# Patient Record
Sex: Male | Born: 1990 | Hispanic: Yes | Smoking: Never smoker
Health system: Southern US, Community
[De-identification: ages and names within clinical notes are randomized; demographics above are authoritative.]

## PROBLEM LIST (undated history)

## (undated) DIAGNOSIS — F32A Depression, unspecified: Secondary | ICD-10-CM

## (undated) DIAGNOSIS — E559 Vitamin D deficiency, unspecified: Secondary | ICD-10-CM

## (undated) DIAGNOSIS — G629 Polyneuropathy, unspecified: Secondary | ICD-10-CM

## (undated) DIAGNOSIS — I10 Essential (primary) hypertension: Secondary | ICD-10-CM

## (undated) DIAGNOSIS — F329 Major depressive disorder, single episode, unspecified: Secondary | ICD-10-CM

## (undated) DIAGNOSIS — E119 Type 2 diabetes mellitus without complications: Secondary | ICD-10-CM

## (undated) DIAGNOSIS — J45909 Unspecified asthma, uncomplicated: Secondary | ICD-10-CM

## (undated) DIAGNOSIS — K219 Gastro-esophageal reflux disease without esophagitis: Secondary | ICD-10-CM

## (undated) DIAGNOSIS — F419 Anxiety disorder, unspecified: Secondary | ICD-10-CM

## (undated) HISTORY — DX: Polyneuropathy, unspecified: G62.9

## (undated) HISTORY — DX: Vitamin D deficiency, unspecified: E55.9

## (undated) HISTORY — DX: Essential (primary) hypertension: I10

## (undated) HISTORY — DX: Anxiety disorder, unspecified: F41.9

## (undated) HISTORY — PX: KNEE ARTHROPLASTY: SHX992

## (undated) HISTORY — PX: TONSILLECTOMY: SUR1361

## (undated) HISTORY — DX: Unspecified asthma, uncomplicated: J45.909

## (undated) HISTORY — DX: Gastro-esophageal reflux disease without esophagitis: K21.9

---
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
## 2011-01-04 ENCOUNTER — Encounter: Payer: Self-pay | Admitting: Unknown Physician Specialty

## 2011-01-06 ENCOUNTER — Encounter: Payer: Self-pay | Admitting: Internal Medicine

## 2011-01-13 NOTE — Letter (Signed)
Summary: INTERNAL TRANSFER FROM [facility]  INTERNAL TRANSFER FROM [facility]   Imported By: Arta Bruce 11/21/2010 11:43:48  _____________________________________________________________________  External Attachment:    Type:   Image     Comment:   External Document

## 2011-01-13 NOTE — Consult Note (Signed)
Summary: [facility]  [facility]   Imported By: Marylou Mccoy 01/22/2010 11:47:19  _____________________________________________________________________  External Attachment:    Type:   Image     Comment:   External Document

## 2011-01-21 NOTE — Medication Information (Signed)
Summary: [facility] / [facility]  [facility] / [facility]   Imported By: Lennie Odor 01/13/2011 15:50:16  _____________________________________________________________________  External Attachment:    Type:   Image     Comment:   External Document

## 2011-03-01 LAB — CK TOTAL AND CKMB (NOT AT ARMC)
CK, MB: 2.6 ng/mL (ref 0.3–4.0)
CK, MB: 3.3 ng/mL (ref 0.3–4.0)
Relative Index: 1 (ref 0.0–2.5)
Relative Index: 1 (ref 0.0–2.5)
Relative Index: 1.2 (ref 0.0–2.5)
Total CK: 219 U/L (ref 7–232)
Total CK: 323 U/L — ABNORMAL HIGH (ref 7–232)
Total CK: 407 U/L — ABNORMAL HIGH (ref 7–232)

## 2011-03-01 LAB — LIPID PANEL
HDL: 42 mg/dL (ref >34)
LDL Cholesterol: 93 mg/dL (ref 0–109)
Total CHOL/HDL Ratio: 3.9 RATIO
Triglycerides: 145 mg/dL (ref <150)
VLDL: 29 mg/dL (ref 0–40)

## 2011-03-01 LAB — DIFFERENTIAL
Basophils Absolute: 0 10*3/uL (ref 0.0–0.1)
Basophils Relative: 0 % (ref 0–1)
Basophils Relative: 1 % (ref 0–1)
Eosinophils Absolute: 0.5 10*3/uL (ref 0.0–1.2)
Eosinophils Relative: 6 % — ABNORMAL HIGH (ref 0–5)
Eosinophils Relative: 7 % — ABNORMAL HIGH (ref 0–5)
Lymphocytes Relative: 32 % (ref 24–48)
Lymphs Abs: 2.5 10*3/uL (ref 1.1–4.8)
Monocytes Absolute: 0.6 10*3/uL (ref 0.2–1.2)
Monocytes Absolute: 0.7 10*3/uL (ref 0.2–1.2)
Monocytes Relative: 6 % (ref 3–11)
Monocytes Relative: 9 % (ref 3–11)
Neutro Abs: 4.1 10*3/uL (ref 1.7–8.0)
Neutro Abs: 7.8 10*3/uL (ref 1.7–8.0)
Neutrophils Relative %: 53 % (ref 43–71)

## 2011-03-01 LAB — CBC
HCT: 41.3 % (ref 36.0–49.0)
HCT: 44.7 % (ref 36.0–49.0)
Hemoglobin: 14.1 g/dL (ref 12.0–16.0)
MCHC: 34.3 g/dL (ref 31.0–37.0)
MCV: 81.5 fL (ref 78.0–98.0)
Platelets: 364 10*3/uL (ref 150–400)
Platelets: 378 10*3/uL (ref 150–400)
RBC: 5.06 MIL/uL (ref 3.80–5.70)
RDW: 13.6 % (ref 11.4–15.5)
RDW: 13.8 % (ref 11.4–15.5)
WBC: 10.8 10*3/uL (ref 4.5–13.5)
WBC: 7.8 10*3/uL (ref 4.5–13.5)

## 2011-03-01 LAB — COMPREHENSIVE METABOLIC PANEL
AST: 31 U/L (ref 0–37)
Albumin: 4.7 g/dL (ref 3.5–5.2)
Alkaline Phosphatase: 86 U/L (ref 52–171)
BUN: 20 mg/dL (ref 6–23)
Chloride: 105 mEq/L (ref 96–112)
Potassium: 4.3 mEq/L (ref 3.5–5.1)
Sodium: 139 mEq/L (ref 135–145)
Total Bilirubin: 1 mg/dL (ref 0.3–1.2)
Total Protein: 7.6 g/dL (ref 6.0–8.3)

## 2011-03-01 LAB — RAPID URINE DRUG SCREEN, HOSP PERFORMED
Amphetamines: NOT DETECTED
Barbiturates: NOT DETECTED
Benzodiazepines: NOT DETECTED
Cocaine: NOT DETECTED
Opiates: NOT DETECTED
Tetrahydrocannabinol: NOT DETECTED

## 2011-03-01 LAB — D-DIMER, QUANTITATIVE: D-Dimer, Quant: <0.22 ug/mL-FEU (ref 0.00–0.48)

## 2011-03-01 LAB — TSH: TSH: 3.613 u[IU]/mL (ref 0.700–6.400)

## 2011-03-01 LAB — TROPONIN I
Troponin I: 0.01 ng/mL (ref 0.00–0.06)
Troponin I: 0.03 ng/mL (ref 0.00–0.06)
Troponin I: 0.05 ng/mL (ref 0.00–0.06)

## 2011-03-15 ENCOUNTER — Emergency Department (HOSPITAL_COMMUNITY)
Admission: EM | Admit: 2011-03-15 | Discharge: 2011-03-15 | Disposition: A | Discharge status: Home / Self Care | Payer: Medicaid Other | Attending: Emergency Medicine | Admitting: Emergency Medicine

## 2011-03-15 ENCOUNTER — Emergency Department (HOSPITAL_COMMUNITY): Payer: Medicaid Other

## 2011-03-15 DIAGNOSIS — Y9361 Activity, american tackle football: Secondary | ICD-10-CM | POA: Insufficient documentation

## 2011-03-15 DIAGNOSIS — X500XXA Overexertion from strenuous movement or load, initial encounter: Secondary | ICD-10-CM | POA: Insufficient documentation

## 2011-03-15 DIAGNOSIS — M25569 Pain in unspecified knee: Secondary | ICD-10-CM | POA: Insufficient documentation

## 2011-03-15 DIAGNOSIS — S83106A Unspecified dislocation of unspecified knee, initial encounter: Secondary | ICD-10-CM | POA: Insufficient documentation

## 2011-03-24 ENCOUNTER — Other Ambulatory Visit (HOSPITAL_COMMUNITY): Payer: Self-pay | Admitting: Orthopedic Surgery

## 2011-03-24 DIAGNOSIS — M25562 Pain in left knee: Secondary | ICD-10-CM

## 2011-03-27 ENCOUNTER — Ambulatory Visit (HOSPITAL_COMMUNITY)
Admission: RE | Admit: 2011-03-27 | Discharge: 2011-03-27 | Disposition: A | Discharge status: Home / Self Care | Payer: Medicaid Other | Source: Ambulatory Visit | Attending: Orthopedic Surgery | Admitting: Orthopedic Surgery

## 2011-03-27 DIAGNOSIS — S83289A Other tear of lateral meniscus, current injury, unspecified knee, initial encounter: Secondary | ICD-10-CM | POA: Insufficient documentation

## 2011-03-27 DIAGNOSIS — S83509A Sprain of unspecified cruciate ligament of unspecified knee, initial encounter: Secondary | ICD-10-CM | POA: Insufficient documentation

## 2011-03-27 DIAGNOSIS — M25469 Effusion, unspecified knee: Secondary | ICD-10-CM | POA: Insufficient documentation

## 2011-03-27 DIAGNOSIS — Y9361 Activity, american tackle football: Secondary | ICD-10-CM | POA: Insufficient documentation

## 2011-03-27 DIAGNOSIS — M25569 Pain in unspecified knee: Secondary | ICD-10-CM | POA: Insufficient documentation

## 2011-03-27 DIAGNOSIS — W219XXA Striking against or struck by unspecified sports equipment, initial encounter: Secondary | ICD-10-CM | POA: Insufficient documentation

## 2011-03-27 DIAGNOSIS — M25562 Pain in left knee: Secondary | ICD-10-CM

## 2011-04-06 ENCOUNTER — Other Ambulatory Visit: Payer: Self-pay | Admitting: Orthopedic Surgery

## 2011-04-06 ENCOUNTER — Ambulatory Visit (HOSPITAL_COMMUNITY)
Admission: RE | Admit: 2011-04-06 | Discharge: 2011-04-06 | Disposition: A | Discharge status: Home / Self Care | Payer: Medicaid Other | Source: Ambulatory Visit | Attending: Orthopedic Surgery | Admitting: Orthopedic Surgery

## 2011-04-06 ENCOUNTER — Encounter (HOSPITAL_COMMUNITY)
Admission: RE | Admit: 2011-04-06 | Discharge: 2011-04-06 | Disposition: A | Discharge status: Home / Self Care | Payer: Medicaid Other | Source: Ambulatory Visit | Attending: Orthopedic Surgery | Admitting: Orthopedic Surgery

## 2011-04-06 DIAGNOSIS — M25562 Pain in left knee: Secondary | ICD-10-CM

## 2011-04-06 DIAGNOSIS — Z01811 Encounter for preprocedural respiratory examination: Secondary | ICD-10-CM | POA: Insufficient documentation

## 2011-04-06 DIAGNOSIS — Z01818 Encounter for other preprocedural examination: Secondary | ICD-10-CM | POA: Insufficient documentation

## 2011-04-06 DIAGNOSIS — M25569 Pain in unspecified knee: Secondary | ICD-10-CM | POA: Insufficient documentation

## 2011-04-06 DIAGNOSIS — Z01812 Encounter for preprocedural laboratory examination: Secondary | ICD-10-CM | POA: Insufficient documentation

## 2011-04-06 LAB — CBC
HCT: 40.2 % (ref 39.0–52.0)
MCHC: 33.6 g/dL (ref 30.0–36.0)
MCV: 81.7 fL (ref 78.0–100.0)
Platelets: 367 10*3/uL (ref 150–400)
RDW: 12.9 % (ref 11.5–15.5)
WBC: 8.6 10*3/uL (ref 4.0–10.5)

## 2011-04-06 LAB — BASIC METABOLIC PANEL
BUN: 18 mg/dL (ref 6–23)
CO2: 29 mEq/L (ref 19–32)
Chloride: 107 mEq/L (ref 96–112)
Creatinine, Ser: 1.11 mg/dL (ref 0.4–1.5)
Glucose, Bld: 120 mg/dL — ABNORMAL HIGH (ref 70–99)
Potassium: 4.5 mEq/L (ref 3.5–5.1)

## 2011-04-06 LAB — APTT: aPTT: 30 seconds (ref 24–37)

## 2011-04-06 LAB — SURGICAL PCR SCREEN
MRSA, PCR: NEGATIVE
Staphylococcus aureus: NEGATIVE

## 2011-04-06 LAB — TYPE AND SCREEN: ABO/RH(D): O POS

## 2011-04-06 LAB — ABO/RH: ABO/RH(D): O POS

## 2011-04-07 ENCOUNTER — Observation Stay (HOSPITAL_COMMUNITY)
Admission: RE | Admit: 2011-04-07 | Discharge: 2011-04-09 | Disposition: A | Discharge status: Home / Self Care | Payer: Medicaid Other | Source: Ambulatory Visit | Attending: Orthopedic Surgery | Admitting: Orthopedic Surgery

## 2011-04-07 DIAGNOSIS — S83509A Sprain of unspecified cruciate ligament of unspecified knee, initial encounter: Principal | ICD-10-CM | POA: Insufficient documentation

## 2011-04-07 DIAGNOSIS — X58XXXA Exposure to other specified factors, initial encounter: Secondary | ICD-10-CM | POA: Insufficient documentation

## 2011-04-07 DIAGNOSIS — M2240 Chondromalacia patellae, unspecified knee: Secondary | ICD-10-CM | POA: Insufficient documentation

## 2011-04-07 DIAGNOSIS — S83289A Other tear of lateral meniscus, current injury, unspecified knee, initial encounter: Secondary | ICD-10-CM | POA: Insufficient documentation

## 2011-04-18 ENCOUNTER — Emergency Department (HOSPITAL_COMMUNITY)
Admission: EM | Admit: 2011-04-18 | Discharge: 2011-04-19 | Disposition: A | Discharge status: Home / Self Care | Payer: Medicaid Other | Attending: Emergency Medicine | Admitting: Emergency Medicine

## 2011-04-18 DIAGNOSIS — F29 Unspecified psychosis not due to a substance or known physiological condition: Secondary | ICD-10-CM | POA: Insufficient documentation

## 2011-04-18 LAB — COMPREHENSIVE METABOLIC PANEL
ALT: 46 U/L (ref 0–53)
Albumin: 4 g/dL (ref 3.5–5.2)
Alkaline Phosphatase: 104 U/L (ref 39–117)
Calcium: 9.1 mg/dL (ref 8.4–10.5)
GFR calc Af Amer: >60 mL/min (ref >60)
Glucose, Bld: 153 mg/dL — ABNORMAL HIGH (ref 70–99)
Potassium: 4 mEq/L (ref 3.5–5.1)
Sodium: 139 mEq/L (ref 135–145)
Total Protein: 6.9 g/dL (ref 6.0–8.3)

## 2011-04-18 LAB — DIFFERENTIAL
Eosinophils Absolute: 0.6 10*3/uL (ref 0.0–0.7)
Eosinophils Relative: 8 % — ABNORMAL HIGH (ref 0–5)
Lymphocytes Relative: 40 % (ref 12–46)
Lymphs Abs: 2.7 10*3/uL (ref 0.7–4.0)
Monocytes Relative: 9 % (ref 3–12)

## 2011-04-18 LAB — CBC
HCT: 38.4 % — ABNORMAL LOW (ref 39.0–52.0)
MCH: 26.7 pg (ref 26.0–34.0)
MCV: 81.4 fL (ref 78.0–100.0)
Platelets: 435 10*3/uL — ABNORMAL HIGH (ref 150–400)
RBC: 4.72 MIL/uL (ref 4.22–5.81)
RDW: 12.5 % (ref 11.5–15.5)
WBC: 6.8 10*3/uL (ref 4.0–10.5)

## 2011-04-18 LAB — ETHANOL: Alcohol, Ethyl (B): <11 mg/dL — ABNORMAL HIGH (ref 0–10)

## 2011-04-19 LAB — RAPID URINE DRUG SCREEN, HOSP PERFORMED
Benzodiazepines: NOT DETECTED
Cocaine: NOT DETECTED
Tetrahydrocannabinol: NOT DETECTED

## 2011-04-21 NOTE — Op Note (Signed)
NAME:  Ernest Mccann, Ernest Mccann NO.:  [id number]  MEDICAL RECORD NO.:  [id number]           PATIENT TYPE:  I  LOCATION:  5005                         FACILITY:  [facility]  PHYSICIAN:  Burnard Bunting, M.D.    DATE OF BIRTH:  Sep 19, 1991  DATE OF PROCEDURE:  04/07/2011 DATE OF DISCHARGE:  04/06/2011                              OPERATIVE REPORT   PREOPERATIVE DIAGNOSES:  Left knee anterior cruciate ligament tear, medial femoral condyle, chondromalacia, lateral meniscal tear.  POSTOPERATIVE DIAGNOSES:  Left knee anterior cruciate ligament tear, medial femoral condyle, chondromalacia, lateral meniscal tear.  PROCEDURE: 1. Left knee ACL reconstruction with hamstring and autograft. 2. Chondroplasty medial femoral condyle. 3. Near complete partial lateral meniscectomy.  SURGEON:  Burnard Bunting, MD  ASSISTANT:  Wende Neighbors, PA  ANESTHESIA:  General endotracheal.  ESTIMATED BLOOD LOSS:  Minimal.  INDICATIONS:  Ernest Mccann is a 20 year old patient with left knee pain following an injury.  He presents now for operative management of ACL deficiency and symptomatic instability after explanation of risks and benefits.  OPERATIVE FINDINGS: 1. Examination under anesthesia range of motion 5-135 degrees of     flexion.  The patient noted stability to varus and valgus stress at     0 and 30 degrees.  ACL without posterolateral rotatory instability     is noted. 2. Diagnostic arthroscopy.     a.     No loose bodies in medial lateral gutter.     b.     Intact patellofemoral joint. 3. Torn ACL, intact PCL. 4. Chondromalacia with loose chondral flaps, grade 3 on the medial     femoral condyle over about a dime sized area. 5. Chondromalacia also on the lateral femoral condyle with no loose     chondral flaps but with a significant tear of the posterior and     lateral meniscus involving 90% anterior-posterior width of the     meniscus posterior to the popliteus  tendon.  PROCEDURE IN DETAIL:  The patient was brought to operating room where general endotracheal anesthesia was induced.  Preoperative IV antibiotics were administered.  Time-out was called.  Left leg, knee, and foot was prepped with DuraPrep solution and draped in sterile manner.  Knee was prescrubbed with alcohol and Betadine which allowed to air dry and then prepped and then draped and an [place] was used to cover the operative field.  Leg was elevated and exsanguinated with an Esmarch wrap.  Tourniquet was inflated.  Incision was made from the medial aspect of the tibial tubercle extending about 3 cm.  Skin and subcutaneous tissues were sharply divided.  The semitendinosus and gracilis tendons were identified and harvested.  Care was taken to avoid injury to the saphenous nerve which was visualized.  Following harvest with the tendon harvest, Maud Deed prepared the graft on the back table to a length of 205.  Meanwhile this incision was thoroughly irrigated and packed with a wet sponge.  Anterior-inferior medial port established.  Diagnostic arthroscopy was performed.  ACL was torn. Chondral flaps on the medial femoral condyle were debrided.  There was no full-thickness chondral defect on  the medial femoral condyle.  Medial meniscus was intact.  No peripheral tear was noted upon probing and visualizing the posterior compartment.  ACL was torn.  It was debrided. Notchplasty was performed.  Over-the-top position was identified.  ACL stump was also debrided.  This was blocking in extension.  The lateral meniscus was visualized, has significant tear on the posterior portion, irreparable.  This was debrided back to stable rim using combination basket punch, shaver, and portal reversal.  All-in-all about 90% of the anterior-posterior width of the meniscus was involved focally in this tear over 1.5 cm area.  Following meniscal debridement and notchplasty and identification of the notch  landmarks, the flip cutter was used to drill the femoral tunnel and at about the 3 o'clock position on the lateral femoral condyle, 8.5 flip cutter was chosen.  Tibia was then also drilled with the flip cutter 8.5 mm and then subsequently dilated as well.  The graft was then passed and secured with the button on the femoral side.  Then over post with the knee in 10 degrees of extension on the tibia.  The patient had excellent stability of the knee. Following fixation, knee joint was then thoroughly irrigated. Tourniquet was released after about half an hour and 40 minutes. Bleeding points encountered, controlled using ArthroCare 1.  The instruments were then removed after thorough irrigation.  Harvest site incision was closed using 0 Vicryl suture, 2-0 Vicryl suture, and running 3-0 pullout Prolene.  Portal incisions were closed using 3-0 nylon suture.  Solution of Marcaine and morphine finally injected into the knee for postop pain control.  Bulky dressing, ice wrap, and knee immobilizer was placed.  The patient tolerated the procedure well.  He was transferred to the recovery room in stable condition.  [name]'s assistance was required all times during the case for opening and closing, graft preparation, graft passage, limb positioning, retraction of important neurovascular structures.  Her assistance was a medical necessity.     Burnard Bunting, M.D.     GSD/MEDQ  D:  04/07/2011  T:  04/08/2011  Job:  [id number]  Electronically Signed by Reece Agar.  Shandrika Ambers M.D. on 04/21/2011 01:31:41 PM

## 2011-04-22 NOTE — Discharge Summary (Signed)
  NAME:  Ernest Mccann, Ernest Mccann           ACCOUNT NO.:  [id number]  MEDICAL RECORD NO.:  [id number]           PATIENT TYPE:  O  LOCATION:  5005                         FACILITY:  [facility]  PHYSICIAN:  Burnard Bunting, M.D.    DATE OF BIRTH:  1991/09/01  DATE OF ADMISSION:  04/07/2011 DATE OF DISCHARGE:  04/09/2011                              DISCHARGE SUMMARY   DISCHARGE DIAGNOSIS:  Left knee anterior cruciate ligament tear, meniscal tears  OPERATIONS AND NOTABLE PROCEDURES:  Left knee ACL reconstruction with partial medial lateral meniscectomy on performed April 07, 2011.  HOSPITAL COURSE:  Ernest Mccann is a 20 year old patient who underwent left knee ACL reconstruction.  He tolerated the procedure well.  He was just slow to mobilize on postop day #1 and had pain control issues.  IV pain medicine was maintained during postop day #1, discontinued on postop day #2.  Started on aspirin for DVT prophylaxis, was mobilizing physical therapy by the time of discharge.  CPM was initiated in the hospital.  Physical therapy was initiated in the hospital, had an otherwise unremarkable recovery.  Incision was intact on postop day #2.  He is discharged home in good condition, weightbearing as tolerated in left lower extremity with crutches.  He will have CPM machine at home as well as home health physical therapy. Discharge medications include aspirin 325 mg p.o. daily, Percocet 5/325 one to two p.o. q.3-4 h. p.r.n. pain, Robaxin 500 mg p.o. q.8 h. p.r.n. spasm.  He will follow up with me in 7 days.     Burnard Bunting, M.D.     GSD/MEDQ  D:  04/21/2011  T:  04/22/2011  Job:  [id number]  Electronically Signed by Reece Agar.  Younique Casad M.D. on 04/22/2011 01:07:14 PM

## 2011-05-20 ENCOUNTER — Ambulatory Visit: Payer: Medicaid Other | Attending: Orthopedic Surgery | Admitting: Physical Therapy

## 2011-05-20 DIAGNOSIS — R262 Difficulty in walking, not elsewhere classified: Secondary | ICD-10-CM | POA: Insufficient documentation

## 2011-05-20 DIAGNOSIS — R5381 Other malaise: Secondary | ICD-10-CM | POA: Insufficient documentation

## 2011-05-20 DIAGNOSIS — M25569 Pain in unspecified knee: Secondary | ICD-10-CM | POA: Insufficient documentation

## 2011-05-20 DIAGNOSIS — IMO0001 Reserved for inherently not codable concepts without codable children: Secondary | ICD-10-CM | POA: Insufficient documentation

## 2011-05-20 DIAGNOSIS — M25669 Stiffness of unspecified knee, not elsewhere classified: Secondary | ICD-10-CM | POA: Insufficient documentation

## 2011-06-01 ENCOUNTER — Ambulatory Visit: Payer: Medicaid Other | Admitting: Physical Therapy

## 2011-06-03 ENCOUNTER — Ambulatory Visit: Payer: Medicaid Other | Admitting: Physical Therapy

## 2011-06-08 ENCOUNTER — Ambulatory Visit: Payer: Medicaid Other | Admitting: Physical Therapy

## 2011-06-10 ENCOUNTER — Ambulatory Visit: Payer: Medicaid Other | Admitting: Physical Therapy

## 2011-06-14 ENCOUNTER — Emergency Department (HOSPITAL_COMMUNITY)
Admission: EM | Admit: 2011-06-14 | Discharge: 2011-06-14 | Disposition: A | Discharge status: Home / Self Care | Payer: Medicaid Other | Attending: Emergency Medicine | Admitting: Emergency Medicine

## 2011-06-14 ENCOUNTER — Emergency Department (HOSPITAL_COMMUNITY): Payer: Medicaid Other

## 2011-06-14 DIAGNOSIS — J3489 Other specified disorders of nose and nasal sinuses: Secondary | ICD-10-CM | POA: Insufficient documentation

## 2011-06-14 DIAGNOSIS — H9209 Otalgia, unspecified ear: Secondary | ICD-10-CM | POA: Insufficient documentation

## 2011-06-14 DIAGNOSIS — IMO0001 Reserved for inherently not codable concepts without codable children: Secondary | ICD-10-CM | POA: Insufficient documentation

## 2011-06-14 DIAGNOSIS — R51 Headache: Secondary | ICD-10-CM | POA: Insufficient documentation

## 2011-06-14 DIAGNOSIS — J40 Bronchitis, not specified as acute or chronic: Secondary | ICD-10-CM | POA: Insufficient documentation

## 2011-06-14 DIAGNOSIS — R05 Cough: Secondary | ICD-10-CM | POA: Insufficient documentation

## 2011-06-14 DIAGNOSIS — R5381 Other malaise: Secondary | ICD-10-CM | POA: Insufficient documentation

## 2011-06-14 DIAGNOSIS — R059 Cough, unspecified: Secondary | ICD-10-CM | POA: Insufficient documentation

## 2011-06-24 ENCOUNTER — Ambulatory Visit: Payer: Medicaid Other | Admitting: Physical Therapy

## 2011-06-25 ENCOUNTER — Encounter: Payer: Medicaid Other | Admitting: Physical Therapy

## 2011-06-29 ENCOUNTER — Ambulatory Visit: Payer: Medicaid Other | Attending: Orthopedic Surgery | Admitting: Physical Therapy

## 2011-06-29 DIAGNOSIS — IMO0001 Reserved for inherently not codable concepts without codable children: Secondary | ICD-10-CM | POA: Insufficient documentation

## 2011-06-29 DIAGNOSIS — M25569 Pain in unspecified knee: Secondary | ICD-10-CM | POA: Insufficient documentation

## 2011-06-29 DIAGNOSIS — R5381 Other malaise: Secondary | ICD-10-CM | POA: Insufficient documentation

## 2011-06-29 DIAGNOSIS — R262 Difficulty in walking, not elsewhere classified: Secondary | ICD-10-CM | POA: Insufficient documentation

## 2011-06-29 DIAGNOSIS — M25669 Stiffness of unspecified knee, not elsewhere classified: Secondary | ICD-10-CM | POA: Insufficient documentation

## 2011-07-01 ENCOUNTER — Encounter: Payer: Medicaid Other | Admitting: Physical Therapy

## 2011-09-07 LAB — POCT URINALYSIS DIP (DEVICE)
Glucose, UA: NEGATIVE
Operator id: 208841
Protein, ur: NEGATIVE
Urobilinogen, UA: 0.2

## 2011-10-22 IMAGING — CT CT ANGIO CHEST
2 of 6 series · 19 of 36 positions shown · IV contrast (APPLIED)
Comparison: None.

CLINICAL DATA: Left-sided chest pain and shortness of breath.

CT ANGIOGRAPHY CHEST WITH CONTRAST
TECHNIQUE: Multidetector CT imaging of the chest was performed
using the standard protocol during bolus administration of
intravenous contrast.  Multiplanar CT image reconstructions
including MIPs were obtained to evaluate the vascular anatomy.
Contrast:  100 ml Omnipaque 300

[Series 9: pulm embolism 1.0 b25f thins · axial · 0.79mm/px · z∈[+349,+577]mm · 18 of 254 slices shown]
[im 13/254  lung]
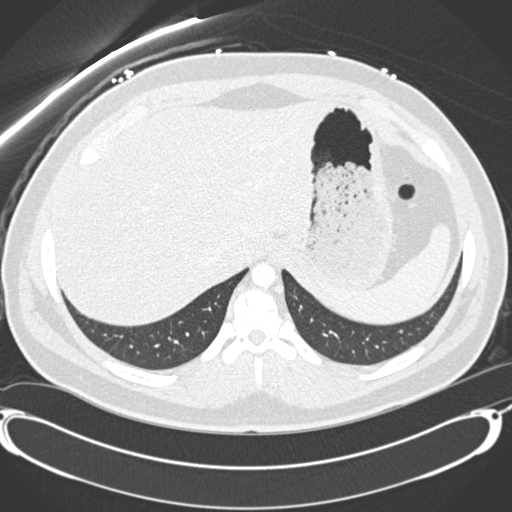
[im 26/254  mediastinal]
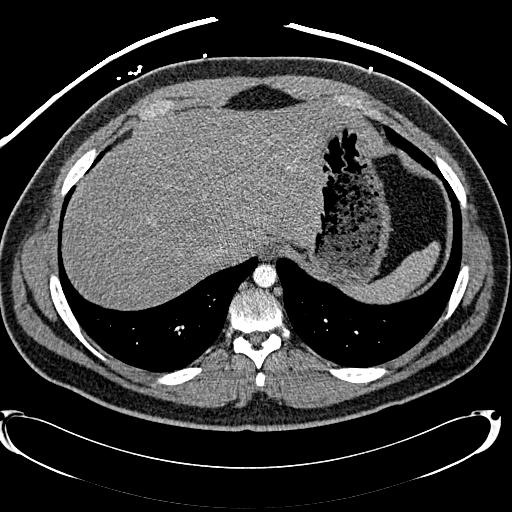
[im 38/254  lung]
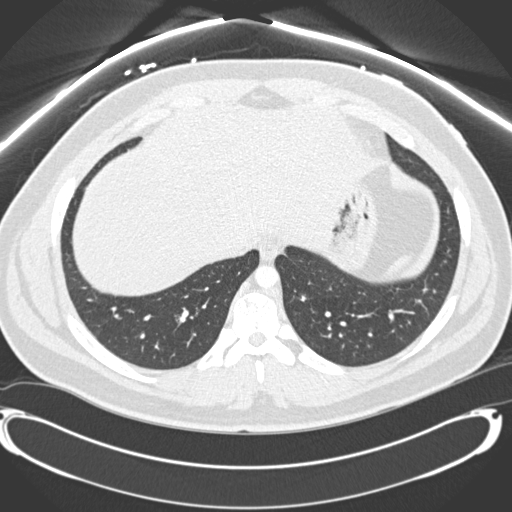
[im 51/254  mediastinal]
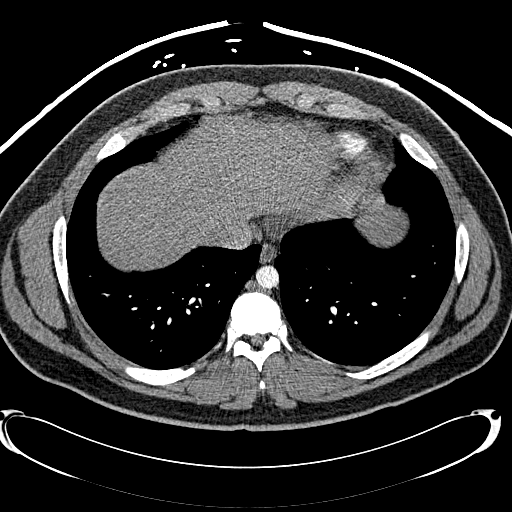
[im 64/254  lung]
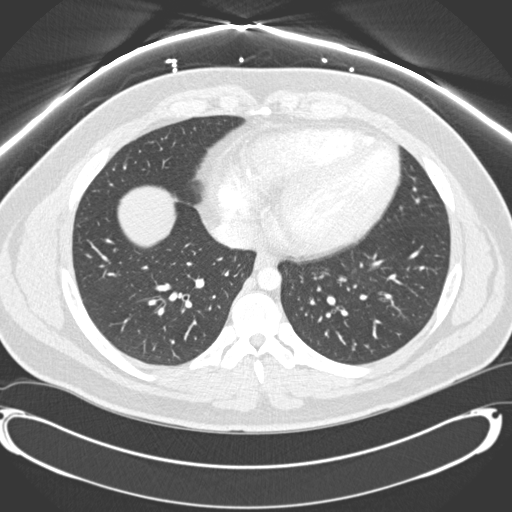
[im 76/254  mediastinal]
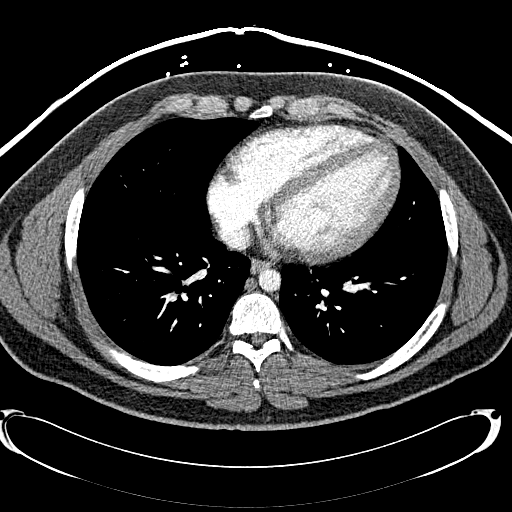
[im 89/254  lung]
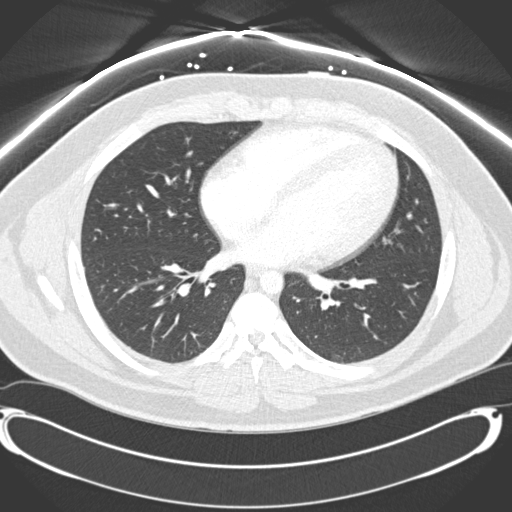
[im 102/254  mediastinal]
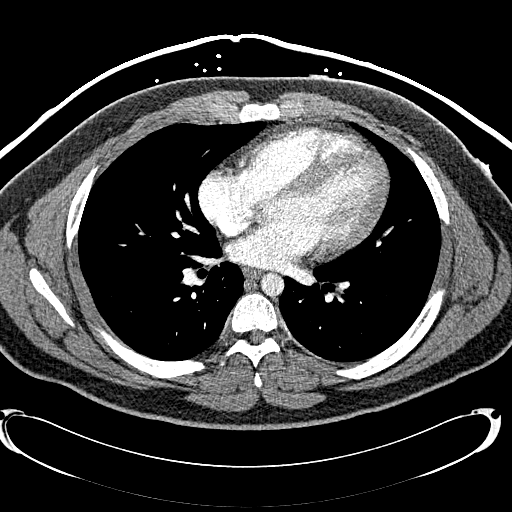
[im 114/254  lung]
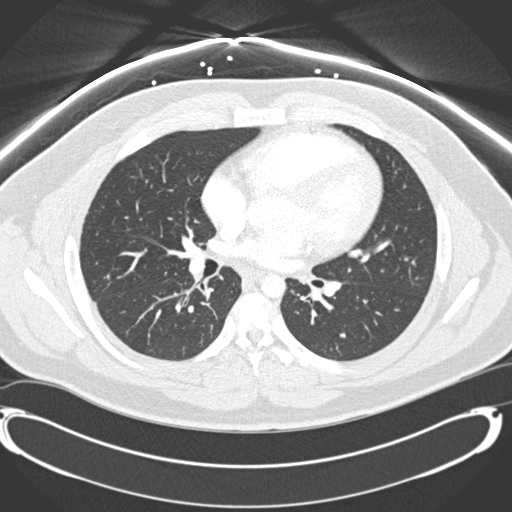
[im 140/254  mediastinal]
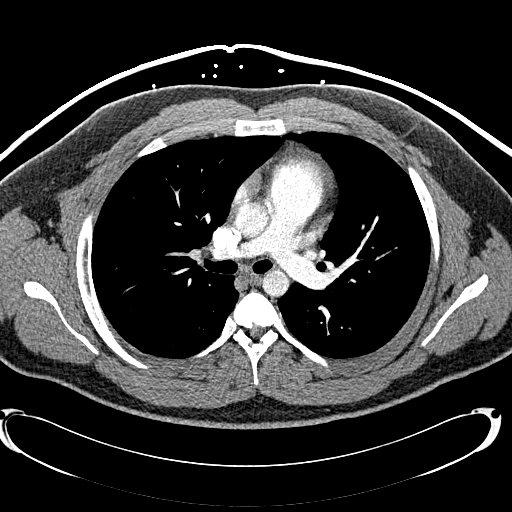
[im 152/254  lung]
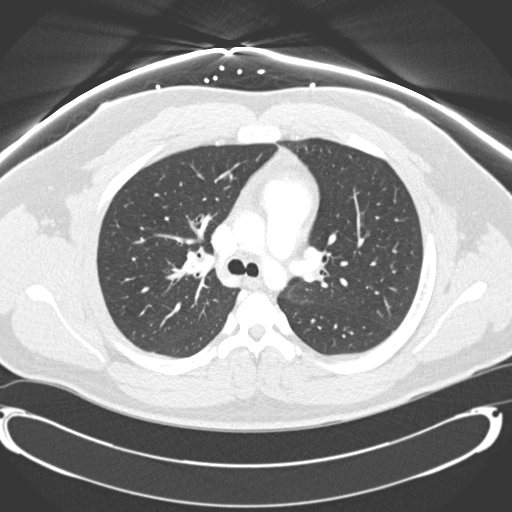
[im 165/254  mediastinal]
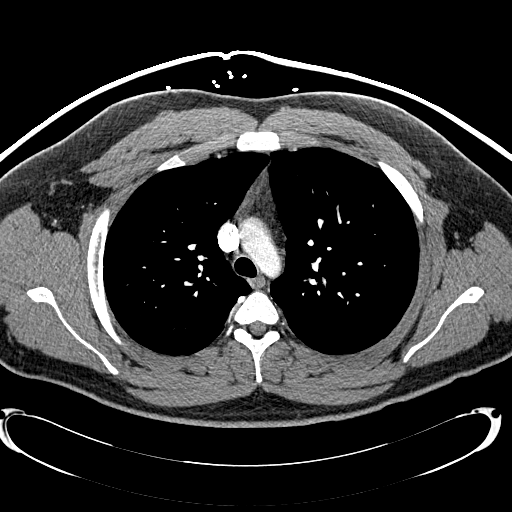
[im 178/254  lung]
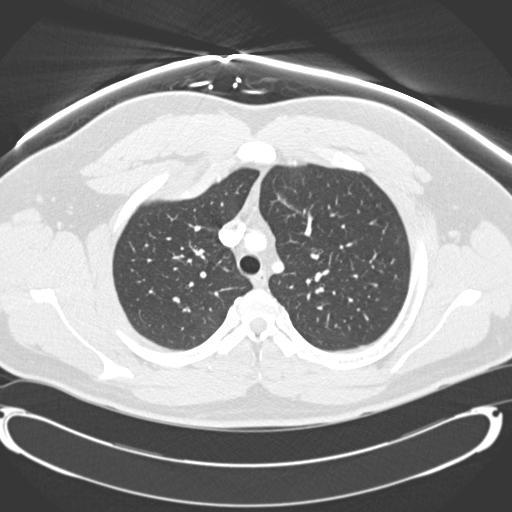
[im 190/254  mediastinal]
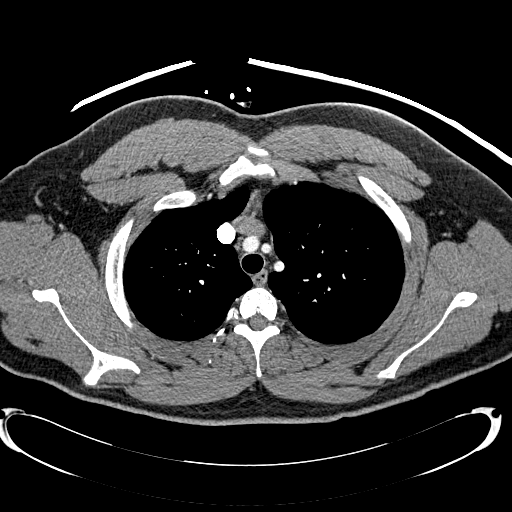
[im 203/254  lung]
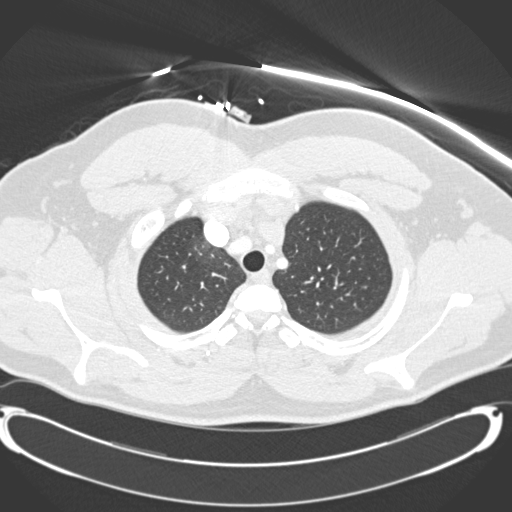
[im 216/254  mediastinal]
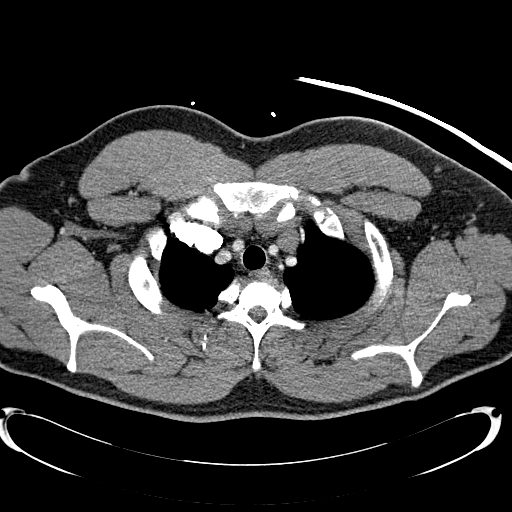
[im 228/254  lung]
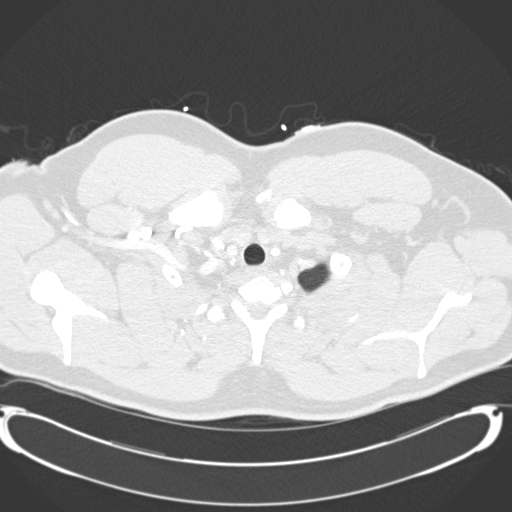
[im 241/254  mediastinal]
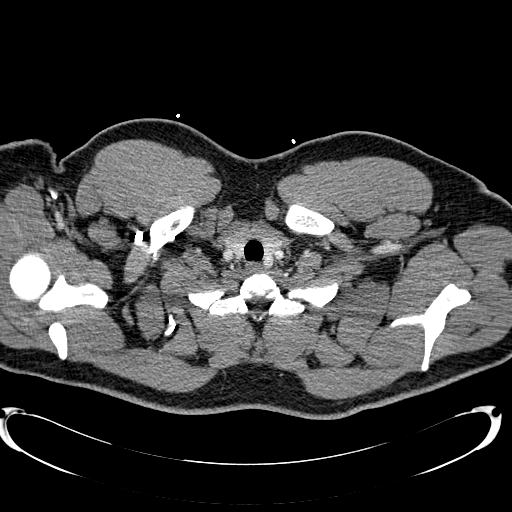

[Series 602: coronal · coronal · 0.79mm/px · 1 of 128 slices shown]
[im 64/128  mediastinal]
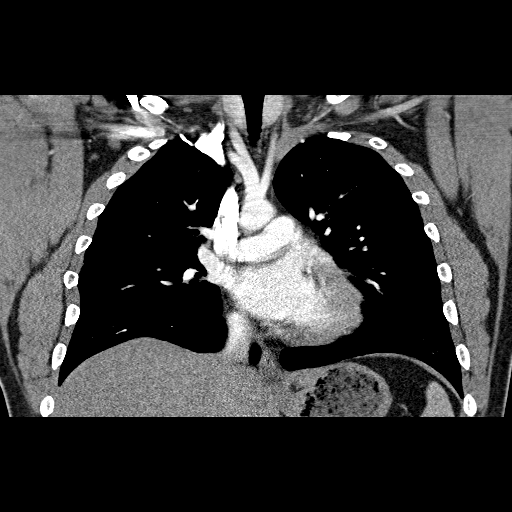

[19 of 36 positions shown; findings below may reference images not displayed]

FINDINGS: There is no evidence of thoracic aortic aneurysm or
dissection. Satisfactory opacification of the pulmonary arteries
noted, and there is no evidence of pulmonary emboli.

There is no evidence of hilar mediastinal masses.  No adenopathy
seen elsewhere within the thorax.  There is no evidence of pleural
or pericardial effusion.  Both lungs are clear.

Review of the MIP images confirms the above findings.
IMPRESSION: Negative.  No evidence of the embolism, thoracic aortic dissection,
or other acute findings.

## 2011-10-22 IMAGING — CT CT HEAD W/O CM
1 series · 16 of 30 positions shown, 20 images · non-contrast
Comparison: 03/01/2007

CLINICAL DATA: Headache.  Dizziness.  Syncope.

CT HEAD WITHOUT CONTRAST
TECHNIQUE: Contiguous axial images were obtained from the base of
the skull through the vertex without contrast

[Series 2: head routine 4.8 h37s · axial · 0.50mm/px · z∈[+921,+1054]mm · 16 of 30 slices shown, 20 images]
[im 2/30  brain]
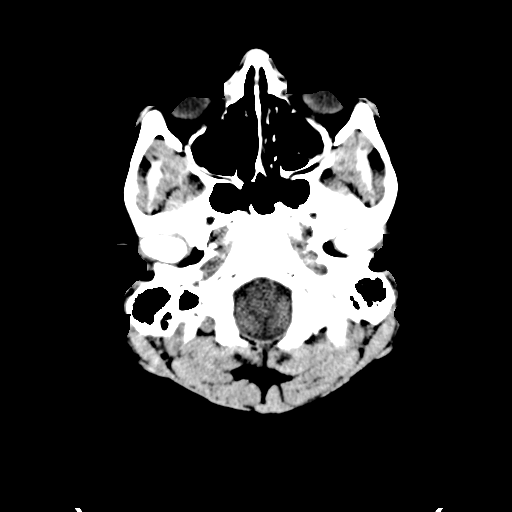
[im 2/30  bone]
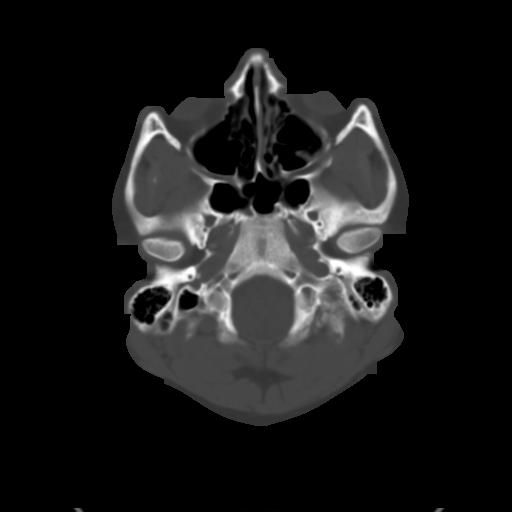
[im 4/30  brain]
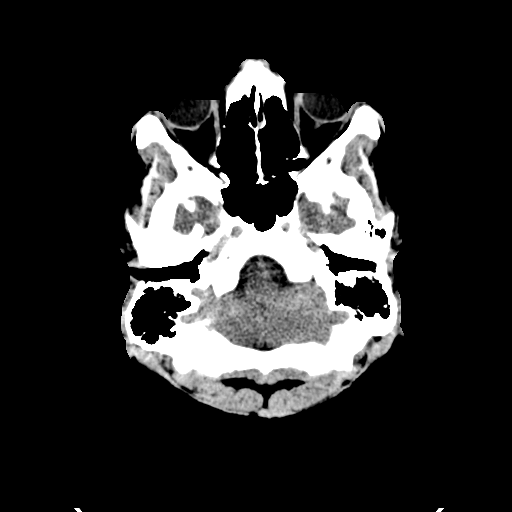
[im 6/30  brain]
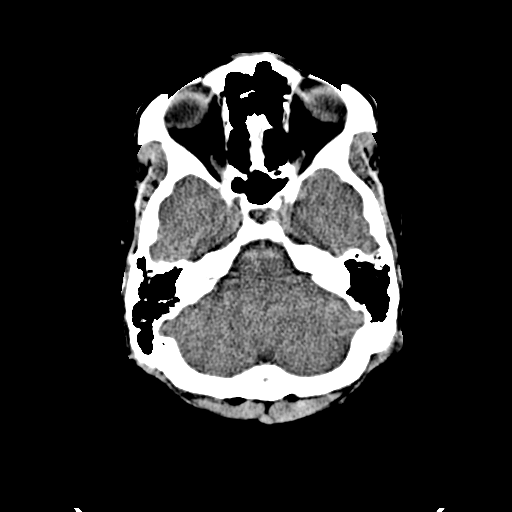
[im 8/30  brain]
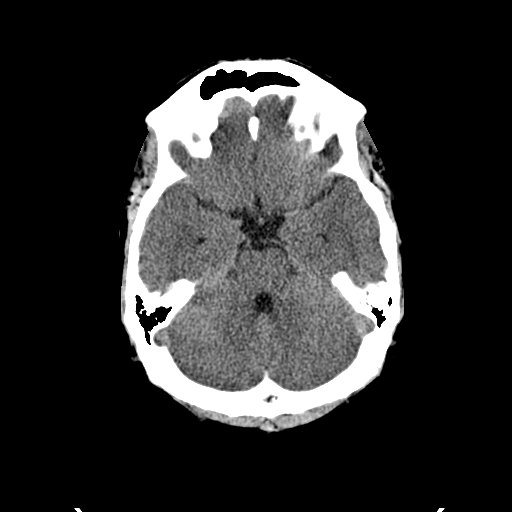
[im 9/30  brain]
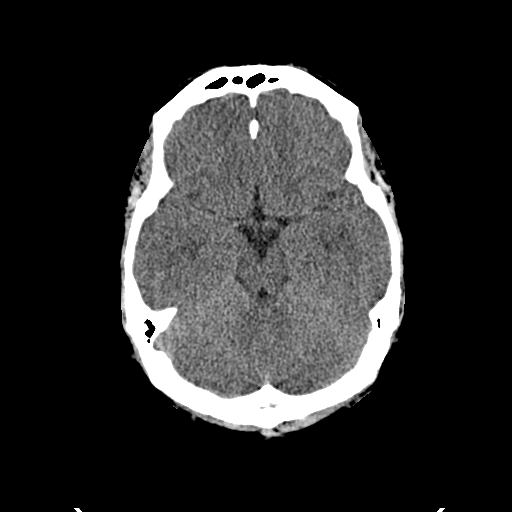
[im 9/30  bone]
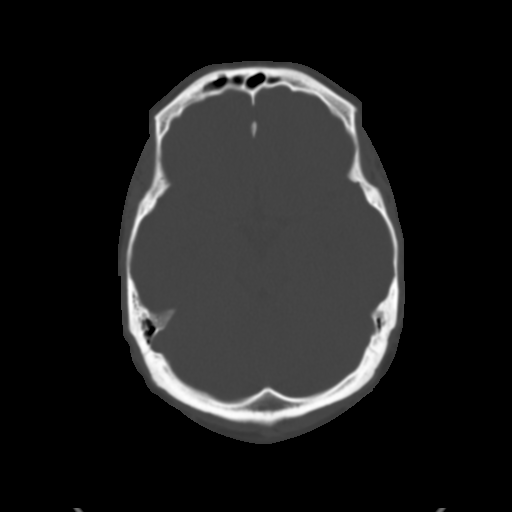
[im 11/30  brain]
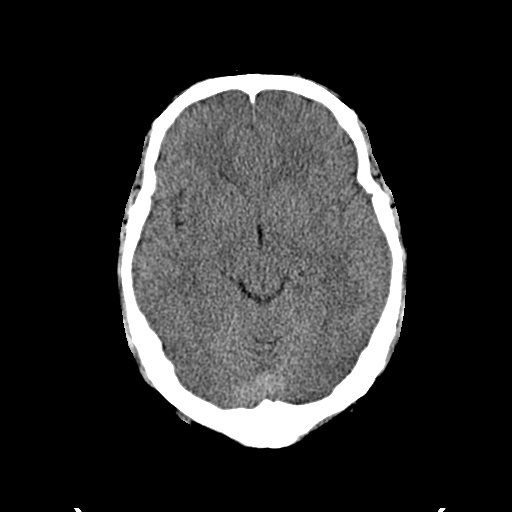
[im 13/30  brain]
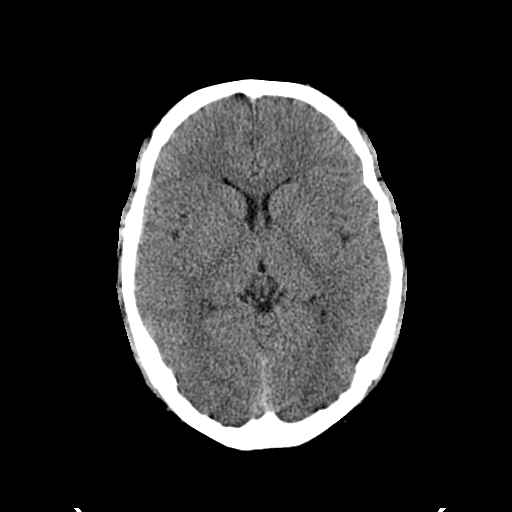
[im 15/30  brain]
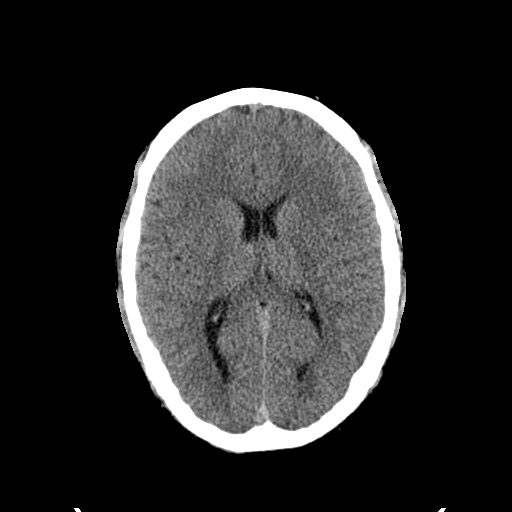
[im 16/30  brain]
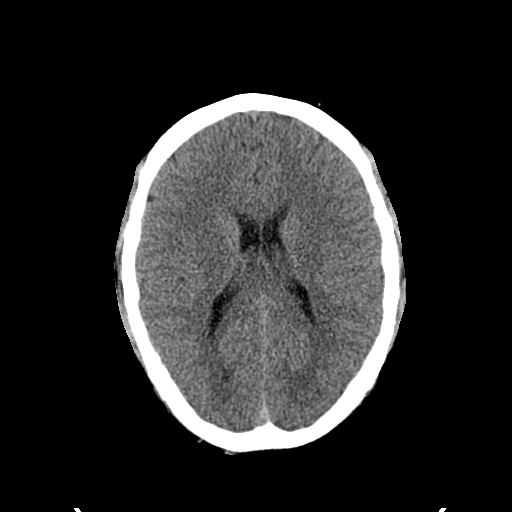
[im 16/30  bone]
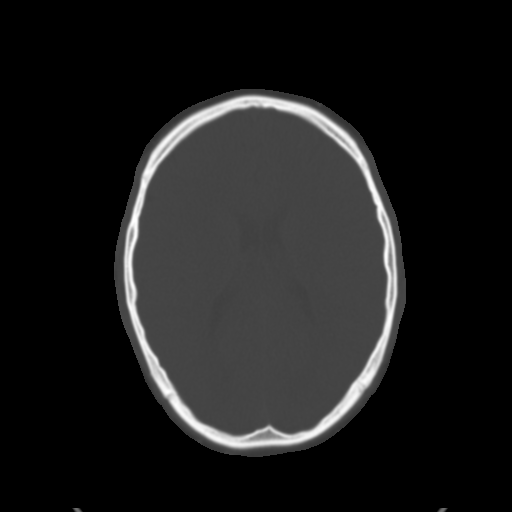
[im 18/30  brain]
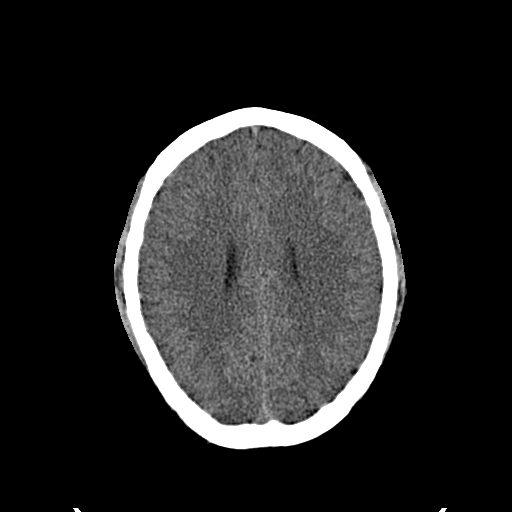
[im 20/30  brain]
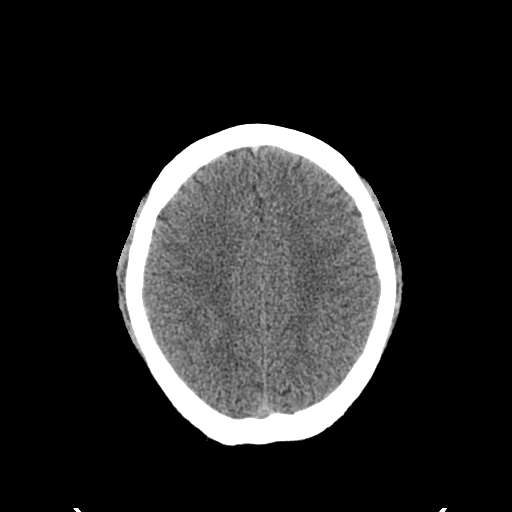
[im 22/30  brain]
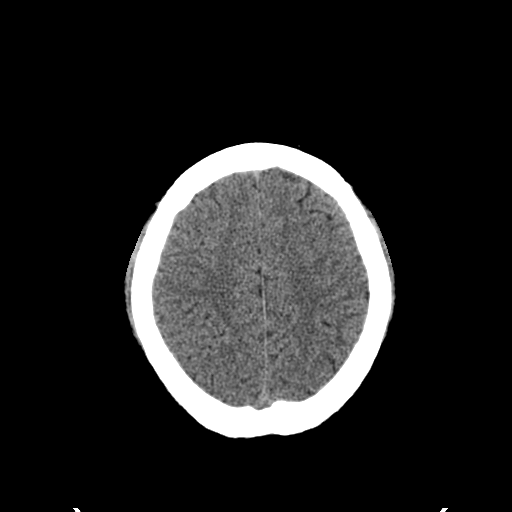
[im 23/30  brain]
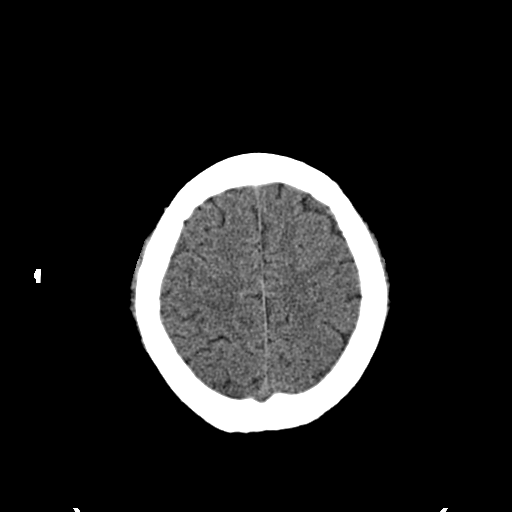
[im 23/30  bone]
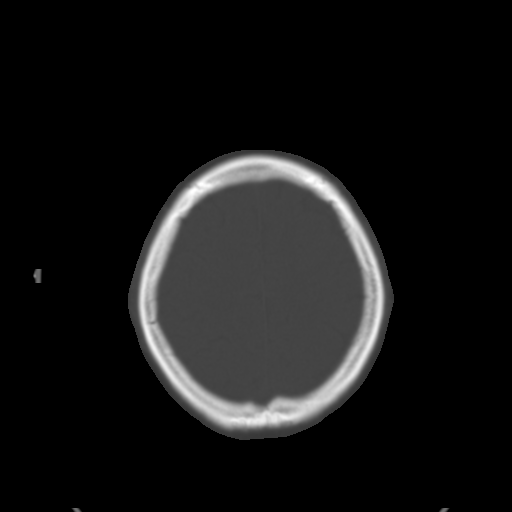
[im 25/30  brain]
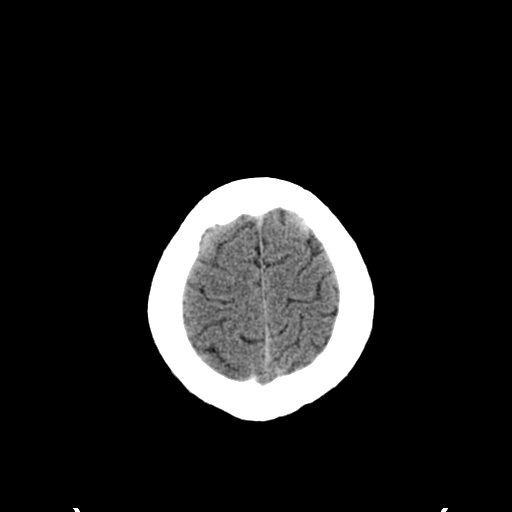
[im 27/30  brain]
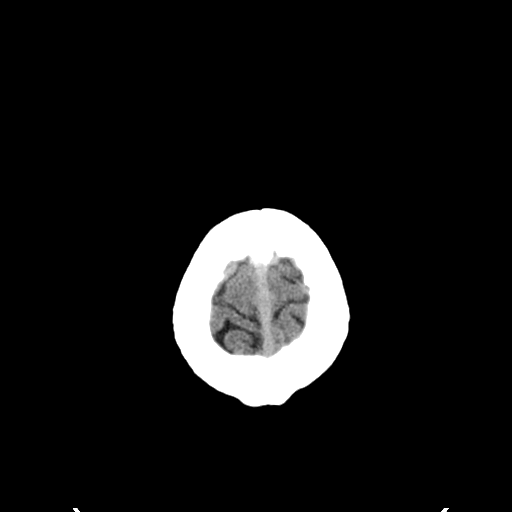
[im 29/30  brain]
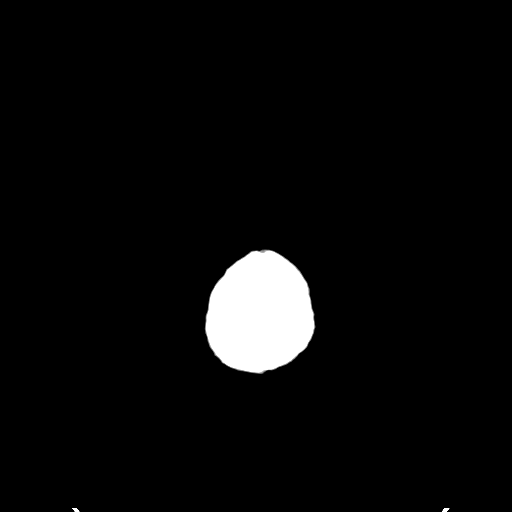

[16 of 30 positions shown; findings below may reference images not displayed]

FINDINGS: There is no evidence of intracranial hemorrhage, brain
edema, or other signs of acute infarction.  There is no evidence of
intracranial mass lesion or mass effect.  No abnormal extraaxial
fluid collections are identified.  There is no evidence of
hydrocephalus, or other significant intracranial abnormality.  No
skull abnormality identified.
IMPRESSION: Negative non-contrast head CT.

## 2012-03-08 ENCOUNTER — Emergency Department (HOSPITAL_COMMUNITY)
Admission: EM | Admit: 2012-03-08 | Discharge: 2012-03-08 | Disposition: A | Discharge status: Home / Self Care | Payer: Self-pay | Attending: Emergency Medicine | Admitting: Emergency Medicine

## 2012-03-08 ENCOUNTER — Encounter (HOSPITAL_COMMUNITY): Payer: Self-pay | Admitting: Emergency Medicine

## 2012-03-08 DIAGNOSIS — I10 Essential (primary) hypertension: Secondary | ICD-10-CM | POA: Insufficient documentation

## 2012-03-08 DIAGNOSIS — T7840XA Allergy, unspecified, initial encounter: Secondary | ICD-10-CM | POA: Insufficient documentation

## 2012-03-08 LAB — GLUCOSE, CAPILLARY: Glucose-Capillary: 264 mg/dL — ABNORMAL HIGH (ref 70–99)

## 2012-03-08 MED ORDER — PREDNISONE 20 MG PO TABS
60.0000 mg | ORAL_TABLET | Freq: Once | ORAL | Status: AC
  Administered 2012-03-08: 60 mg via ORAL
  Filled 2012-03-08: qty 2
  Filled 2012-03-08: qty 1

## 2012-03-08 MED ORDER — FAMOTIDINE 20 MG PO TABS
20.0000 mg | ORAL_TABLET | Freq: Once | ORAL | Status: AC
  Administered 2012-03-08: 20 mg via ORAL
  Filled 2012-03-08: qty 1

## 2012-03-08 MED ORDER — PREDNISONE 10 MG PO TABS: 20.0000 mg | ORAL_TABLET | Freq: Every day | ORAL | Status: DC

## 2012-03-08 MED ORDER — FAMOTIDINE 20 MG PO TABS: 20.0000 mg | ORAL_TABLET | Freq: Two times a day (BID) | ORAL | Status: AC

## 2012-03-08 NOTE — ED Provider Notes (Signed)
History     CSN: [id number]  Arrival date & time 03/08/12  2019   First MD Initiated Contact with Patient 03/08/12 2128      Chief Complaint  Patient presents with  . Facial Swelling    (Consider location/radiation/quality/duration/timing/severity/associated sxs/prior treatment) HPI Comments: Patient states that on Sunday.  He was in a was playing paint ball he noticed on Monday morning that his left upper eyelid was red and itchy.  This has progressed through the past 2 days becoming worse now involving the entire periorbital area on the left bridge of the nose.  The lower periorbital area on the right.  He also has noted.  Several areas in the posterior knee area that is red and itchy as well.  He has taken Benadryl on several occasions with some resolution of the itch.  Denies shortness of breath, tachycardia, abdominal pain, visual disturbances  The history is provided by the patient.    No past medical history on file.  Past Surgical History  Procedure Date  . Tonsillectomy   . Knee arthroplasty     History reviewed. No pertinent family history.  History  Substance Use Topics  . Smoking status: Never Smoker   . Smokeless tobacco: Not on file  . Alcohol Use: No      Review of Systems  Constitutional: Negative for fever.  HENT: Negative for rhinorrhea and trouble swallowing.   Eyes: Negative for visual disturbance.  Musculoskeletal: Negative for myalgias.  Skin: Positive for rash.  Neurological: Negative for dizziness, weakness and headaches.    Allergies  Review of patient's allergies indicates no known allergies.  Home Medications   Current Outpatient Rx  Name Route Sig Dispense Refill  . DIPHENHYDRAMINE HCL 25 MG PO TABS Oral Take 50 mg by mouth every 6 (six) hours as needed. For allergic reaction.    Marland Kitchen FAMOTIDINE 20 MG PO TABS Oral Take 1 tablet (20 mg total) by mouth 2 (two) times daily. 30 tablet 0  . PREDNISONE 10 MG PO TABS Oral Take 2 tablets (20  mg total) by mouth daily. 15 tablet 0    BP 167/72  Pulse 79  Temp(Src) 98.4 F (36.9 C) (Oral)  Resp 20  Ht 6\' 1"  (1.854 m)  Wt 275 lb (124.739 kg)  BMI 36.28 kg/m2  SpO2 97%  Physical Exam  Constitutional: He appears well-developed.  HENT:  Head: Macrocephalic.  Eyes: Pupils are equal, round, and reactive to light. Right conjunctiva is not injected. Right eye exhibits normal extraocular motion and no nystagmus.  Neck: Normal range of motion.  Cardiovascular: Normal rate.   Pulmonary/Chest: Effort normal.  Musculoskeletal: Normal range of motion.  Neurological: He is alert.  Skin: Rash noted.    ED Course  Procedures (including critical care time)  Labs Reviewed - No data to display No results found.   1. Allergic reaction       MDM  Is most likely, allergic reaction.  We'll administer steroids and Pepcid in the department in continued to observe patient  Approximately one hour.  The patient received meds the erythema is definitely decreasing, as well as the swelling will discharge patient home with the diagnosis of allergic reaction with steroids and Pepcid on a regular basis for the next 5 days  At the time of discharge.  Mother stated that she is concerned that he may have diabetes as both she and his father are diabetic.  She states she checked his blood sugar several days ago and  it was elevated. We checked his blood sugar in the department.  It was a little over 200, but this is after a meal and after steroids.  I discussed this with both Girolamo and his mother and they have agreed to make an appointment with Dr. Wylene Simmer  The which is his mother's physician, next week after he is finished.  History arrived for evaluation of potential diagnoses, diabetes    Arman Filter, NP 03/08/12 2229  Arman Filter, NP 03/08/12 [id number]

## 2012-03-08 NOTE — ED Notes (Signed)
Pt. Started on Sunday with left eyelid swelling, now it has spread to nose and neck area.  Pt. Was in the woods on Saturday playing paintball with some friends.  Pt. Is having some difficulty swallowing.  Pt. Attempted to take Benadryl today but could not get the pills down.

## 2012-03-08 NOTE — Discharge Instructions (Signed)
Allergic Reaction Allergic reactions can be caused by anything your body is sensitive to. Your body may be sensitive to food, medicines, molds, pollens, cockroaches, dust mites, pets, insect stings, and other things around you. An allergic reaction may cause puffiness (swelling), itching, sneezing, coughing, or problems breathing.  Allergies cannot be cured, but they can be controlled with medicine. Some allergies happen only at certain times of the year. Try to stay away from what causes your reaction if possible. Sometimes, it is hard to tell what causes your reaction. HOME CARE If you have a rash or red patches (hives) on your skin:  Take medicines as told by your doctor.   Do not drive or drink alcohol after taking medicines. They can make you sleepy.   Put cold cloths on your skin. Take baths in cool water. This will help your itching. Do not take hot baths or showers. Heat will make the itching worse.   If your allergies get worse, your doctor might give you other medicines. Talk to your doctor if problems continue.  GET HELP RIGHT AWAY IF:   You have trouble breathing.   You have a tight feeling in your chest or throat.   Your mouth gets puffy (swollen).   You have red, itchy patches on your skin (hives) that get worse.   You have itching all over your body.  MAKE SURE YOU:   Understand these instructions.   Will watch your condition.   Will get help right away if you are not doing well or get worse.  Document Released: 11/18/2009 Document Revised: 11/19/2011 Document Reviewed: 11/18/2009 [facility] Patient Information 2012 [place], [place]. Please take the medication, as prescribed until completed, take the Pepcid twice a day for the first 3-5 days and then as needed

## 2012-03-09 NOTE — ED Provider Notes (Signed)
Medical screening examination/treatment/procedure(s) were performed by non-physician practitioner and as supervising physician I was immediately available for consultation/collaboration.  Raeford Razor, MD 03/09/12 0002

## 2012-04-12 IMAGING — CR DG ABDOMEN 1V
1 series · 1 of 1 positions shown · non-contrast
Comparison: Radiographs dated 08/29/2006

CLINICAL DATA: Abdominal pain and diarrhea for 3 weeks.

ABDOMEN - 1 VIEW

[view not recorded]
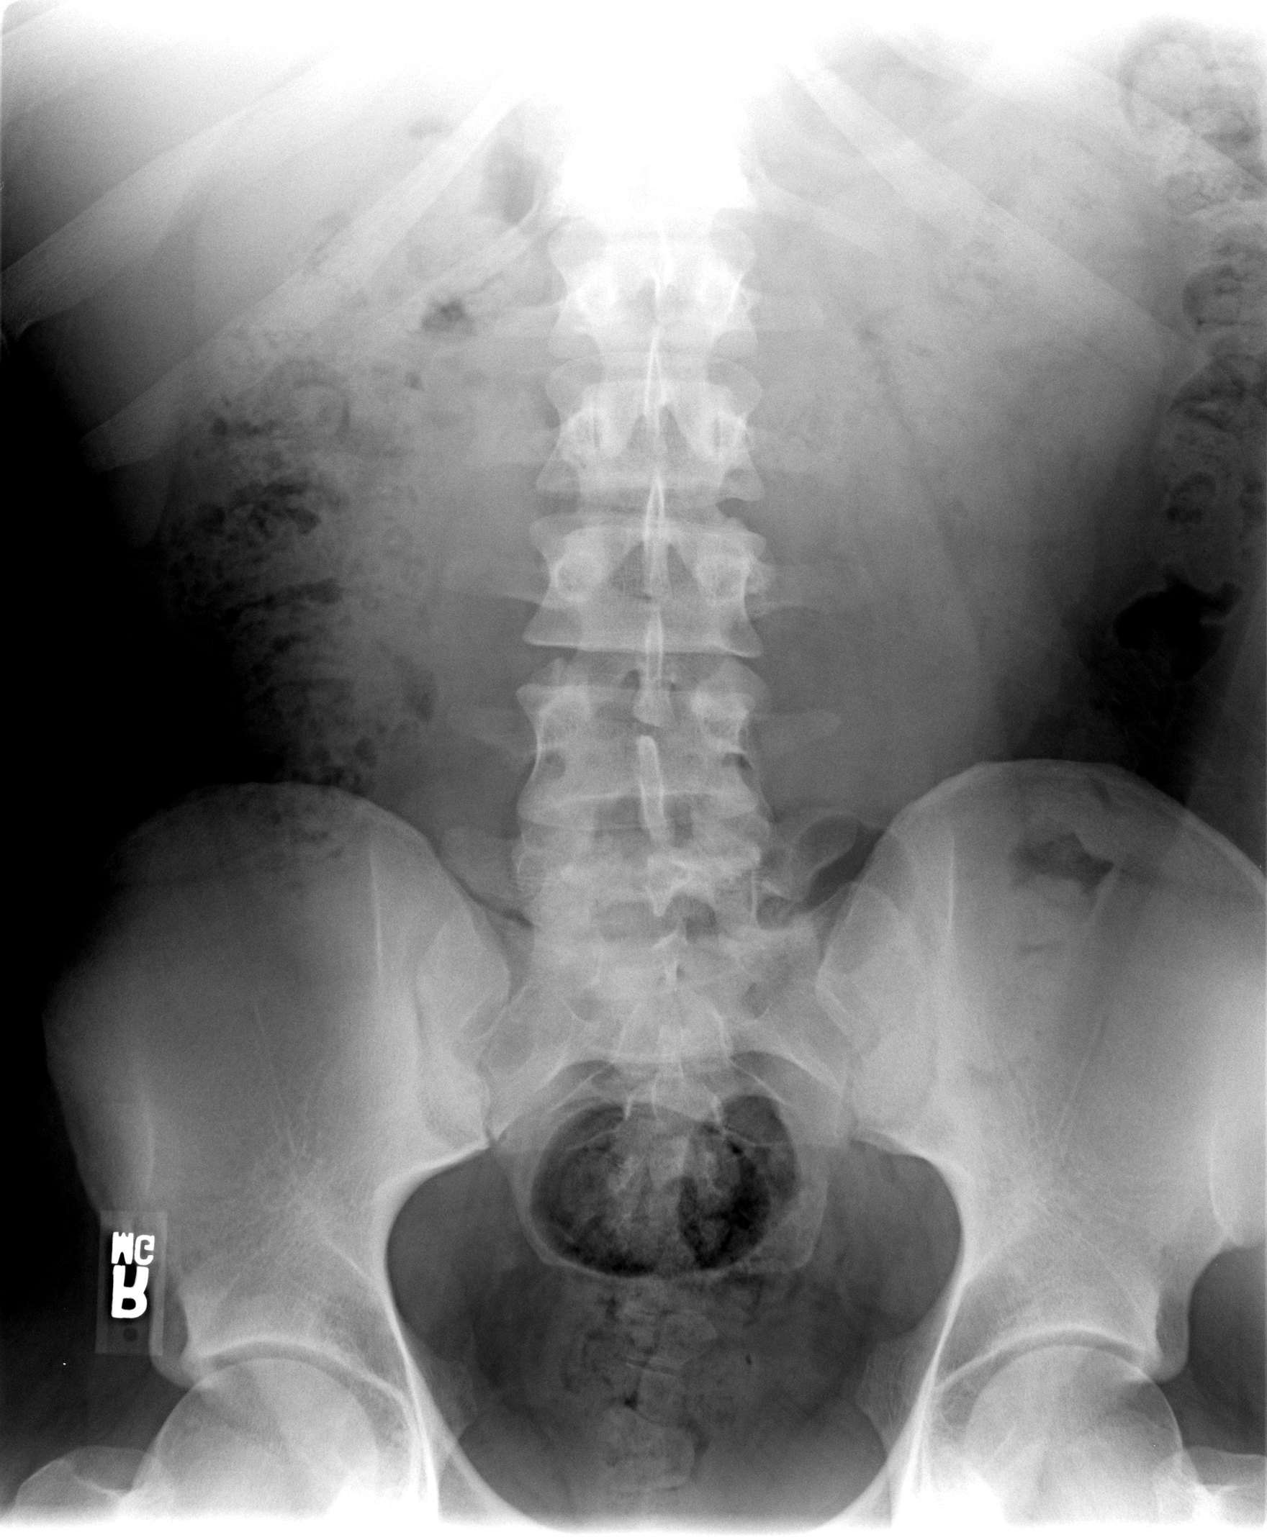

[1 of 1 positions shown; findings below may reference images not displayed]

FINDINGS: There are no dilated loops of large or small bowel.
There is stool scattered throughout the nondistended colon.  Bony
structures are normal.  No worrisome abdominal calcifications.
IMPRESSION: Benign-appearing abdomen.

## 2013-06-12 ENCOUNTER — Ambulatory Visit: Payer: Self-pay | Attending: Family Medicine | Admitting: Internal Medicine

## 2013-06-12 ENCOUNTER — Encounter: Payer: Self-pay | Admitting: Internal Medicine

## 2013-06-12 VITALS — BP 135/83 | HR 73 | Temp 98.5°F | Resp 16 | Ht 72.0 in | Wt 230.0 lb

## 2013-06-12 DIAGNOSIS — Z0000 Encounter for general adult medical examination without abnormal findings: Secondary | ICD-10-CM | POA: Insufficient documentation

## 2013-06-12 NOTE — Progress Notes (Signed)
Performed eye exam per Doctors orders.  OD:  20/20 OS:  20/20 OU:  20/20

## 2013-06-12 NOTE — Progress Notes (Signed)
Patient ID: Ernest Mccann, male   DOB: [birth year], 22 y.o.   MRN: [id number]  CC:  HPI:    This is a 22 year old male is here for a history and physical and completion of paperwork to apply for a job. He has no clinical complaints.    No Known Allergies History reviewed. No pertinent past medical history. No current outpatient prescriptions on file prior to visit.   No current facility-administered medications on file prior to visit.   History reviewed. No pertinent family history. History   Social History  . Marital Status: Single    Spouse Name: N/A    Number of Children: N/A  . Years of Education: N/A   Occupational History  . Not on file.   Social History Main Topics  . Smoking status: Never Smoker   . Smokeless tobacco: Not on file  . Alcohol Use: No  . Drug Use:   . Sexually Active:    Other Topics Concern  . Not on file   Social History Narrative  . No narrative on file    Review of Systems ______ Constitutional: Negative for fever, chills, diaphoresis, activity change, appetite change and fatigue. ____ HENT: Negative for ear pain, nosebleeds, congestion, facial swelling, rhinorrhea, neck pain, neck stiffness and ear discharge.  ____ Eyes: Negative for pain, discharge, redness, itching and visual disturbance. ____ Respiratory: Negative for cough, choking, chest tightness, shortness of breath, wheezing and stridor.  ____ Cardiovascular: Negative for chest pain, palpitations and leg swelling. ____ Gastrointestinal: Negative for abdominal distention. ____ Genitourinary: Negative for dysuria, urgency, frequency, hematuria, flank pain, decreased urine volume, difficulty urinating and dyspareunia. ____ Musculoskeletal: Negative for back pain, joint swelling, arthralgias and gait problem. ________ Neurological: Negative for dizziness, tremors, seizures, syncope, facial asymmetry, speech difficulty, weakness, light-headedness, numbness and headaches.  ____ Hematological: Negative for adenopathy. Does not bruise/bleed easily. ____ Psychiatric/Behavioral: Negative for hallucinations, behavioral problems, confusion, dysphoric mood, decreased concentration and agitation. ______   Objective:   Filed Vitals:   06/12/13 1719  BP: 135/83  Pulse: 73  Temp: 98.5 F (36.9 C)  Resp: 16    Physical Exam ______ Constitutional: Appears well-developed and well-nourished. No distress. ____ HENT: Normocephalic. External right and left ear normal. Oropharynx is clear and moist. ____ Eyes: Conjunctivae and EOM are normal. PERRLA, no scleral icterus. ____ Neck: Normal ROM. Neck supple. No JVD. No tracheal deviation. No thyromegaly. ____ CVS: RRR, S1/S2 +, no murmurs, no gallops, no carotid bruit.  Pulmonary: Effort and breath sounds normal, no stridor, rhonchi, wheezes, rales.  Abdominal: Soft. BS +,  no distension, tenderness, rebound or guarding. ________ Musculoskeletal: Normal range of motion. No edema and no tenderness. ____ Lymphadenopathy: No lymphadenopathy noted, cervical, inguinal. Neuro: Alert. Normal reflexes, muscle tone coordination. No cranial nerve deficit. Skin: Skin is warm and dry. No rash noted. Not diaphoretic. No erythema. No pallor. ____ Psychiatric: Normal mood and affect. Behavior, judgment, thought content normal. __  Lab Results  Component Value Date   WBC 6.8 04/18/2011   HGB 12.6* 04/18/2011   HCT 38.4* 04/18/2011   MCV 81.4 04/18/2011   PLT 435* 04/18/2011   Lab Results  Component Value Date   CREATININE 1.04 04/18/2011   BUN 16 04/18/2011   NA 139 04/18/2011   K 4.0 04/18/2011   CL 102 04/18/2011   CO2 28 04/18/2011    No results found for this basename: HGBA1C   Lipid Panel     Component Value Date/Time   CHOL  Value:  164        ATP III CLASSIFICATION:  <200     mg/dL   Desirable  454-098  mg/dL   Borderline High  >=119    mg/dL   High        SLIGHT HEMOLYSIS 12/20/2009 1550   TRIG 145 SLIGHT HEMOLYSIS 12/20/2009 1550   HDL  42 SLIGHT HEMOLYSIS 12/20/2009 1550   CHOLHDL 3.9 12/20/2009 1550   VLDL 29 12/20/2009 1550   LDLCALC  Value: 93        Total Cholesterol/HDL:CHD Risk Coronary Heart Disease Risk Table                     Men   Women  1/2 Average Risk   3.4   3.3  Average Risk       5.0   4.4  2 X Average Risk   9.6   7.1  3 X Average Risk  23.4   11.0        Use the calculated Patient Ratio above and the CHD Risk Table to determine the patient's CHD Risk.        ATP III CLASSIFICATION (LDL):  <100     mg/dL   Optimal  147-829  mg/dL   Near or Above                    Optimal  130-159  mg/dL   Borderline  562-130  mg/dL   High  >865     mg/dL   Very High 06/21/4695 2952       Assessment and plan:   There are no active problems to display for this patient.  Healthy 22 year old male. Vision and hearing exam is normal. Entire physical is essentially within normal limits. A urinalysis has been requested along with a tuberculin skin test. Patient is advised to return on Wednesday for the results of these. This should complete his paperwork.

## 2013-06-12 NOTE — Patient Instructions (Signed)
Return on Wednesday to have a urinalysis done and to have your tuberculin skin test read. Please bring in her paperwork.

## 2013-06-12 NOTE — Progress Notes (Signed)
Pt here for physical exam for [employer] training. Papers need filled out. Denies pain.vss. Pt also need to sign up for orange card

## 2013-06-14 ENCOUNTER — Other Ambulatory Visit: Payer: Self-pay

## 2013-06-14 ENCOUNTER — Ambulatory Visit: Payer: Self-pay | Attending: Family Medicine | Admitting: Internal Medicine

## 2013-06-14 ENCOUNTER — Encounter: Payer: Self-pay | Admitting: Internal Medicine

## 2013-06-14 LAB — POCT URINALYSIS DIPSTICK
Bilirubin, UA: NEGATIVE
Glucose, UA: 500
Leukocytes, UA: NEGATIVE
Nitrite, UA: NEGATIVE
Urobilinogen, UA: 0.2

## 2013-06-14 LAB — TB SKIN TEST
Induration: 0 mm
TB Skin Test: NEGATIVE

## 2013-06-14 NOTE — Progress Notes (Signed)
PPD TO RIGHT FOREARM NEGATIVE. NO INDURATION NOTED, OR SWELLING,REDNESS.

## 2013-06-14 NOTE — Progress Notes (Signed)
PT HERE FOR PPD READ/UA. PT NEED PAPER WORK FILLED OUT FOR [employer] .VSS

## 2013-06-14 NOTE — Patient Instructions (Addendum)
Health Maintenance, 18- to 21-Year-Old SCHOOL PERFORMANCE After high school completion, the young adult may be attending college, technical or vocational school, or entering the military or the work force. SOCIAL AND EMOTIONAL DEVELOPMENT The young adult establishes adult relationships and explores sexual identity. Young adults may be living at home or in a college dorm or apartment. Increasing independence is important with young adults. Throughout adolescence, teens should assume responsibility of their own health care. IMMUNIZATIONS Most young adults should be fully vaccinated. A booster dose of Tdap (tetanus, diphtheria, and pertussis, or "whooping cough"), a dose of meningococcal vaccine to protect against a certain type of bacterial meningitis, hepatitis A, human papillomarvirus (HPV), chickenpox, or measles vaccines may be indicated, if not given at an earlier age. Annual influenza or "flu" vaccination should be considered during flu season.  TESTING Annual screening for vision and hearing problems is recommended. Vision should be screened objectively at least once between 18 and 21 years of age. The young adult may be screened for anemia or tuberculosis. Young adults should have a blood test to check for high cholesterol during this time period. Young adults should be screened for use of alcohol and drugs. If the young adult is sexually active, screening for sexually transmitted infections, pregnancy, or HIV may be performed. Screening for cervical cancer should be performed within 3 years of beginning sexual activity. NUTRITION AND ORAL HEALTH  Adequate calcium intake is important. Consume 3 servings of low-fat milk and dairy products daily. For those who do not drink milk or consume dairy products, calcium enriched foods, such as juice, bread, or cereal, dark, leafy greens, or canned fish are alternate sources of calcium.  Drink plenty of water. Limit fruit juice to 8 to 12 ounces per day.  Avoid sugary beverages or sodas.  Discourage skipping meals, especially breakfast. Teens should eat a good variety of vegetables and fruits, as well as lean meats.  Avoid high fat, high salt, and high sugar foods, such as candy, chips, and cookies.  Encourage young adults to participate in meal planning and preparation.  Eat meals together as a family whenever possible. Encourage conversation at mealtime.  Limit fast food choices and eating out at restaurants.  Brush teeth twice a day and floss.  Schedule dental exams twice a year. SLEEP Regular sleep habits are important. PHYSICAL, SOCIAL, AND EMOTIONAL DEVELOPMENT  One hour of regular physical activity daily is recommended. Continue to participate in sports.  Encourage young adults to develop their own interests and consider community service or volunteerism.  Provide guidance to the young adult in making decisions about college and work plans.  Make sure that young adults know that they should never be in a situation that makes them uncomfortable, and they should tell partners if they do not want to engage in sexual activity.  Talk to the young adult about body image. Eating disorders may be noted at this time. Young adults may also be concerned about being overweight. Monitor the young adult for weight gain or loss.  Mood disturbances, depression, anxiety, alcoholism, or attention problems may be noted in young adults. Talk to the caregiver if there are concerns about mental illness.  Negotiate limit setting and independent decision making.  Encourage the young adult to handle conflict without physical violence.  Avoid loud noises which may impair hearing.  Limit television and computer time to 2 hours per day. Individuals who engage in excessive sedentary activity are more likely to become overweight. RISK BEHAVIORS  Sexually active   young adults need to take precautions against pregnancy and sexually transmitted  infections. Talk to young adults about contraception.  Provide a tobacco-free and drug-free environment for the young adult. Talk to the young adult about drug, tobacco, and alcohol use among friends or at friends' homes. Make sure the young adult knows that smoking tobacco or marijuana and taking drugs have health consequences and may impact brain development.  Teach the young adult about appropriate use of over-the-counter or prescription medicines.  Establish guidelines for driving and for riding with friends.  Talk to young adults about the risks of drinking and driving or boating. Encourage the young adult to call you if he or she or friends have been drinking or using drugs.  Remind young adults to wear seat belts at all times in cars and life vests in boats.  Young adults should always wear a properly fitted helmet when they are riding a bicycle.  Use caution with all-terrain vehicles (ATVs) or other motorized vehicles.  Do not keep handguns in the home. (If you do, the gun and ammunition should be locked separately and out of the young adult's access.)  Equip your home with smoke detectors and change the batteries regularly. Make sure all family members know the fire escape plans for your home.  Teach young adults not to swim alone and not to dive in shallow water.  All individuals should wear sunscreen that protects against UVA and UVB light with at least a sun protection factor (SPF) of 30 when out in the sun. This minimizes sun burning. WHAT'S NEXT? Young adults should visit their pediatrician or family physician yearly. By young adulthood, health care should be transitioned to a family physician or internal medicine specialist. Sexually active females may want to begin annual physical exams with a gynecologist. Document Released: 02/25/2007 Document Revised: 02/22/2012 Document Reviewed: 03/17/2007 [employer] Patient Information 2014 [employer].  

## 2013-06-14 NOTE — Progress Notes (Signed)
Patient ID: Ernest Mccann, male   DOB: 10/26/1991, 22 y.o.   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 Debbora Presto, MD  [facility] Pager [phone]  If 7PM-7AM, please contact night-coverage [email or web address] Password [email or web address]

## 2013-09-14 ENCOUNTER — Emergency Department (HOSPITAL_COMMUNITY)
Admission: EM | Admit: 2013-09-14 | Discharge: 2013-09-15 | Disposition: A | Discharge status: Home / Self Care | Payer: Self-pay | Attending: Emergency Medicine | Admitting: Emergency Medicine

## 2013-09-14 DIAGNOSIS — J069 Acute upper respiratory infection, unspecified: Secondary | ICD-10-CM | POA: Insufficient documentation

## 2013-09-14 NOTE — ED Provider Notes (Signed)
CSN: [id number]     Arrival date & time 09/14/13  2331 History   First MD Initiated Contact with Patient 09/14/13 2336     Chief Complaint  Patient presents with  . Sore Throat   (Consider location/radiation/quality/duration/timing/severity/associated sxs/prior Treatment) HPI Comments: Patient's 22 year old otherwise healthy male who presents for sore throat with onset 4 days ago. Patient states that symptoms have been since onset without any alleviating factors. Patient states that symptoms are mildly aggravated when eating solid foods. Patient endorses associated exudate on his uvula which he noticed today. He also endorses associated nasal congestion, rhinorrhea, and postnasal drip. Patient denies associated fevers, ear pain or discharge, neck pain or stiffness, inability to swallow, and drooling.  Patient is a 22 y.o. male presenting with pharyngitis. The history is provided by the patient. No language interpreter was used.  Sore Throat Associated symptoms include congestion and a sore throat. Pertinent negatives include no fever.    No past medical history on file. Past Surgical History  Procedure Laterality Date  . Tonsillectomy    . Knee arthroplasty     No family history on file. History  Substance Use Topics  . Smoking status: Never Smoker   . Smokeless tobacco: Not on file  . Alcohol Use: No    Review of Systems  Constitutional: Negative for fever.  HENT: Positive for congestion, sore throat and rhinorrhea. Negative for drooling and trouble swallowing.   Respiratory: Negative for shortness of breath.   All other systems reviewed and are negative.    Allergies  Review of patient's allergies indicates no known allergies.  Home Medications   Current Outpatient Rx  Name  Route  Sig  Dispense  Refill  . Dextromethorphan-Guaifenesin (MUCUS RELIEF DM COUGH PO)   Oral   Take 30 mLs by mouth daily as needed (for cough).         . DiphenhydrAMINE HCl (BENADRYL PO)  Oral   Take 2 tablets by mouth daily as needed (for allergies).          BP 171/99  Pulse 61  Temp(Src) 98.5 F (36.9 C) (Oral)  Resp 18  SpO2 98%  Physical Exam  Nursing note and vitals reviewed. Constitutional: He is oriented to person, place, and time. He appears well-developed and well-nourished. No distress.  HENT:  Head: Normocephalic and atraumatic.  Right Ear: Tympanic membrane, external ear and ear canal normal. No mastoid tenderness.  Left Ear: Tympanic membrane, external ear and ear canal normal. No mastoid tenderness.  Nose: Nose normal.  Mouth/Throat: Uvula is midline and mucous membranes are normal. Posterior oropharyngeal erythema (very mild) present. No oropharyngeal exudate.  Uvula midline and airway patent. Patient tolerating secretions without difficulty or drooling. No trismus or stridor.  Eyes: Conjunctivae and EOM are normal. Pupils are equal, round, and reactive to light. No scleral icterus.  Neck: Normal range of motion. Neck supple.  Cardiovascular: Normal rate, regular rhythm and normal heart sounds.   Pulmonary/Chest: Effort normal and breath sounds normal. No stridor. No respiratory distress. He has no wheezes. He has no rales.  Musculoskeletal: Normal range of motion.  Lymphadenopathy:    He has no cervical adenopathy.  Neurological: He is alert and oriented to person, place, and time.  Skin: Skin is warm and dry. No rash noted. He is not diaphoretic. No erythema. No pallor.  Psychiatric: He has a normal mood and affect. His behavior is normal.    ED Course  Procedures (including critical care time) Labs Review Labs  Reviewed  RAPID STREP SCREEN   Imaging Review No results found.  MDM   1. Viral URI    Uncomplicated upper respiratory infection. Exudate on uvula likely secondary to postnasal drip. Patient well and nontoxic appearing, hemodynamically stable, and afebrile. There is no trismus or stridor. Patient tolerating secretions without  difficulty or drooling. No neck pain or stiffness or cervical lymphadenopathy. Rapid strep screen negative. Patient appropriate for discharge with instructions for supportive treatment for symptoms. Return precautions discussed and patient agreeable to plan with no unaddressed concerns.    Antony Madura, PA-C 09/15/13 0040

## 2013-09-14 NOTE — ED Notes (Signed)
Presents with sore throat and exudate. Airway intact, VSS

## 2013-09-15 LAB — RAPID STREP SCREEN (MED CTR MEBANE ONLY): Streptococcus, Group A Screen (Direct): NEGATIVE

## 2013-09-15 NOTE — ED Provider Notes (Signed)
Medical screening examination/treatment/procedure(s) were performed by non-physician practitioner and as supervising physician I was immediately available for consultation/collaboration.  Klaus Casteneda M Siah Steely, MD 09/15/13 0527 

## 2013-09-17 LAB — CULTURE, GROUP A STREP

## 2014-07-27 ENCOUNTER — Encounter (HOSPITAL_COMMUNITY): Payer: Self-pay | Admitting: Emergency Medicine

## 2014-07-27 ENCOUNTER — Emergency Department (HOSPITAL_COMMUNITY): Payer: Self-pay

## 2014-07-27 ENCOUNTER — Emergency Department (HOSPITAL_COMMUNITY)
Admission: EM | Admit: 2014-07-27 | Discharge: 2014-07-28 | Discharge status: Against Medical Advice | Payer: Self-pay | Attending: Emergency Medicine | Admitting: Emergency Medicine

## 2014-07-27 DIAGNOSIS — Y929 Unspecified place or not applicable: Secondary | ICD-10-CM | POA: Insufficient documentation

## 2014-07-27 DIAGNOSIS — T391X1A Poisoning by 4-Aminophenol derivatives, accidental (unintentional), initial encounter: Secondary | ICD-10-CM | POA: Insufficient documentation

## 2014-07-27 DIAGNOSIS — Y939 Activity, unspecified: Secondary | ICD-10-CM | POA: Insufficient documentation

## 2014-07-27 DIAGNOSIS — R51 Headache: Secondary | ICD-10-CM | POA: Insufficient documentation

## 2014-07-27 DIAGNOSIS — E1110 Type 2 diabetes mellitus with ketoacidosis without coma: Secondary | ICD-10-CM | POA: Insufficient documentation

## 2014-07-27 DIAGNOSIS — G44219 Episodic tension-type headache, not intractable: Secondary | ICD-10-CM | POA: Insufficient documentation

## 2014-07-27 HISTORY — DX: Type 2 diabetes mellitus without complications: E11.9

## 2014-07-27 LAB — CBC WITH DIFFERENTIAL/PLATELET
BASOS PCT: 0 % (ref 0–1)
Basophils Absolute: 0 10*3/uL (ref 0.0–0.1)
Eosinophils Absolute: 0.2 10*3/uL (ref 0.0–0.7)
Eosinophils Relative: 4 % (ref 0–5)
HCT: 43.9 % (ref 39.0–52.0)
HEMOGLOBIN: 15.5 g/dL (ref 13.0–17.0)
LYMPHS ABS: 2.6 10*3/uL (ref 0.7–4.0)
Lymphocytes Relative: 44 % (ref 12–46)
MCH: 29.2 pg (ref 26.0–34.0)
MCHC: 35.3 g/dL (ref 30.0–36.0)
MCV: 82.7 fL (ref 78.0–100.0)
MONOS PCT: 6 % (ref 3–12)
Monocytes Absolute: 0.4 10*3/uL (ref 0.1–1.0)
NEUTROS ABS: 2.7 10*3/uL (ref 1.7–7.7)
NEUTROS PCT: 46 % (ref 43–77)
PLATELETS: 290 10*3/uL (ref 150–400)
RBC: 5.31 MIL/uL (ref 4.22–5.81)
RDW: 12.4 % (ref 11.5–15.5)
WBC: 5.9 10*3/uL (ref 4.0–10.5)

## 2014-07-27 LAB — COMPREHENSIVE METABOLIC PANEL
ALBUMIN: 4.2 g/dL (ref 3.5–5.2)
ALK PHOS: 103 U/L (ref 39–117)
ALT: 63 U/L — AB (ref 0–53)
ANION GAP: 17 — AB (ref 5–15)
AST: 31 U/L (ref 0–37)
BILIRUBIN TOTAL: 0.2 mg/dL — AB (ref 0.3–1.2)
BUN: 15 mg/dL (ref 6–23)
CHLORIDE: 97 meq/L (ref 96–112)
CO2: 21 mEq/L (ref 19–32)
Calcium: 9.3 mg/dL (ref 8.4–10.5)
Creatinine, Ser: 0.8 mg/dL (ref 0.50–1.35)
GFR calc Af Amer: >90 mL/min (ref >90)
GFR calc non Af Amer: >90 mL/min (ref >90)
GLUCOSE: 521 mg/dL — AB (ref 70–99)
POTASSIUM: 4.3 meq/L (ref 3.7–5.3)
SODIUM: 135 meq/L — AB (ref 137–147)
Total Protein: 7 g/dL (ref 6.0–8.3)

## 2014-07-27 LAB — ACETAMINOPHEN LEVEL: Acetaminophen (Tylenol), Serum: 24.2 ug/mL (ref 10–30)

## 2014-07-27 MED ORDER — SODIUM CHLORIDE 0.9 % IV BOLUS (SEPSIS)
1000.0000 mL | Freq: Once | INTRAVENOUS | Status: AC
  Administered 2014-07-27: 1000 mL via INTRAVENOUS

## 2014-07-27 NOTE — ED Provider Notes (Signed)
Patient reports headache onset 2 weeks ago after having sexual intercourse. Headache was gradual in onset over 2 hour time period. He's been getting similar headaches each gradual onset approximately every other day accompanied by blurred vision for the past 2 weeks. He is asymptomatic presently and his eyesight is normal presently. He treated himself a handful of Tylenol tablets approximately 8 tablets he reports that 7 PM tonight. On exam no distress HEENT exam no facial asymmetry eyes pupils 3 mm equal reactive to light extraocular muscles intact fundi benign neurologic Glasgow Coma Score 15 cranial nerves II through XII grossly intact gait normal Romberg normal prior drift normal DTRs symmetric bilaterally at knee jerk and jerk and biceps toes to her going bilaterally finger to nose normal. Poison control notified and hospital pharmacist notified if four-hour Tylenol level greater than 150 he should be treated with Mucomyst. I strongly doubt subarachnoid hemorrhage based on history.  Doug SouSam Jamacia Jester, MD 07/27/14 [phone]

## 2014-07-27 NOTE — ED Notes (Addendum)
Spoke with Angelique Blonderenise from poison control about pts possible overdose. She recommended that we draw hepatic leves, Acetaminophen level and get an EKG. Poison control also recommended that we start a loading dose of Mucomyst at 140mg  per kg,  followed by 70mg  per kg for 5 doses every 4 hours after initial dose. PA Fayrene HelperBowie Tran notified and Dr. Felix PaciniJacobuwitz notified of recommendation.

## 2014-07-27 NOTE — ED Provider Notes (Signed)
CSN: [id number]     Arrival date & time 07/27/14  1957 History   First MD Initiated Contact with Patient 07/27/14 2200     Chief Complaint  Patient presents with  . Headache  . Eye Problem     (Consider location/radiation/quality/duration/timing/severity/associated sxs/prior Treatment) The history is provided by the patient. No language interpreter was used.    23 year old male presents for of unintentional overdose on Tylenol. Patient states for the past 2 weeks he has been having intermittent headache. Describe headaches as a sharp sensation to the back of his head radiates to his neck that is gradual on onset usually lasting for several hours (2-3 hrs) but lately has been increasing in frequency and duration. Also endorse occasional blurred vision to both eyes lasting for minutes but resolved. Headaches is improved with applying ice to his head and worsening with sexual activity. He admits that he plays a lot of video games and has been more stressed out than usual.  Yesterday he took 2 Tylenol which provide some relief. Today is his headache has been persistent throughout the day so he took a handful of Tylenol  at approximately 3 hours ago. Headache shortly resolved but he is concern of accidental overdose. Denies any aural prior to headache. No fever, chills, double vision, chest pain, shortness of breath, productive cough, back pain, abdominal pain, dysuria, or rash.    History reviewed. No pertinent past medical history. Past Surgical History  Procedure Laterality Date  . Tonsillectomy    . Knee arthroplasty     No family history on file. History  Substance Use Topics  . Smoking status: Never Smoker   . Smokeless tobacco: Not on file  . Alcohol Use: No    Review of Systems  All other systems reviewed and are negative.     Allergies  Review of patient's allergies indicates no known allergies.  Home Medications   Prior to Admission medications   Medication Sig Start  Date End Date Taking? Authorizing Provider  Dextromethorphan-Guaifenesin (MUCUS RELIEF DM COUGH PO) Take 30 mLs by mouth daily as needed (for cough).    Historical Provider, MD  DiphenhydrAMINE HCl (BENADRYL PO) Take 2 tablets by mouth daily as needed (for allergies).    Historical Provider, MD   BP 150/78  Pulse 92  Temp(Src) 98 F (36.7 C)  Resp 18  Wt 234 lb 3 oz (106.227 kg)  SpO2 98% Physical Exam  Constitutional: He is oriented to person, place, and time. He appears well-developed and well-nourished. No distress.  HENT:  Head: Atraumatic.  Mouth/Throat: Oropharynx is clear and moist.  Eyes: Conjunctivae and EOM are normal. Pupils are equal, round, and reactive to light.  Neck: Normal range of motion. Neck supple.  No nuchal rigidity  Cardiovascular: Normal rate and regular rhythm.   Pulmonary/Chest: Effort normal and breath sounds normal.  Abdominal: Soft. Bowel sounds are normal. He exhibits no distension. There is no tenderness.  Neurological: He is alert and oriented to person, place, and time. He has normal strength. No cranial nerve deficit or sensory deficit. GCS eye subscore is 4. GCS verbal subscore is 5. GCS motor subscore is 6.  Neurologic exam:  Speech clear, pupils equal round reactive to light, extraocular movements intact  Normal peripheral visual fields Cranial nerves III through XII normal including no facial droop Follows commands, moves all extremities x4, normal strength to bilateral upper and lower extremities at all major muscle groups including grip Sensation normal to light touch  Coordination  intact, no limb ataxia, finger-nose-finger normal Rapid alternating movements normal No pronator drift Gait normal   Skin: No rash noted.  Psychiatric: He has a normal mood and affect.    ED Course  Procedures (including critical care time)  10:13 PM Accidental overdose of tylenol.  Sts he took "a handful", unknown amount of tylenol. Work up [employer].  Pt  currently stable and in NAD.  Poison Control contacted.  IVF started.  No headache at this time.  No focal neuro deficits on exam.  No acute onset thunderclap headache suggestive of SAH, no fever, nuchal rigidity concerning for meningitis, no focal neuro deficit on exam to suggest stroke.    10:54 PM We have contact Poison Control Center.  They recommend checking CMP, tylenol level, EKG, initiate mucomyst 140mg /kg PO, and subsequently 70mg /kg every 4 hrs x 5 doses.  I discuss this with Dr. Ethelda Chick, who recommend to wait for tylenol level first.    12:20 AM Normal tylenol level. Will not initiate mucomyst.  However pt has CBG 521, anion gap is 17, with 15 ketone in UA .  No prior hx of diabetes.  Pt does admits to having polyuria and polydipsia.  Also notice foamy urine.  Both mom and dad has diabetes.  Plan to initiate glucostabilizer.  Pt likely discharge with Metformin and close f/u with PCP for further care.    Care discussed with Dr. Lavella Lemons who will d/c pt once CBG approach 250s.   1:12 AM Pt at this time request to leave AMA.  He does not want to stay or be admitted for further work up of new onset diabetes.  He understand the risk of worsening sxs, comatose and possibly death.  He is aware to return.  He is encourage to f/u with PCP for further care.  Case discussed with Dr. Lavella Lemons who also evaluate pt.  Pt will be dc with metformin 1,000mg  PO BID.     CRITICAL CARE Performed by: Fayrene Helper Total critical care time: 30 min Critical care time was exclusive of separately billable procedures and treating other patients. Critical care was necessary to treat or prevent imminent or life-threatening deterioration. Critical care was time spent personally by me on the following activities: development of treatment plan with patient and/or surrogate as well as nursing, discussions with consultants, evaluation of patient's response to treatment, examination of patient, obtaining history from patient  or surrogate, ordering and performing treatments and interventions, ordering and review of laboratory studies, ordering and review of radiographic studies, pulse oximetry and re-evaluation of patient's condition.   Labs Review Labs Reviewed  COMPREHENSIVE METABOLIC PANEL - Abnormal; Notable for the following:    Sodium 135 (*)    Glucose, Bld 521 (*)    ALT 63 (*)    Total Bilirubin 0.2 (*)    Anion gap 17 (*)    All other components within normal limits  URINALYSIS, ROUTINE W REFLEX MICROSCOPIC - Abnormal; Notable for the following:    Specific Gravity, Urine 1.042 (*)    Glucose, UA >1000 (*)    Ketones, ur 15 (*)    All other components within normal limits  CBC WITH DIFFERENTIAL  ACETAMINOPHEN LEVEL  URINE RAPID DRUG SCREEN (HOSP PERFORMED)  URINE MICROSCOPIC-ADD ON  BLOOD GAS, VENOUS    Imaging Review Ct Head Wo Contrast  07/27/2014   CLINICAL DATA:  Headache, left-sided pain  EXAM: CT HEAD WITHOUT CONTRAST  TECHNIQUE: Contiguous axial images were obtained from the base of the skull through  the vertex without intravenous contrast.  COMPARISON:  12/20/2009  FINDINGS: No acute intracranial hemorrhage. No focal mass lesion. No CT evidence of acute infarction. No midline shift or mass effect. No hydrocephalus. Basilar cisterns are patent. Paranasal sinuses and mastoid air cells are clear.  IMPRESSION: Normal head CT   Electronically Signed   By: Genevive BiStewart  Edmunds M.D.   On: 07/27/2014 20:44     EKG Interpretation None      Date: 07/27/2014  Rate: 70  Rhythm: normal sinus rhythm  QRS Axis: normal  Intervals: normal  ST/T Wave abnormalities: normal  Conduction Disutrbances: none  Narrative Interpretation:   Old EKG Reviewed: none for comparison     MDM   Final diagnoses:  Tylenol overdose, accidental or unintentional, initial encounter  Episodic tension-type headache, not intractable  Type 2 diabetes mellitus with ketoacidosis without coma    BP 126/72  Pulse 69   Temp(Src) 98 F (36.7 C)  Resp 21  Wt 234 lb 3 oz (106.227 kg)  SpO2 99%  I have reviewed nursing notes and vital signs. I personally reviewed the imaging tests through PACS system  I reviewed available ER/hospitalization records thought the EMR     Fayrene HelperBowie Sheretha Shadd, [place]PA-C 07/28/14 0113

## 2014-07-27 NOTE — ED Notes (Addendum)
Headaches: starts in back of head - left side, and radiating down neck. When having these h/as. Mild blurred vision. Family hx. Of migraines and strokes. Neg. For stroke in triage. Took much tylenol to relief pain to a dull pain. H/a worsening over x 2 weeks.

## 2014-07-27 NOTE — ED Notes (Signed)
PA at bedside.

## 2014-07-27 NOTE — ED Notes (Addendum)
Pt states he took 10+ 500mg  tylenol to help relieve HA; pt states he took about 6pm' Denies SI

## 2014-07-28 ENCOUNTER — Encounter (HOSPITAL_COMMUNITY): Payer: Self-pay | Admitting: Emergency Medicine

## 2014-07-28 LAB — BLOOD GAS, VENOUS
Acid-base deficit: 6.3 mmol/L — ABNORMAL HIGH (ref 0.0–2.0)
BICARBONATE: 19.3 meq/L — AB (ref 20.0–24.0)
FIO2: 0.21 %
O2 Saturation: 88.6 %
PH VEN: 7.274 (ref 7.250–7.300)
PO2 VEN: 64.5 mmHg — AB (ref 30.0–45.0)
Patient temperature: 98.6
TCO2: 20.6 mmol/L (ref 0–100)
pCO2, Ven: 43.1 mmHg — ABNORMAL LOW (ref 45.0–50.0)

## 2014-07-28 LAB — URINALYSIS, ROUTINE W REFLEX MICROSCOPIC
Bilirubin Urine: NEGATIVE
HGB URINE DIPSTICK: NEGATIVE
Ketones, ur: 15 mg/dL — AB
Leukocytes, UA: NEGATIVE
Nitrite: NEGATIVE
Protein, ur: NEGATIVE mg/dL
SPECIFIC GRAVITY, URINE: 1.042 — AB (ref 1.005–1.030)
Urobilinogen, UA: 0.2 mg/dL (ref 0.0–1.0)
pH: 5 (ref 5.0–8.0)

## 2014-07-28 LAB — RAPID URINE DRUG SCREEN, HOSP PERFORMED
Amphetamines: NOT DETECTED
BENZODIAZEPINES: NOT DETECTED
Barbiturates: NOT DETECTED
COCAINE: NOT DETECTED
Opiates: NOT DETECTED
Tetrahydrocannabinol: NOT DETECTED

## 2014-07-28 LAB — URINE MICROSCOPIC-ADD ON

## 2014-07-28 MED ORDER — METFORMIN HCL 500 MG PO TABS
1000.0000 mg | ORAL_TABLET | Freq: Two times a day (BID) | ORAL | Status: DC
Start: 2014-07-28 — End: 2014-08-15

## 2014-07-28 MED ORDER — DEXTROSE-NACL 5-0.45 % IV SOLN: INTRAVENOUS | Status: DC

## 2014-07-28 MED ORDER — SODIUM CHLORIDE 0.9 % IV SOLN
INTRAVENOUS | Status: DC
  Filled 2014-07-28: qty 1

## 2014-07-28 MED ORDER — METFORMIN HCL 500 MG PO TABS: 500.0000 mg | ORAL_TABLET | Freq: Two times a day (BID) | ORAL | Status: DC

## 2014-07-28 MED ORDER — SODIUM CHLORIDE 0.9 % IV BOLUS (SEPSIS)
1000.0000 mL | Freq: Once | INTRAVENOUS | Status: AC
  Administered 2014-07-28: 1000 mL via INTRAVENOUS

## 2014-07-28 NOTE — ED Notes (Signed)
Mother states pt upset about recent dx of DM; pt says he wants to leave AMA; Md notified; Lavella LemonsManly at bedside with mother

## 2014-07-28 NOTE — Discharge Instructions (Signed)
You have been evaluated for your headache and vision changes.  This is likely related to diabetes as your blood sugar is very high today.  Please follow up with your doctor for further evaluation of this new diagnosis.  Take metformin as prescribed.  You also have been evaluated for accidental overdose of tylenol.  Please take medication only as recommended and never to exceed recommended dose. Avoid tylenol containing product for the next week.  Return to ER if you have any concerns   Accidental Overdose A drug overdose occurs when a chemical substance (drug or medication) is used in amounts large enough to overcome a person. This may result in severe illness or death. This is a type of poisoning. Accidental overdoses of medications or other substances come from a variety of reasons. When this happens accidentally, it is often because the person taking the substance does not know enough about what they have taken. Drugs which commonly cause overdose deaths are alcohol, psychotropic medications (medications which affect the mind), pain medications, illegal drugs (street drugs) such as cocaine and heroin, and multiple drugs taken at the same time. It may result from careless behavior (such as over-indulging at a party). Other causes of overdose may include multiple drug use, a lapse in memory, or drug use after a period of no drug use.  Sometimes overdosing occurs because a person cannot remember if they have taken their medication.  A common unintentional overdose in young children involves multi-vitamins containing iron. Iron is a part of the hemoglobin molecule in blood. It is used to transport oxygen to living cells. When taken in small amounts, iron allows the body to restock hemoglobin. In large amounts, it causes problems in the body. If this overdose is not treated, it can lead to death. Never take medicines that show signs of tampering or do not seem quite right. Never take medicines in the dark or in  poor lighting. Read the label and check each dose of medicine before you take it. When adults are poisoned, it happens most often through carelessness or lack of information. Taking medicines in the dark or taking medicine prescribed for someone else to treat the same type of problem is a dangerous practice. SYMPTOMS  Symptoms of overdose depend on the medication and amount taken. They can vary from over-activity with stimulant over-dosage, to sleepiness from depressants such as alcohol, narcotics and tranquilizers. Confusion, dizziness, nausea and vomiting may be present. If problems are severe enough coma and death may result. DIAGNOSIS  Diagnosis and management are generally straightforward if the drug is known. Otherwise it is more difficult. At times, certain symptoms and signs exhibited by the patient, or blood tests, can reveal the drug in question.  TREATMENT  In an emergency department, most patients can be treated with supportive measures. Antidotes may be available if there has been an overdose of opioids or benzodiazepines. A rapid improvement will often occur if this is the cause of overdose. At home or away from medical care:  There may be no immediate problems or warning signs in children.  Not everything works well in all cases of poisoning.  Take immediate action. Poisons may act quickly.  If you think someone has swallowed medicine or a household product, and the person is unconscious, having seizures (convulsions), or is not breathing, immediately call for an ambulance. IF a person is conscious and appears to be doing OK but has swallowed a poison:  Do not wait to see what effect the poison  will have. Immediately call a poison control center (listed in the white pages of your telephone book under "Poison Control" or inside the front cover with other emergency numbers). Some poison control centers have TTY capability for the deaf. Check with your local center if you or someone in  your family requires this service.  Keep the container so you can read the label on the product for ingredients.  Describe what, when, and how much was taken and the age and condition of the person poisoned. Inform them if the person is vomiting, choking, drowsy, shows a change in color or temperature of skin, is conscious or unconscious, or is convulsing.  Do not cause vomiting unless instructed by medical personnel. Do not induce vomiting or force liquids into a person who is convulsing, unconscious, or very drowsy. Stay calm and in control.   Activated charcoal also is sometimes used in certain types of poisoning and you may wish to add a supply to your emergency medicines. It is available without a prescription. Call a poison control center before using this medication. PREVENTION  Thousands of children die every year from unintentional poisoning. This may be from household chemicals, poisoning from carbon monoxide in a car, taking their parent's medications, or simply taking a few iron pills or vitamins with iron. Poisoning comes from unexpected sources.  Store medicines out of the sight and reach of children, preferably in a locked cabinet. Do not keep medications in a food cabinet. Always store your medicines in a secure place. Get rid of expired medications.  If you have children living with you or have them as occasional guests, you should have child-resistant caps on your medicine containers. Keep everything out of reach. Child proof your home.  If you are called to the telephone or to answer the door while you are taking a medicine, take the container with you or put the medicine out of the reach of small children.  Do not take your medication in front of children. Do not tell your child how good a medication is and how good it is for them. They may get the idea it is more of a treat.  If you are an adult and have accidentally taken an overdose, you need to consider how this happened  and what can be done to prevent it from happening again. If this was from a street drug or alcohol, determine if there is a problem that needs addressing. If you are not sure a problems exists, it is easy to talk to a professional and ask them if they think you have a problem. It is better to handle this problem in this way before it happens again and has a much worse consequence. Document Released: 02/13/2005 Document Revised: 02/22/2012 Document Reviewed: 07/22/2009 [facility] Patient Information 2015 [place], [place]. This information is not intended to replace advice given to you by your health care provider. Make sure you discuss any questions you have with your health care provider.

## 2014-07-28 NOTE — ED Provider Notes (Signed)
Medical screening examination/treatment/procedure(s) were conducted as a shared visit with non-physician practitioner(s) and myself.  I personally evaluated the patient during the encounter.   EKG Interpretation None       Doug SouSam Josephine Rudnick, MD 07/28/14 [phone]

## 2014-07-28 NOTE — ED Notes (Signed)
Pt appear very upset about DM dx. Did not want to be further treated; RN explained risk of AMA; RN went over simple DM maintaining info.; Pt a&o at discharge

## 2014-07-28 NOTE — ED Provider Notes (Signed)
Patient evaluated by me. Came in with headache and days to weeks of polyuria and polydypsia. Found to have new onset diabetes with glucose of 520 mg/dL and trace ketones and gap of 17. I have recommended to the patent that he be admitted. He is uninsured and has no PCP. We need to get his BG under control, do some basic teaching and set him up with tools and medication to safely manage his DM at home. He refuses admission stating "I just got out of jail for 4 months and I won't be staying anywhere but my house". Risks of complications - including DKA and death. Voices understanding and says he does not care. Conversation witnessed by the patient's mom. We will tx with metformin and [facility]. REturn indications discussed.   Brandt LoosenJulie Manly, MD 07/28/14 [phone]

## 2014-07-28 NOTE — ED Provider Notes (Signed)
Medical screening examination/treatment/procedure(s) were conducted as a shared visit with non-physician practitioner(s) and myself.  I personally evaluated the patient during the encounter.   EKG Interpretation None        Brandt LoosenJulie Manly, MD 07/28/14 [phone]

## 2014-08-15 ENCOUNTER — Ambulatory Visit: Payer: Self-pay | Attending: Internal Medicine | Admitting: Internal Medicine

## 2014-08-15 ENCOUNTER — Encounter: Payer: Self-pay | Admitting: Internal Medicine

## 2014-08-15 VITALS — BP 143/88 | HR 69 | Temp 98.0°F | Resp 16 | Ht 72.0 in | Wt 235.0 lb

## 2014-08-15 DIAGNOSIS — E1310 Other specified diabetes mellitus with ketoacidosis without coma: Secondary | ICD-10-CM | POA: Insufficient documentation

## 2014-08-15 DIAGNOSIS — E1110 Type 2 diabetes mellitus with ketoacidosis without coma: Secondary | ICD-10-CM

## 2014-08-15 DIAGNOSIS — Z833 Family history of diabetes mellitus: Secondary | ICD-10-CM | POA: Insufficient documentation

## 2014-08-15 DIAGNOSIS — E1165 Type 2 diabetes mellitus with hyperglycemia: Secondary | ICD-10-CM | POA: Insufficient documentation

## 2014-08-15 LAB — LIPID PANEL
CHOL/HDL RATIO: 4.2 ratio
Cholesterol: 151 mg/dL (ref 0–200)
HDL: 36 mg/dL — AB (ref >39)
LDL Cholesterol: 74 mg/dL (ref 0–99)
Triglycerides: 207 mg/dL — ABNORMAL HIGH (ref <150)
VLDL: 41 mg/dL — AB (ref 0–40)

## 2014-08-15 LAB — COMPLETE METABOLIC PANEL WITH GFR
ALK PHOS: 64 U/L (ref 39–117)
ALT: 45 U/L (ref 0–53)
AST: 24 U/L (ref 0–37)
Albumin: 4.4 g/dL (ref 3.5–5.2)
BUN: 10 mg/dL (ref 6–23)
CO2: 25 mEq/L (ref 19–32)
Calcium: 8.6 mg/dL (ref 8.4–10.5)
Chloride: 103 mEq/L (ref 96–112)
Creat: 0.83 mg/dL (ref 0.50–1.35)
GFR, Est African American: >89 mL/min
GFR, Est Non African American: >89 mL/min
Glucose, Bld: 179 mg/dL — ABNORMAL HIGH (ref 70–99)
Potassium: 4.3 mEq/L (ref 3.5–5.3)
Sodium: 138 mEq/L (ref 135–145)
Total Bilirubin: 0.6 mg/dL (ref 0.2–1.2)
Total Protein: 6.3 g/dL (ref 6.0–8.3)

## 2014-08-15 LAB — POCT URINALYSIS DIPSTICK
Bilirubin, UA: NEGATIVE
Bilirubin, UA: NEGATIVE
Blood, UA: NEGATIVE
Glucose, UA: 500
Glucose, UA: 500
KETONES UA: NEGATIVE
Leukocytes, UA: NEGATIVE
Leukocytes, UA: NEGATIVE
Nitrite, UA: NEGATIVE
Nitrite, UA: NEGATIVE
Protein, UA: NEGATIVE
RBC UA: NEGATIVE
SPEC GRAV UA: 1.02
Spec Grav, UA: 1.025
UROBILINOGEN UA: 0.2
Urobilinogen, UA: 0.2
pH, UA: 5
pH, UA: 5.5

## 2014-08-15 LAB — GLUCOSE, POCT (MANUAL RESULT ENTRY)
POC GLUCOSE: 228 mg/dL — AB (ref 70–99)
POC Glucose: 201 mg/dl — AB (ref 70–99)
POC Glucose: 172 mg/dl — AB (ref 70–99)

## 2014-08-15 LAB — TSH: TSH: 3.426 u[IU]/mL (ref 0.350–4.500)

## 2014-08-15 LAB — CBC
HEMATOCRIT: 43.2 % (ref 39.0–52.0)
Hemoglobin: 14.5 g/dL (ref 13.0–17.0)
MCH: 27.9 pg (ref 26.0–34.0)
MCHC: 33.6 g/dL (ref 30.0–36.0)
MCV: 83.2 fL (ref 78.0–100.0)
Platelets: 303 10*3/uL (ref 150–400)
RBC: 5.19 MIL/uL (ref 4.22–5.81)
RDW: 13.4 % (ref 11.5–15.5)
WBC: 5.4 10*3/uL (ref 4.0–10.5)

## 2014-08-15 LAB — POCT GLYCOSYLATED HEMOGLOBIN (HGB A1C): Hemoglobin A1C: 10.9

## 2014-08-15 MED ORDER — "INSULIN SYRINGE 28G X 1/2"" 0.5 ML MISC": Status: DC

## 2014-08-15 MED ORDER — INSULIN DETEMIR 100 UNIT/ML ~~LOC~~ SOLN: 10.0000 [IU] | Freq: Every day | SUBCUTANEOUS | Status: DC

## 2014-08-15 MED ORDER — GLUCOSE BLOOD VI STRP: ORAL_STRIP | Status: DC

## 2014-08-15 MED ORDER — FREESTYLE LANCETS MISC: Status: DC

## 2014-08-15 MED ORDER — LISINOPRIL 5 MG PO TABS: 5.0000 mg | ORAL_TABLET | Freq: Every day | ORAL | Status: DC

## 2014-08-15 MED ORDER — METFORMIN HCL 500 MG PO TABS: 1000.0000 mg | ORAL_TABLET | Freq: Two times a day (BID) | ORAL | Status: DC

## 2014-08-15 MED ORDER — FREESTYLE SYSTEM KIT: 1.0000 | PACK | Status: DC | PRN

## 2014-08-15 NOTE — Progress Notes (Signed)
Patient ID: Ernest Mccann, male   DOB: January 08, 1991, 23 y.o.   MRN: [id number]  [id number]:[id number]  [id number]  DOB - 10-Aug-1991  CC:  Chief Complaint  Patient presents with  . Establish Care       HPI: Ernest Mccann is a 23 y.o. male here today to establish medical care.  Patient was evaluated in the ER yesterday for headaches and accidental tylenol overdose.  Patient reports that he does not remember how many he took but he was attempting to get rid of the headache.  He was found to have Ketones in urine and was given fluid and insulin.  He left AMA after treatment.  He was discharges with Metformin 1,000 mg BID. Today he presents with a hemoglobin a1c of 10.9 and with trace ketones.  He endorses the 3 p's and states that his mom has been giving him a couple of units of his novolog to help lower his blood sugar.   Patient has No chest pain, No abdominal pain - No Nausea, No new weakness tingling or numbness, No Cough - SOB.  No Known Allergies Past Medical History  Diagnosis Date  . Diabetes mellitus without complication    Current Outpatient Prescriptions on File Prior to Visit  Medication Sig Dispense Refill  . metFORMIN (GLUCOPHAGE) 500 MG tablet Take 2 tablets (1,000 mg total) by mouth 2 (two) times daily with a meal.  60 tablet  0  . acetaminophen (TYLENOL) 500 MG tablet Take 4,000 mg by mouth every 6 (six) hours as needed for headache.      . ibuprofen (ADVIL,MOTRIN) 200 MG tablet Take 200 mg by mouth every 6 (six) hours as needed.       No current facility-administered medications on file prior to visit.   Family History  Problem Relation Age of Onset  . Diabetes Mother   . Diabetes Father   . Cancer Maternal Aunt   . Heart disease Maternal Grandfather    History   Social History  . Marital Status: Single    Spouse Name: N/A    Number of Children: N/A  . Years of Education: N/A   Occupational History  . Not on file.   Social History Main Topics  . Smoking  status: Never Smoker   . Smokeless tobacco: Not on file  . Alcohol Use: No  . Drug Use: No  . Sexual Activity: Not on file   Other Topics Concern  . Not on file   Social History Narrative  . No narrative on file   Review of Systems  Constitutional: Positive for malaise/fatigue.  Eyes: Positive for blurred vision.  Respiratory: Negative.   Cardiovascular: Negative.   Gastrointestinal: Negative.   Genitourinary: Positive for frequency.  Musculoskeletal: Negative.   Neurological: Positive for dizziness, weakness and headaches. Negative for tingling.  Endo/Heme/Allergies: Positive for polydipsia.  Psychiatric/Behavioral: Negative.        Objective:   Filed Vitals:   08/15/14 0938  BP: 143/88  Pulse: 69  Temp: 98 F (36.7 C)  Resp: 16    Physical Exam: Constitutional: Patient appears well-developed and well-nourished. No distress. HENT: Normocephalic, atraumatic, External right and left ear normal. Oropharynx is clear and moist.  Eyes: Conjunctivae and EOM are normal. PERRLA, no scleral icterus. Neck: Normal ROM. Neck supple. No JVD. No tracheal deviation. No thyromegaly. CVS: RRR, S1/S2 +, no murmurs, no gallops, no carotid bruit.  Pulmonary: Effort and breath sounds normal, no stridor, rhonchi, wheezes, rales.  Abdominal: Soft. BS +,  no distension, tenderness, rebound or guarding.  Musculoskeletal: Normal range of motion. No edema and no tenderness.  Lymphadenopathy: No lymphadenopathy noted, cervical, inguinal or axillary Neuro: Alert. Normal reflexes, muscle tone coordination. No cranial nerve deficit. Skin: Skin is warm and dry. No rash noted. Not diaphoretic. No erythema. No pallor. Psychiatric: Normal mood and affect. Behavior, judgment, thought content normal.  Lab Results  Component Value Date   WBC 5.9 07/27/2014   HGB 15.5 07/27/2014   HCT 43.9 07/27/2014   MCV 82.7 07/27/2014   PLT 290 07/27/2014   Lab Results  Component Value Date   CREATININE 0.80  07/27/2014   BUN 15 07/27/2014   NA 135* 07/27/2014   K 4.3 07/27/2014   CL 97 07/27/2014   CO2 21 07/27/2014    Lab Results  Component Value Date   HGBA1C 10.9 08/15/2014   Lipid Panel     Component Value Date/Time   CHOL  Value: 164        ATP III CLASSIFICATION:  <200     mg/dL   Desirable  200-239  mg/dL   Borderline High  >=240    mg/dL   High        SLIGHT HEMOLYSIS 12/20/2009 1550   TRIG 145 SLIGHT HEMOLYSIS 12/20/2009 1550   HDL 42 SLIGHT HEMOLYSIS 12/20/2009 1550   CHOLHDL 3.9 12/20/2009 1550   VLDL 29 12/20/2009 1550   LDLCALC  Value: 93        Total Cholesterol/HDL:CHD Risk Coronary Heart Disease Risk Table                     Men   Women  1/2 Average Risk   3.4   3.3  Average Risk       5.0   4.4  2 X Average Risk   9.6   7.1  3 X Average Risk  23.4   11.0        Use the calculated Patient Ratio above and the CHD Risk Table to determine the patient's CHD Risk.        ATP III CLASSIFICATION (LDL):  <100     mg/dL   Optimal  100-129  mg/dL   Near or Above                    Optimal  130-159  mg/dL   Borderline  160-189  mg/dL   High  >190     mg/dL   Very High 12/20/2009 1550       Assessment and plan:   Taris was seen today for establish care.  Diagnoses and associated orders for this visit:  DM (diabetes mellitus) type 2, uncontrolled, with ketoacidosis - Glucose (CBG) - HgB A1c - POCT urinalysis dipstick - lisinopril (PRINIVIL,ZESTRIL) 5 MG tablet; Take 1 tablet (5 mg total) by mouth daily. - insulin detemir (LEVEMIR) 100 UNIT/ML injection; Inject 0.1 mLs (10 Units total) into the skin at bedtime. - metFORMIN (GLUCOPHAGE) 500 MG tablet; Take 2 tablets (1,000 mg total) by mouth 2 (two) times daily with a meal. - glucose blood test strip; Use as instructed - glucose monitoring kit (FREESTYLE) monitoring kit; 1 each by Does not apply route as needed for other. - Lancets (FREESTYLE) lancets; Use as instructed - INSULIN SYRINGE .5CC/28G 28G X 1/2" 0.5 ML MISC; Use as directed - Amb  Referral to Nutrition and Diabetic E - Insert peripheral IV with 1L NS.  Repeat dipstick was negative for ketones - POCT  glucose (manual entry) - Urinalysis Dipstick - CBC - COMPLETE METABOLIC PANEL WITH GFR - Lipid panel - TSH - Glucose (CBG)   Return in about 2 weeks (around 08/29/2014) for Nurse Visit-BP check and CBG and 3 mo PCP.  The patient was given clear instructions to go to ER or return to medical center if symptoms don't improve, worsen or new problems develop. The patient verbalized understanding.  Chari Manning, [facility] [phone] 08/20/2014, 1:53 PM

## 2014-08-15 NOTE — Patient Instructions (Addendum)
You will begin using Levemir 10 units every night.  You will need to begin checking your blood sugar before each meal and at bedtime.  You will need to return to the office in 2 weeks with blood sugar log in order to show the nurse.  We will make changes to your regimen based off your recorded numbers.  Please do not forget to bring.     Diabetes Mellitus and Food It is important for you to manage your blood sugar (glucose) level. Your blood glucose level can be greatly affected by what you eat. Eating healthier foods in the appropriate amounts throughout the day at about the same time each day will help you control your blood glucose level. It can also help slow or prevent worsening of your diabetes mellitus. Healthy eating may even help you improve the level of your blood pressure and reach or maintain a healthy weight.  HOW CAN FOOD AFFECT ME? Carbohydrates Carbohydrates affect your blood glucose level more than any other type of food. Your dietitian will help you determine how many carbohydrates to eat at each meal and teach you how to count carbohydrates. Counting carbohydrates is important to keep your blood glucose at a healthy level, especially if you are using insulin or taking certain medicines for diabetes mellitus. Alcohol Alcohol can cause sudden decreases in blood glucose (hypoglycemia), especially if you use insulin or take certain medicines for diabetes mellitus. Hypoglycemia can be a life-threatening condition. Symptoms of hypoglycemia (sleepiness, dizziness, and disorientation) are similar to symptoms of having too much alcohol.  If your health care provider has given you approval to drink alcohol, do so in moderation and use the following guidelines:  Women should not have more than one drink per day, and men should not have more than two drinks per day. One drink is equal to:  12 oz of beer.  5 oz of wine.  1 oz of hard liquor.  Do not drink on an empty stomach.  Keep  yourself hydrated. Have water, diet soda, or unsweetened iced tea.  Regular soda, juice, and other mixers might contain a lot of carbohydrates and should be counted. WHAT FOODS ARE NOT RECOMMENDED? As you make food choices, it is important to remember that all foods are not the same. Some foods have fewer nutrients per serving than other foods, even though they might have the same number of calories or carbohydrates. It is difficult to get your body what it needs when you eat foods with fewer nutrients. Examples of foods that you should avoid that are high in calories and carbohydrates but low in nutrients include:  Trans fats (most processed foods list trans fats on the Nutrition Facts label).  Regular soda.  Juice.  Candy.  Sweets, such as cake, pie, doughnuts, and cookies.  Fried foods. WHAT FOODS CAN I EAT? Have nutrient-rich foods, which will nourish your body and keep you healthy. The food you should eat also will depend on several factors, including:  The calories you need.  The medicines you take.  Your weight.  Your blood glucose level.  Your blood pressure level.  Your cholesterol level. You also should eat a variety of foods, including:  Protein, such as meat, poultry, fish, tofu, nuts, and seeds (lean animal proteins are best).  Fruits.  Vegetables.  Dairy products, such as milk, cheese, and yogurt (low fat is best).  Breads, grains, pasta, cereal, rice, and beans.  Fats such as olive oil, trans fat-free margarine, canola oil,  avocado, and olives. DOES EVERYONE WITH DIABETES MELLITUS HAVE THE SAME MEAL PLAN? Because every person with diabetes mellitus is different, there is not one meal plan that works for everyone. It is very important that you meet with a dietitian who will help you create a meal plan that is just right for you. Document Released: 08/27/2005 Document Revised: 12/05/2013 Document Reviewed: 10/27/2013 [facility] Patient Information 2015  [place], [place]. This information is not intended to replace advice given to you by your health care provider. Make sure you discuss any questions you have with your health care provider.

## 2014-08-15 NOTE — Progress Notes (Signed)
HFU Pt recently diagnosed with diabetes.

## 2014-08-16 ENCOUNTER — Telehealth: Payer: Self-pay | Admitting: Emergency Medicine

## 2014-08-16 NOTE — Telephone Encounter (Signed)
Left message for pt to call for lab results 

## 2014-08-16 NOTE — Telephone Encounter (Signed)
Message copied by Darlis Loan on Thu Aug 16, 2014 12:48 PM ------      Message from: [place] A      Created: Wed Aug 15, 2014 10:43 PM       Labs are normal except triglycerides are elevated.  Please educate patient on diet changes and exercise ------

## 2014-08-27 ENCOUNTER — Ambulatory Visit: Payer: Self-pay

## 2014-08-28 ENCOUNTER — Ambulatory Visit: Payer: Self-pay | Attending: Internal Medicine

## 2014-08-30 ENCOUNTER — Ambulatory Visit: Payer: Self-pay | Attending: Internal Medicine | Admitting: *Deleted

## 2014-08-30 DIAGNOSIS — E1110 Type 2 diabetes mellitus with ketoacidosis without coma: Secondary | ICD-10-CM

## 2014-08-30 DIAGNOSIS — R739 Hyperglycemia, unspecified: Secondary | ICD-10-CM

## 2014-08-30 DIAGNOSIS — E119 Type 2 diabetes mellitus without complications: Secondary | ICD-10-CM | POA: Insufficient documentation

## 2014-08-30 DIAGNOSIS — Z794 Long term (current) use of insulin: Secondary | ICD-10-CM | POA: Insufficient documentation

## 2014-08-30 DIAGNOSIS — E1310 Other specified diabetes mellitus with ketoacidosis without coma: Secondary | ICD-10-CM

## 2014-08-30 DIAGNOSIS — I10 Essential (primary) hypertension: Secondary | ICD-10-CM | POA: Insufficient documentation

## 2014-08-30 DIAGNOSIS — R7309 Other abnormal glucose: Secondary | ICD-10-CM

## 2014-08-30 DIAGNOSIS — Z79899 Other long term (current) drug therapy: Secondary | ICD-10-CM | POA: Insufficient documentation

## 2014-08-30 LAB — GLUCOSE, POCT (MANUAL RESULT ENTRY)
POC Glucose: 237 mg/dl — AB (ref 70–99)
POC Glucose: 316 mg/dl — AB (ref 70–99)

## 2014-08-30 MED ORDER — INSULIN ASPART 100 UNIT/ML ~~LOC~~ SOLN
10.0000 [IU] | Freq: Once | SUBCUTANEOUS | Status: AC
  Administered 2014-08-30: 10 [IU] via SUBCUTANEOUS

## 2014-08-30 NOTE — Progress Notes (Signed)
Patient presents for BP recheck and CBG check Patient states that he has been taking his lisinopril every AM States he has not needed to take levemir since his CBG has not been above 200. States CBG has been running 150's to 170's Patient notified of lab results and instructions. Discussed Diet and exercise changes and literature provided. Mother present.  BP 150/74   146/66 left arm manually P 72 SPO2 98%  CBG 316   10 units novolog insulin given per protocol.  CBG recheck 30 minutes after insulin administration was 237  After discussion with provider patient instructed to begin taking levemir every night at bedtime. Discussed that levemir is a long acting insulin that will help keep blood sugars under control. Patient knows to bring meter and/or CBG log to next visit. Also instructed to continue taking lisinopril as directed and to order refill well in advance so he doesn't go without. Patient and mom state understanding  Patient discharged to home in stable condition

## 2014-08-30 NOTE — Progress Notes (Signed)
Patient was notified of lab results and instructions at 2 week BP check.  Please see visit note from 08/30/14.

## 2014-08-30 NOTE — Patient Instructions (Signed)
DASH Eating Plan DASH stands for "Dietary Approaches to Stop Hypertension." The DASH eating plan is a healthy eating plan that has been shown to reduce high blood pressure (hypertension). Additional health benefits may include reducing the risk of type 2 diabetes mellitus, heart disease, and stroke. The DASH eating plan may also help with weight loss. WHAT DO I NEED TO KNOW ABOUT THE DASH EATING PLAN? For the DASH eating plan, you will follow these general guidelines:  Choose foods with a percent daily value for sodium of less than 5% (as listed on the food label).  Use salt-free seasonings or herbs instead of table salt or sea salt. Check with your health care provider or pharmacist before using salt substitutes.Diabetes Mellitus and Food It is important for you to manage your blood sugar (glucose) level. Your blood glucose level can be greatly affected by what you eat. Eating healthier foods in the appropriate amounts throughout the day at about the same time each day will help you control your blood glucose level. It can also help slow or prevent worsening of your diabetes mellitus. Healthy eating may even help you improve the level of your blood pressure and reach or maintain a healthy weight.  HOW CAN FOOD AFFECT ME? Carbohydrates Carbohydrates affect your blood glucose level more than any other type of food. Your dietitian will help you determine how many carbohydrates to eat at each meal and teach you how to count carbohydrates. Counting carbohydrates is important to keep your blood glucose at a healthy level, especially if you are using insulin or taking certain medicines for diabetes mellitus. Alcohol Alcohol can cause sudden decreases in blood glucose (hypoglycemia), especially if you use insulin or take certain medicines for diabetes mellitus. Hypoglycemia can be a life-threatening condition. Symptoms of hypoglycemia (sleepiness, dizziness, and disorientation) are similar to symptoms of  having too much alcohol.  If your health care provider has given you approval to drink alcohol, do so in moderation and use the following guidelines: Women should not have more than one drink per day, and men should not have more than two drinks per day. One drink is equal to: 12 oz of beer. 5 oz of wine. 1 oz of hard liquor. Do not drink on an empty stomach. Keep yourself hydrated. Have water, diet soda, or unsweetened iced tea. Regular soda, juice, and other mixers might contain a lot of carbohydrates and should be counted. WHAT FOODS ARE NOT RECOMMENDED? As you make food choices, it is important to remember that all foods are not the same. Some foods have fewer nutrients per serving than other foods, even though they might have the same number of calories or carbohydrates. It is difficult to get your body what it needs when you eat foods with fewer nutrients. Examples of foods that you should avoid that are high in calories and carbohydrates but low in nutrients include: Trans fats (most processed foods list trans fats on the Nutrition Facts label). Regular soda. Juice. Candy. Sweets, such as cake, pie, doughnuts, and cookies. Fried foods. WHAT FOODS CAN I EAT? Have nutrient-rich foods, which will nourish your body and keep you healthy. The food you should eat also will depend on several factors, including: The calories you need. The medicines you take. Your weight. Your blood glucose level. Your blood pressure level. Your cholesterol level. You also should eat a variety of foods, including: Protein, such as meat, poultry, fish, tofu, nuts, and seeds (lean animal proteins are best). Fruits. Vegetables. Dairy products,  such as milk, cheese, and yogurt (low fat is best). Breads, grains, pasta, cereal, rice, and beans. Fats such as olive oil, trans fat-free margarine, canola oil, avocado, and olives. DOES EVERYONE WITH DIABETES MELLITUS HAVE THE SAME MEAL PLAN? Because every person  with diabetes mellitus is different, there is not one meal plan that works for everyone. It is very important that you meet with a dietitian who will help you create a meal plan that is just right for you. Document Released: 08/27/2005 Document Revised: 12/05/2013 Document Reviewed: 10/27/2013 [facility] Patient Information 2015 [place], [place]. This information is not intended to replace advice given to you by your health care provider. Make sure you discuss any questions you have with your health care provider. Diabetes and Exercise Exercising regularly is important. It is not just about losing weight. It has many health benefits, such as: Improving your overall fitness, flexibility, and endurance. Increasing your bone density. Helping with weight control. Decreasing your body fat. Increasing your muscle strength. Reducing stress and tension. Improving your overall health. People with diabetes who exercise gain additional benefits because exercise: Reduces appetite. Improves the body's use of blood sugar (glucose). Helps lower or control blood glucose. Decreases blood pressure. Helps control blood lipids (such as cholesterol and triglycerides). Improves the body's use of the hormone insulin by: Increasing the body's insulin sensitivity. Reducing the body's insulin needs. Decreases the risk for heart disease because exercising: Lowers cholesterol and triglycerides levels. Increases the levels of good cholesterol (such as high-density lipoproteins [HDL]) in the body. Lowers blood glucose levels. YOUR ACTIVITY PLAN  Choose an activity that you enjoy and set realistic goals. Your health care provider or diabetes educator can help you make an activity plan that works for you. Exercise regularly as directed by your health care provider. This includes: Performing resistance training twice a week such as push-ups, sit-ups, lifting weights, or using resistance bands. Performing 150 minutes of  cardio exercises each week such as walking, running, or playing sports. Staying active and spending no more than 90 minutes at one time being inactive. Even short bursts of exercise are good for you. Three 10-minute sessions spread throughout the day are just as beneficial as a single 30-minute session. Some exercise ideas include: Taking the dog for a walk. Taking the stairs instead of the elevator. Dancing to your favorite song. Doing an exercise video. Doing your favorite exercise with a friend. RECOMMENDATIONS FOR EXERCISING WITH TYPE 1 OR TYPE 2 DIABETES  Check your blood glucose before exercising. If blood glucose levels are greater than 240 mg/dL, check for urine ketones. Do not exercise if ketones are present. Avoid injecting insulin into areas of the body that are going to be exercised. For example, avoid injecting insulin into: The arms when playing tennis. The legs when jogging. Keep a record of: Food intake before and after you exercise. Expected peak times of insulin action. Blood glucose levels before and after you exercise. The type and amount of exercise you have done. Review your records with your health care provider. Your health care provider will help you to develop guidelines for adjusting food intake and insulin amounts before and after exercising. If you take insulin or oral hypoglycemic agents, watch for signs and symptoms of hypoglycemia. They include: Dizziness. Shaking. Sweating. Chills. Confusion. Drink plenty of water while you exercise to prevent dehydration or heat stroke. Body water is lost during exercise and must be replaced. Talk to your health care provider before starting an exercise program to  make sure it is safe for you. Remember, almost any type of activity is better than none. Document Released: 02/20/2004 Document Revised: 04/16/2014 Document Reviewed: 05/09/2013 [facility] Patient Information 2015 [place], [place]. This information is not intended  to replace advice given to you by your health care provider. Make sure you discuss any questions you have with your health care provider.    Eat lower-sodium products, often labeled as "lower sodium" or "no salt added."  Eat fresh foods.  Eat more vegetables, fruits, and low-fat dairy products.  Choose whole grains. Look for the word "whole" as the first word in the ingredient list.  Choose fish and skinless chicken or [place] more often than red meat. Limit fish, poultry, and meat to 6 oz (170 g) each day.  Limit sweets, desserts, sugars, and sugary drinks.  Choose heart-healthy fats.  Limit cheese to 1 oz (28 g) per day.  Eat more home-cooked food and less restaurant, buffet, and fast food.  Limit fried foods.  Cook foods using methods other than frying.  Limit canned vegetables. If you do use them, rinse them well to decrease the sodium.  When eating at a restaurant, ask that your food be prepared with less salt, or no salt if possible. WHAT FOODS CAN I EAT? Seek help from a dietitian for individual calorie needs. Grains Whole grain or whole wheat bread. Brown rice. Whole grain or whole wheat pasta. Quinoa, bulgur, and whole grain cereals. Low-sodium cereals. Corn or whole wheat flour tortillas. Whole grain cornbread. Whole grain crackers. Low-sodium crackers. Vegetables Fresh or frozen vegetables (raw, steamed, roasted, or grilled). Low-sodium or reduced-sodium tomato and vegetable juices. Low-sodium or reduced-sodium tomato sauce and paste. Low-sodium or reduced-sodium canned vegetables.  Fruits All fresh, canned (in natural juice), or frozen fruits. Meat and Other Protein Products Ground beef (85% or leaner), grass-fed beef, or beef trimmed of fat. Skinless chicken or [place]. Ground chicken or [place]. Pork trimmed of fat. All fish and seafood. Eggs. Dried beans, peas, or lentils. Unsalted nuts and seeds. Unsalted canned beans. Dairy Low-fat dairy products, such as skim or  1% milk, 2% or reduced-fat cheeses, low-fat ricotta or cottage cheese, or plain low-fat yogurt. Low-sodium or reduced-sodium cheeses. Fats and Oils Tub margarines without trans fats. Light or reduced-fat mayonnaise and salad dressings (reduced sodium). Avocado. Safflower, olive, or canola oils. Natural peanut or almond butter. Other Unsalted popcorn and pretzels. The items listed above may not be a complete list of recommended foods or beverages. Contact your dietitian for more options. WHAT FOODS ARE NOT RECOMMENDED? Grains White bread. White pasta. White rice. Refined cornbread. Bagels and croissants. Crackers that contain trans fat. Vegetables Creamed or fried vegetables. Vegetables in a cheese sauce. Regular canned vegetables. Regular canned tomato sauce and paste. Regular tomato and vegetable juices. Fruits Dried fruits. Canned fruit in light or heavy syrup. Fruit juice. Meat and Other Protein Products Fatty cuts of meat. Ribs, chicken wings, bacon, sausage, bologna, salami, chitterlings, fatback, hot dogs, bratwurst, and packaged luncheon meats. Salted nuts and seeds. Canned beans with salt. Dairy Whole or 2% milk, cream, half-and-half, and cream cheese. Whole-fat or sweetened yogurt. Full-fat cheeses or blue cheese. Nondairy creamers and whipped toppings. Processed cheese, cheese spreads, or cheese curds. Condiments Onion and garlic salt, seasoned salt, table salt, and sea salt. Canned and packaged gravies. Worcestershire sauce. Tartar sauce. Barbecue sauce. Teriyaki sauce. Soy sauce, including reduced sodium. Steak sauce. Fish sauce. Oyster sauce. Cocktail sauce. Horseradish. Ketchup and mustard. Meat flavorings and tenderizers.  Bouillon cubes. Hot sauce. Tabasco sauce. Marinades. Taco seasonings. Relishes. Fats and Oils Butter, stick margarine, lard, shortening, ghee, and bacon fat. Coconut, palm kernel, or palm oils. Regular salad dressings. Other Pickles and olives. Salted popcorn  and pretzels. The items listed above may not be a complete list of foods and beverages to avoid. Contact your dietitian for more information. WHERE CAN I FIND MORE INFORMATION? National [facility], [employer], [facility]: [employer] Document Released: 11/19/2011 Document Revised: 04/16/2014 Document Reviewed: 10/04/2013 [facility] Patient Information 2015 [place], [place]. This information is not intended to replace advice given to you by your health care provider. Make sure you discuss any questions you have with your health care provider.

## 2014-09-10 ENCOUNTER — Encounter: Payer: Self-pay | Admitting: *Deleted

## 2014-09-10 ENCOUNTER — Encounter: Payer: Self-pay | Attending: Internal Medicine | Admitting: *Deleted

## 2014-09-10 VITALS — Ht 72.0 in | Wt 237.8 lb

## 2014-09-10 DIAGNOSIS — Z713 Dietary counseling and surveillance: Secondary | ICD-10-CM | POA: Insufficient documentation

## 2014-09-10 DIAGNOSIS — E119 Type 2 diabetes mellitus without complications: Secondary | ICD-10-CM | POA: Insufficient documentation

## 2014-09-10 DIAGNOSIS — E1110 Type 2 diabetes mellitus with ketoacidosis without coma: Secondary | ICD-10-CM

## 2014-09-10 DIAGNOSIS — Z794 Long term (current) use of insulin: Secondary | ICD-10-CM | POA: Insufficient documentation

## 2014-09-10 NOTE — Patient Instructions (Signed)
Nutrition: Follow meal plan discussed;General guidelines for healthy choices and portions discussed;Adjust meds/carbs with exercise as discussed Physical Activity: Exercise 3-5 times per week (Running and soccer) Medications: take my medication as prescribed Monitoring : test my blood glucose as discussed (note x per day with comment) (twice daily, fasting and before bed)

## 2014-09-10 NOTE — Progress Notes (Signed)
Diabetes Self-Management Education  Visit Type:    Appt. Start Time: 11am Appt. End Time: 12pm  09/10/2014  Mr. [facility], identified by name and date of birth, is a 23 y.o. male with a diagnosis of Diabetes: Type 2.  Other people present during visit:  Patient;Parent   ASSESSMENT  Height 6' (1.829 m), weight 237 lb 12.8 oz (107.865 kg). Body mass index is 32.24 kg/(m^2).  Initial Visit Information:  Are you currently following a meal plan?: No   Are you taking your medications as prescribed?: Yes Are you checking your feet?: No   How often do you need to have someone help you when you read instructions, pamphlets, or other written materials from your doctor or pharmacy?: 1 - Never    Patient with newly diagnosed type 2 diabetes with an A1c of 10.9. He is on metformin 1000 mg twice daily and 10 units of Levemir at bedtime. His mother, who is present today, also has diabetes, and has tried to teach patient about eating healthy and portion control. Patient reports that he does not eat consistent meal, often not eating lunch, but snacking during the afternoon and later in the evening.   Psychosocial:     Patient Belief/Attitude about Diabetes: Motivated to manage diabetes Self-care barriers: None Self-management support: Family;Doctor's office Other persons present: Patient;Parent Patient Concerns: Nutrition/Meal planning;Glycemic Control;Monitoring;Medication;Healthy Lifestyle Special Needs: None Preferred Learning Style: Auditory;Visual Learning Readiness: Ready  Complications:   Last HgB A1C per patient/outside source: 10.9 mg/dL How often do you check your blood sugar?: 1-2 times/day Fasting Blood glucose range (mg/dL): 161-096;045-409 Postprandial Blood glucose range (mg/dL): 811-914;782-956 Number of hypoglycemic episodes per month: 0 Have you had a dilated eye exam in the past 12 months?: No Have you had a dental exam in the past 12 months?: No  Diet  Intake:  Breakfast: Eggs, pancakes OR toast, eggs, water, milk Snack (morning): None Lunch: Skips Snack (afternoon): 1-2 PBJ/cheese sandwich, leftover rice, chips Dinner: Meat, rice, salad/vegetables, plantains, water Snack (evening): Cake, cookies, Doritos, leftovers Beverage(s): Water  Exercise:  Exercise: Strenuous (running) Strenous Exercise amount of time (min / week): 120 (Soccer on weekends, was running daily until about 3 weeks ago)  Individualized Plan for Diabetes Self-Management Training:   Learning Objective:  Patient will have a greater understanding of diabetes self-management.  Patient education plan per assessed needs and concerns is to attend individual sessions for     Education Topics Reviewed with Patient Today:  Definition of diabetes, type 1 and 2, and the diagnosis of diabetes Role of diet in the treatment of diabetes and the relationship between the three main macronutrients and blood glucose level;Food label reading, portion sizes and measuring food.;Carbohydrate counting;Reviewed blood glucose goals for pre and post meals and how to evaluate the patients' food intake on their blood glucose level.;Meal timing in regards to the patients' current diabetes medication. Role of exercise on diabetes management, blood pressure control and cardiac health. Reviewed patients medication for diabetes, action, purpose, timing of dose and side effects. Identified appropriate SMBG and/or A1C goals.;Taught/discussed recording of test results and interpretation of SMBG.   Relationship between chronic complications and blood glucose control;Lipid levels, blood glucose control and heart disease Worked with patient to identify barriers to care and solutions;Identified and addressed patients feelings and concerns about diabetes   Lifestyle issues that need to be addressed for better diabetes care  PATIENTS GOALS/Plan (Developed by the patient):  Nutrition: Follow meal plan  discussed;General guidelines for healthy choices and  portions discussed;Adjust meds/carbs with exercise as discussed  3-4 carb servings per meal, 1 serving per snack.  Monitor portion size of carb foods  Eat 3 meals daily with 1-2 planned snacks.  Physical Activity: Exercise 3-5 times per week (Running and soccer) Medications: take my medication as prescribed Monitoring : test my blood glucose as discussed (note x per day with comment) (twice daily, fasting and before bed)  Expected Outcomes:  Demonstrated interest in learning. Expect positive outcomes  Education material provided: Living Well with Diabetes and Meal plan card  If problems or questions, patient to contact team via:  Phone  Future DSME appointment: 2 months

## 2014-09-19 ENCOUNTER — Other Ambulatory Visit: Payer: Self-pay | Admitting: Internal Medicine

## 2014-09-19 DIAGNOSIS — E1110 Type 2 diabetes mellitus with ketoacidosis without coma: Secondary | ICD-10-CM

## 2014-09-19 MED ORDER — INSULIN DETEMIR 100 UNIT/ML ~~LOC~~ SOLN: 10.0000 [IU] | Freq: Every day | SUBCUTANEOUS | Status: DC

## 2014-11-13 ENCOUNTER — Ambulatory Visit: Payer: Self-pay | Admitting: *Deleted

## 2014-11-23 ENCOUNTER — Encounter: Payer: Self-pay | Admitting: Internal Medicine

## 2014-11-23 ENCOUNTER — Ambulatory Visit: Payer: Self-pay | Attending: Internal Medicine | Admitting: Internal Medicine

## 2014-11-23 VITALS — BP 133/87 | HR 75 | Temp 98.0°F | Resp 16 | Ht 72.0 in | Wt 243.0 lb

## 2014-11-23 DIAGNOSIS — Z9114 Patient's other noncompliance with medication regimen: Secondary | ICD-10-CM | POA: Insufficient documentation

## 2014-11-23 DIAGNOSIS — E119 Type 2 diabetes mellitus without complications: Secondary | ICD-10-CM | POA: Insufficient documentation

## 2014-11-23 DIAGNOSIS — Z794 Long term (current) use of insulin: Secondary | ICD-10-CM | POA: Insufficient documentation

## 2014-11-23 DIAGNOSIS — Z9111 Patient's noncompliance with dietary regimen: Secondary | ICD-10-CM | POA: Insufficient documentation

## 2014-11-23 DIAGNOSIS — R0602 Shortness of breath: Secondary | ICD-10-CM | POA: Insufficient documentation

## 2014-11-23 LAB — POCT GLYCOSYLATED HEMOGLOBIN (HGB A1C): HEMOGLOBIN A1C: 9.5

## 2014-11-23 LAB — GLUCOSE, POCT (MANUAL RESULT ENTRY): POC GLUCOSE: 201 mg/dL — AB (ref 70–99)

## 2014-11-23 MED ORDER — METFORMIN HCL 1000 MG PO TABS: 1000.0000 mg | ORAL_TABLET | Freq: Two times a day (BID) | ORAL | Status: DC

## 2014-11-23 MED ORDER — LISINOPRIL 5 MG PO TABS: 5.0000 mg | ORAL_TABLET | Freq: Every day | ORAL | Status: DC

## 2014-11-23 MED ORDER — INSULIN DETEMIR 100 UNIT/ML ~~LOC~~ SOLN: 10.0000 [IU] | Freq: Every day | SUBCUTANEOUS | Status: DC

## 2014-11-23 MED ORDER — METHYLPREDNISOLONE SODIUM SUCC 125 MG IJ SOLR
60.0000 mg | Freq: Once | INTRAMUSCULAR | Status: AC
  Administered 2014-11-23: 60 mg via INTRAMUSCULAR

## 2014-11-23 MED ORDER — ALBUTEROL SULFATE HFA 108 (90 BASE) MCG/ACT IN AERS: 2.0000 | INHALATION_SPRAY | Freq: Four times a day (QID) | RESPIRATORY_TRACT | Status: DC | PRN

## 2014-11-23 NOTE — Progress Notes (Signed)
Patient ID: Ernest Mccann, male   DOB: [birth year], 23 y.o.   MRN: [id number]  SUBJECTIVE: 23 y.o. male for follow up of diabetes (T2DM). Diabetic Review of Systems - medication compliance: noncompliant much of the time, reports that he dropped his insulin vial and has been out for one month, diabetic diet compliance: noncompliant much of the time, further diabetic ROS: no polyuria or polydipsia, no chest pain, dyspnea or TIA's, no numbness, tingling or pain in extremities, last eye exam approximately n/a ago.  Other symptoms and concerns: SOB for a couple of weeks, more noticed at nighttime.  He reports that he has used his brother inhaler and nebulizer.  Current Outpatient Prescriptions  Medication Sig Dispense Refill  . glucose blood test strip Use as instructed 100 each 12  . glucose monitoring kit (FREESTYLE) monitoring kit 1 each by Does not apply route as needed for other. 1 each 0  . insulin detemir (LEVEMIR) 100 UNIT/ML injection Inject 0.1 mLs (10 Units total) into the skin at bedtime. 10 mL 3  . INSULIN SYRINGE .5CC/28G 28G X 1/2" 0.5 ML MISC Use as directed 100 each 2  . Lancets (FREESTYLE) lancets Use as instructed 100 each 12  . lisinopril (PRINIVIL,ZESTRIL) 5 MG tablet Take 1 tablet (5 mg total) by mouth daily. 90 tablet 3  . metFORMIN (GLUCOPHAGE) 500 MG tablet Take 2 tablets (1,000 mg total) by mouth 2 (two) times daily with a meal. 180 tablet 3  . acetaminophen (TYLENOL) 500 MG tablet Take 4,000 mg by mouth every 6 (six) hours as needed for headache.    . ibuprofen (ADVIL,MOTRIN) 200 MG tablet Take 200 mg by mouth every 6 (six) hours as needed.     No current facility-administered medications for this visit.    OBJECTIVE: Appearance: alert, well appearing, and in no distress, oriented to person, place, and time and normal appearing weight. BP 133/87 mmHg  Pulse 75  Temp(Src) 98 F (36.7 C) (Oral)  Resp 16  Ht 6' (1.829 m)  Wt 243 lb (110.224 kg)  BMI 32.95 kg/m2   SpO2 98%  Exam: heart sounds normal rate, regular rhythm, normal S1, S2, no murmurs, rubs, clicks or gallops, normal bilateral carotid upstroke without bruits, no JVD, chest clear, no hepatosplenomegaly, no carotid bruits, feet: warm, good capillary refill, normal DP and PT pulses, normal monofilament exam and normal sensory exam  ASSESSMENT: Diabetes Mellitus: stable and needs improvement  PLAN: See orders for this visit as documented in the electronic medical record. Issues reviewed with him: diabetic diet discussed in detail, written exchange diet given, low cholesterol diet, weight control and daily exercise discussed, home glucose monitoring emphasized, foot care discussed and Podiatry visits discussed, annual eye examinations at Ophthalmology discussed and long term diabetic complications discussed.      Assessment and plan:   Ernest Mccann was seen today for follow-up.  Diagnoses and associated orders for this visit:  Type 2 diabetes mellitus without complication - Glucose (CBG) - HgB A1c - Refill insulin detemir (LEVEMIR) 100 UNIT/ML injection; Inject 0.1 mLs (10 Units total) into the skin at bedtime. - Refill lisinopril (PRINIVIL,ZESTRIL) 5 MG tablet; Take 1 tablet (5 mg total) by mouth daily. - Refill metFORMIN (GLUCOPHAGE) 1000 MG tablet; Take 1 tablet (1,000 mg total) by mouth 2 (two) times daily with a meal. - Cancel: Microalbumin, urine--patient wasn unable to give urine specimen  SOB (shortness of breath) - albuterol (PROVENTIL HFA;VENTOLIN HFA) 108 (90 BASE) MCG/ACT inhaler; Inhale 2 puffs into the lungs  every 6 (six) hours as needed for wheezing or shortness of breath. - methylPREDNISolone sodium succinate (SOLU-MEDROL) 125 mg/2 mL injection 60 mg; Inject 0.96 mLs (60 mg total) into the muscle once. Encouraged weight loss and regular exercise  Return for 2 weeks and 4 week RN visit-CBg log and 3 mo PCP.        Chari Manning, NP-C [facility] [phone] 11/23/2014, 2:54 PM

## 2014-11-23 NOTE — Patient Instructions (Signed)
Diabetes and Standards of Medical Care Diabetes is complicated. You may find that your diabetes team includes a dietitian, nurse, diabetes educator, eye doctor, and more. To help everyone know what is going on and to help you get the care you deserve, the following schedule of care was developed to help keep you on track. Below are the tests, exams, vaccines, medicines, education, and plans you will need. HbA1c test This test shows how well you have controlled your glucose over the past 2-3 months. It is used to see if your diabetes management plan needs to be adjusted.   It is performed at least 2 times a year if you are meeting treatment goals.  It is performed 4 times a year if therapy has changed or if you are not meeting treatment goals. Blood pressure test  This test is performed at every routine medical visit. The goal is less than 140/90 mm Hg for most people, but 130/80 mm Hg in some cases. Ask your health care provider about your goal. Dental exam  Follow up with the dentist regularly. Eye exam  If you are diagnosed with type 1 diabetes as a child, get an exam upon reaching the age of 37 years or older and have had diabetes for 3-5 years. Yearly eye exams are recommended after that initial eye exam.  If you are diagnosed with type 1 diabetes as an adult, get an exam within 5 years of diagnosis and then yearly.  If you are diagnosed with type 2 diabetes, get an exam as soon as possible after the diagnosis and then yearly. Foot care exam  Visual foot exams are performed at every routine medical visit. The exams check for cuts, injuries, or other problems with the feet.  A comprehensive foot exam should be done yearly. This includes visual inspection as well as assessing foot pulses and testing for loss of sensation.  Check your feet nightly for cuts, injuries, or other problems with your feet. Tell your health care provider if anything is not healing. Kidney function test (urine  microalbumin)  This test is performed once a year.  Type 1 diabetes: The first test is performed 5 years after diagnosis.  Type 2 diabetes: The first test is performed at the time of diagnosis.  A serum creatinine and estimated glomerular filtration rate (eGFR) test is done once a year to assess the level of chronic kidney disease (CKD), if present. Lipid profile (cholesterol, HDL, LDL, triglycerides)  Performed every 5 years for most people.  The goal for LDL is less than 100 mg/dL. If you are at high risk, the goal is less than 70 mg/dL.  The goal for HDL is 40 mg/dL-50 mg/dL for men and 50 mg/dL-60 mg/dL for women. An HDL cholesterol of 60 mg/dL or higher gives some protection against heart disease.  The goal for triglycerides is less than 150 mg/dL. Influenza vaccine, pneumococcal vaccine, and hepatitis B vaccine  The influenza vaccine is recommended yearly.  It is recommended that people with diabetes who are over 22 years old get the pneumonia vaccine. In some cases, two separate shots may be given. Ask your health care provider if your pneumonia vaccination is up to date.  The hepatitis B vaccine is also recommended for adults with diabetes. Diabetes self-management education  Education is recommended at diagnosis and ongoing as needed. Treatment plan  Your treatment plan is reviewed at every medical visit. Document Released: 09/27/2009 Document Revised: 04/16/2014 Document Reviewed: 05/02/2013 [facility] Patient Information 2015 [place],  LL[place]. This information is not intended to replace advice given to you by your health care provider. Make sure you discuss any questions you have with your health care provider.  

## 2014-11-23 NOTE — Progress Notes (Signed)
Pt is here following up on his diabetes. Pt states the he has had SOB and has had to use a rescue inhaler and a nebulizer.

## 2014-11-30 ENCOUNTER — Other Ambulatory Visit: Payer: Self-pay

## 2014-12-12 ENCOUNTER — Other Ambulatory Visit: Payer: Self-pay | Admitting: Internal Medicine

## 2014-12-12 DIAGNOSIS — R0602 Shortness of breath: Secondary | ICD-10-CM

## 2014-12-12 MED ORDER — ALBUTEROL SULFATE HFA 108 (90 BASE) MCG/ACT IN AERS
2.0000 | INHALATION_SPRAY | Freq: Four times a day (QID) | RESPIRATORY_TRACT | Status: DC | PRN
Start: 2014-12-12 — End: 2015-01-30

## 2014-12-21 ENCOUNTER — Other Ambulatory Visit: Payer: Self-pay

## 2015-01-30 ENCOUNTER — Other Ambulatory Visit: Payer: Self-pay | Admitting: Internal Medicine

## 2015-01-30 DIAGNOSIS — R0602 Shortness of breath: Secondary | ICD-10-CM

## 2015-01-30 MED ORDER — ALBUTEROL SULFATE HFA 108 (90 BASE) MCG/ACT IN AERS: 2.0000 | INHALATION_SPRAY | Freq: Four times a day (QID) | RESPIRATORY_TRACT | Status: DC | PRN

## 2015-06-13 ENCOUNTER — Encounter (HOSPITAL_COMMUNITY): Payer: Self-pay | Admitting: Emergency Medicine

## 2015-06-13 ENCOUNTER — Emergency Department (HOSPITAL_COMMUNITY)
Admission: EM | Admit: 2015-06-13 | Discharge: 2015-06-13 | Disposition: A | Discharge status: Home / Self Care | Payer: Self-pay | Attending: Emergency Medicine | Admitting: Emergency Medicine

## 2015-06-13 ENCOUNTER — Emergency Department (HOSPITAL_COMMUNITY): Payer: Self-pay

## 2015-06-13 DIAGNOSIS — R079 Chest pain, unspecified: Secondary | ICD-10-CM

## 2015-06-13 DIAGNOSIS — L97521 Non-pressure chronic ulcer of other part of left foot limited to breakdown of skin: Secondary | ICD-10-CM | POA: Insufficient documentation

## 2015-06-13 DIAGNOSIS — R0789 Other chest pain: Secondary | ICD-10-CM | POA: Insufficient documentation

## 2015-06-13 DIAGNOSIS — E119 Type 2 diabetes mellitus without complications: Secondary | ICD-10-CM | POA: Insufficient documentation

## 2015-06-13 DIAGNOSIS — Z79899 Other long term (current) drug therapy: Secondary | ICD-10-CM | POA: Insufficient documentation

## 2015-06-13 LAB — I-STAT CHEM 8, ED
BUN: 13 mg/dL (ref 6–20)
CHLORIDE: 103 mmol/L (ref 101–111)
CREATININE: 0.8 mg/dL (ref 0.61–1.24)
Calcium, Ion: 1.18 mmol/L (ref 1.12–1.23)
GLUCOSE: 217 mg/dL — AB (ref 65–99)
HCT: 44 % (ref 39.0–52.0)
HEMOGLOBIN: 15 g/dL (ref 13.0–17.0)
Potassium: 3.8 mmol/L (ref 3.5–5.1)
SODIUM: 139 mmol/L (ref 135–145)
TCO2: 22 mmol/L (ref 0–100)

## 2015-06-13 LAB — CBG MONITORING, ED: Glucose-Capillary: 240 mg/dL — ABNORMAL HIGH (ref 65–99)

## 2015-06-13 MED ORDER — BACITRACIN 500 UNIT/GM EX OINT
1.0000 "application " | TOPICAL_OINTMENT | Freq: Two times a day (BID) | CUTANEOUS | Status: DC
  Administered 2015-06-13: 1 via TOPICAL
  Filled 2015-06-13 (×2): qty 0.9

## 2015-06-13 NOTE — Discharge Instructions (Signed)
Chest Wall Pain Chest wall pain is pain in or around the bones and muscles of your chest. It may take up to 6 weeks to get better. It may take longer if you must stay physically active in your work and activities.  CAUSES  Chest wall pain may happen on its own. However, it may be caused by:  A viral illness like the flu.  Injury.  Coughing.  Exercise.  Arthritis.  Fibromyalgia.  Shingles. HOME CARE INSTRUCTIONS   Avoid overtiring physical activity. Try not to strain or perform activities that cause pain. This includes any activities using your chest or your abdominal and side muscles, especially if heavy weights are used.  Put ice on the sore area.  Put ice in a plastic bag.  Place a towel between your skin and the bag.  Leave the ice on for 15-20 minutes per hour while awake for the first 2 days.  Only take over-the-counter or prescription medicines for pain, discomfort, or fever as directed by your caregiver. SEEK IMMEDIATE MEDICAL CARE IF:   Your pain increases, or you are very uncomfortable.  You have a fever.  Your chest pain becomes worse.  You have new, unexplained symptoms.  You have nausea or vomiting.  You feel sweaty or lightheaded.  You have a cough with phlegm (sputum), or you cough up blood. MAKE SURE YOU:   Understand these instructions.  Will watch your condition.  Will get help right away if you are not doing well or get worse. Document Released: 11/30/2005 Document Revised: 02/22/2012 Document Reviewed: 07/27/2011 [facility] Patient Information 2015 [place], [place]. This information is not intended to replace advice given to you by your health care provider. Make sure you discuss any questions you have with your health care provider. Diabetes and Foot Care Diabetes may cause you to have problems because of poor blood supply (circulation) to your feet and legs. This may cause the skin on your feet to become thinner, break easier, and heal more  slowly. Your skin may become dry, and the skin may peel and crack. You may also have nerve damage in your legs and feet causing decreased feeling in them. You may not notice minor injuries to your feet that could lead to infections or more serious problems. Taking care of your feet is one of the most important things you can do for yourself.  HOME CARE INSTRUCTIONS  Wear shoes at all times, even in the house. Do not go barefoot. Bare feet are easily injured.  Check your feet daily for blisters, cuts, and redness. If you cannot see the bottom of your feet, use a mirror or ask someone for help.  Wash your feet with warm water (do not use hot water) and mild soap. Then pat your feet and the areas between your toes until they are completely dry. Do not soak your feet as this can dry your skin.  Apply a moisturizing lotion or petroleum jelly (that does not contain alcohol and is unscented) to the skin on your feet and to dry, brittle toenails. Do not apply lotion between your toes.  Trim your toenails straight across. Do not dig under them or around the cuticle. File the edges of your nails with an emery board or nail file.  Do not cut corns or calluses or try to remove them with medicine.  Wear clean socks or stockings every day. Make sure they are not too tight. Do not wear knee-high stockings since they may decrease blood flow  to your legs.  Wear shoes that fit properly and have enough cushioning. To break in new shoes, wear them for just a few hours a day. This prevents you from injuring your feet. Always look in your shoes before you put them on to be sure there are no objects inside.  Do not cross your legs. This may decrease the blood flow to your feet.  If you find a minor scrape, cut, or break in the skin on your feet, keep it and the skin around it clean and dry. These areas may be cleansed with mild soap and water. Do not cleanse the area with peroxide, alcohol, or iodine.  When you  remove an adhesive bandage, be sure not to damage the skin around it.  If you have a wound, look at it several times a day to make sure it is healing.  Do not use heating pads or hot water bottles. They may burn your skin. If you have lost feeling in your feet or legs, you may not know it is happening until it is too late.  Make sure your health care provider performs a complete foot exam at least annually or more often if you have foot problems. Report any cuts, sores, or bruises to your health care provider immediately. SEEK MEDICAL CARE IF:   You have an injury that is not healing.  You have cuts or breaks in the skin.  You have an ingrown nail.  You notice redness on your legs or feet.  You feel burning or tingling in your legs or feet.  You have pain or cramps in your legs and feet.  Your legs or feet are numb.  Your feet always feel cold. SEEK IMMEDIATE MEDICAL CARE IF:   There is increasing redness, swelling, or pain in or around a wound.  There is a red line that goes up your leg.  Pus is coming from a wound.  You develop a fever or as directed by your health care provider.  You notice a bad smell coming from an ulcer or wound. Document Released: 11/27/2000 Document Revised: 08/02/2013 Document Reviewed: 05/09/2013 [facility] Patient Information 2015 [place]. This information is not intended to replace advice given to you by your health care provider. Make sure you discuss any questions you have with your health care provider.

## 2015-06-13 NOTE — Discharge Planning (Signed)
[facility] [phone]  Spoke with patient regarding primary care resources and the [facility] orange card. [facility], but pt has not been seen there in some time. Follow up appointment made with the clinic for Friday July 1,2016 at 12:00pm, pt verbalized understanding of this appointment. An appointment also made with the financial assistance counselor for July 26,2016 at 3:30pm. Orange card application provided. My contact information also given for any future questions or concerns. No other Community Health & Eligibility Specialist needs identified at this time.

## 2015-06-13 NOTE — ED Provider Notes (Signed)
CSN: [id number]     Arrival date & time 06/13/15  1348 History   First MD Initiated Contact with Patient 06/13/15 1402     Chief Complaint  Patient presents with  . Chest Pain     (Consider location/radiation/quality/duration/timing/severity/associated sxs/prior Treatment) HPI Comments: Patient presents emergency department with chief complaint of 2 complaints.  1. Foot sore: patient was playing soccer a couple of days ago and developed a sore on his foot.  He was playing barefoot.  He has a history of DM and is concerned that it is infected.  He denies fevers, chills, or discharge.  He has not been managing his diabetes.  He has been applying peroxide and bacitracin and keeping the wound covered.  2. Chest pain:  States that he noticed the chest pain yesterday.  Denies SOB.  Chest pain is worsened with palpation of the anterior chest.  Patient states that he hasn't worked out in quite some time, and could be related to being active the past few days.  Denies any associated cough, fevers, or chills.  Has not taken anything for these symptoms.  The history is provided by the patient. No language interpreter was used.    Past Medical History  Diagnosis Date  . Diabetes mellitus without complication    Past Surgical History  Procedure Laterality Date  . Tonsillectomy    . Knee arthroplasty     Family History  Problem Relation Age of Onset  . Diabetes Mother   . Diabetes Father   . Cancer Maternal Aunt   . Heart disease Maternal Grandfather    History  Substance Use Topics  . Smoking status: Never Smoker   . Smokeless tobacco: Not on file  . Alcohol Use: No    Review of Systems  Constitutional: Negative for fever and chills.  Respiratory: Negative for shortness of breath.   Cardiovascular: Positive for chest pain.  Gastrointestinal: Negative for nausea, vomiting, diarrhea and constipation.  Genitourinary: Negative for dysuria.  Skin: Positive for wound.  All other systems  reviewed and are negative.     Allergies  Review of patient's allergies indicates no known allergies.  Home Medications   Prior to Admission medications   Medication Sig Start Date End Date Taking? Authorizing Provider  acetaminophen (TYLENOL) 500 MG tablet Take 4,000 mg by mouth every 6 (six) hours as needed for headache.    Historical Provider, MD  albuterol (PROVENTIL HFA;VENTOLIN HFA) 108 (90 BASE) MCG/ACT inhaler Inhale 2 puffs into the lungs every 6 (six) hours as needed for wheezing or shortness of breath. 01/30/15   Tresa Garter, MD  glucose blood test strip Use as instructed 08/15/14   Lance Bosch, NP  glucose monitoring kit (FREESTYLE) monitoring kit 1 each by Does not apply route as needed for other. 08/15/14   Lance Bosch, NP  ibuprofen (ADVIL,MOTRIN) 200 MG tablet Take 200 mg by mouth every 6 (six) hours as needed.    Historical Provider, MD  insulin detemir (LEVEMIR) 100 UNIT/ML injection Inject 0.1 mLs (10 Units total) into the skin at bedtime. 11/23/14   Lance Bosch, NP  INSULIN SYRINGE .5CC/28G 28G X 1/2" 0.5 ML MISC Use as directed 08/15/14   Lance Bosch, NP  Lancets (FREESTYLE) lancets Use as instructed 08/15/14   Lance Bosch, NP  lisinopril (PRINIVIL,ZESTRIL) 5 MG tablet Take 1 tablet (5 mg total) by mouth daily. 11/23/14   Lance Bosch, NP  metFORMIN (GLUCOPHAGE) 1000 MG tablet Take 1  tablet (1,000 mg total) by mouth 2 (two) times daily with a meal. 11/23/14   Lance Bosch, NP   BP 153/71 mmHg  Pulse 74  Temp(Src) 97.7 F (36.5 C) (Oral)  Resp 18  SpO2 98% Physical Exam  Constitutional: He is oriented to person, place, and time. He appears well-developed and well-nourished.  HENT:  Head: Normocephalic and atraumatic.  Eyes: Conjunctivae and EOM are normal. Pupils are equal, round, and reactive to light. Right eye exhibits no discharge. Left eye exhibits no discharge. No scleral icterus.  Neck: Normal range of motion. Neck supple. No JVD present.   Cardiovascular: Normal rate, regular rhythm and normal heart sounds.  Exam reveals no gallop and no friction rub.   No murmur heard. Pulmonary/Chest: Effort normal and breath sounds normal. No respiratory distress. He has no wheezes. He has no rales. He exhibits no tenderness.  Anterior chest wall ttp CTAB  Abdominal: Soft. He exhibits no distension and no mass. There is no tenderness. There is no rebound and no guarding.  No focal abdominal tenderness, no RLQ tenderness or pain at McBurney's point, no RUQ tenderness or Murphy's sign, no left-sided abdominal tenderness, no fluid wave, or signs of peritonitis   Musculoskeletal: Normal range of motion. He exhibits no edema or tenderness.  Neurological: He is alert and oriented to person, place, and time.  Skin: Skin is warm and dry.  4x4 cm ulcer to plantar surface of left foot, no discharge, drainage, erythema, or sign of infection  Psychiatric: He has a normal mood and affect. His behavior is normal. Judgment and thought content normal.  Nursing note and vitals reviewed.   ED Course  Procedures (including critical care time) Results for orders placed or performed during the hospital encounter of 06/13/15  CBG monitoring, ED  Result Value Ref Range   Glucose-Capillary 240 (H) 65 - 99 mg/dL  I-stat chem 8, ed  Result Value Ref Range   Sodium 139 135 - 145 mmol/L   Potassium 3.8 3.5 - 5.1 mmol/L   Chloride 103 101 - 111 mmol/L   BUN 13 6 - 20 mg/dL   Creatinine, Ser 0.80 0.61 - 1.24 mg/dL   Glucose, Bld 217 (H) 65 - 99 mg/dL   Calcium, Ion 1.18 1.12 - 1.23 mmol/L   TCO2 22 0 - 100 mmol/L   Hemoglobin 15.0 13.0 - 17.0 g/dL   HCT 44.0 39.0 - 52.0 %   Dg Chest 2 View  06/13/2015   CLINICAL DATA:  Chest pain beginning 06/12/2015.  EXAM: CHEST  2 VIEW  COMPARISON:  PA and lateral chest 06/14/2011.  FINDINGS: The lungs are clear. Heart size is normal. There is no pneumothorax or pleural effusion. No focal bony abnormality is identified.   IMPRESSION: Negative chest.   Electronically Signed   By: Inge Rise M.D.   On: 06/13/2015 15:10     Imaging Review No results found.   EKG Interpretation   Date/Time:  Thursday June 13 2015 13:52:09 EDT Ventricular Rate:  75 PR Interval:  146 QRS Duration: 94 QT Interval:  382 QTC Calculation: 426 R Axis:   65 Text Interpretation:  Normal sinus rhythm Normal ECG No significant change  since last tracing Confirmed by DOCHERTY  MD, MEGAN ([id number]) on 06/13/2015  2:02:26 PM      MDM   Final diagnoses:  Chest pain  Foot ulcer, left, limited to breakdown of skin    Chest pain likely secondary to exercise/delayed onset muscle soreness.  No  SOB.  Low risk for ACS.  EKG is reassuring.  VSS.  PERC negative.  CXR negative for ptx.  Foot ulcer secondary to playing soccer with shoes off.  No evidence of infection.  Recommend daily foot care and good hygiene with hibaclens soaks and bacitracin.   CBG is 240, will check chem 8.  Anion gap 14.  Patient stable and ready for discharge.   Montine Circle, PA-C 06/13/15 1558  Ernestina Patches, MD 06/14/15 [phone]

## 2015-06-13 NOTE — ED Notes (Signed)
Pt scraped bottom of foot playing soccer barefoot. Painful. Pt is diabetic. He has been disinfecting it with antibiotic ointment and hydrogen peroxide. Pt also c/o chest pain starting yesterday. Sharp and radiates to middle of chest from left side. Spontaneous. Pt reports nothing makes it worse or better.

## 2015-06-14 ENCOUNTER — Ambulatory Visit: Payer: Self-pay | Attending: Internal Medicine | Admitting: Internal Medicine

## 2015-06-14 VITALS — BP 134/77 | HR 79 | Temp 98.1°F | Resp 16 | Wt 233.0 lb

## 2015-06-14 DIAGNOSIS — E1165 Type 2 diabetes mellitus with hyperglycemia: Secondary | ICD-10-CM

## 2015-06-14 DIAGNOSIS — S91302A Unspecified open wound, left foot, initial encounter: Secondary | ICD-10-CM

## 2015-06-14 DIAGNOSIS — IMO0002 Reserved for concepts with insufficient information to code with codable children: Secondary | ICD-10-CM

## 2015-06-14 DIAGNOSIS — Z9114 Patient's other noncompliance with medication regimen: Secondary | ICD-10-CM

## 2015-06-14 LAB — POCT GLYCOSYLATED HEMOGLOBIN (HGB A1C): HEMOGLOBIN A1C: 14.1

## 2015-06-14 LAB — BASIC METABOLIC PANEL
BUN: 9 mg/dL (ref 6–23)
CO2: 24 meq/L (ref 19–32)
Calcium: 8.7 mg/dL (ref 8.4–10.5)
Chloride: 100 mEq/L (ref 96–112)
Creat: 0.78 mg/dL (ref 0.50–1.35)
Glucose, Bld: 321 mg/dL — ABNORMAL HIGH (ref 70–99)
Potassium: 4.5 mEq/L (ref 3.5–5.3)
SODIUM: 140 meq/L (ref 135–145)

## 2015-06-14 LAB — GLUCOSE, POCT (MANUAL RESULT ENTRY): POC GLUCOSE: 298 mg/dL — AB (ref 70–99)

## 2015-06-14 MED ORDER — INSULIN DETEMIR 100 UNIT/ML ~~LOC~~ SOLN: 25.0000 [IU] | Freq: Every day | SUBCUTANEOUS | Status: DC

## 2015-06-14 MED ORDER — METFORMIN HCL ER 500 MG PO TB24: 500.0000 mg | ORAL_TABLET | Freq: Every day | ORAL | Status: DC

## 2015-06-14 MED ORDER — LISINOPRIL 5 MG PO TABS: 5.0000 mg | ORAL_TABLET | Freq: Every day | ORAL | Status: DC

## 2015-06-14 NOTE — Progress Notes (Signed)
Patient ID: Ernest Mccann, male   DOB: 08-29-1991, 24 y.o.   MRN: [id number] SUBJECTIVE: 24 y.o. male for follow up of diabetes. Patient has not been seen here in 7 months for diabetes. He presents with his mother who helps with history. She states that the patient quit taking Metformin shortly after his visit here due to diarrhea. Patient states that he increased his Levemir insulin to 20 units nightly but often forgets to take it.  He also states that he does not check his blood sugar level often because he does not have strips or he forgets. He has not been following a diabetic diet and eats excessive carbs and drinks multiple sugary drinks per day. He is concerned about a wound on his left foot that he suffered 5 days ago while playing soccer barefoot. He reports that he was running on pavement and kicked the ball which scraped off a large chunk of skin. He has not had any redness, warmth, or swelling of the left foot. He went to the ER yesterday and was told to use hibaclens soaks and bacitracin for foot care.    Patient is also concerned about worsening depression. He is constantly feeling down about his health and his legal issues. He reports that he is a registered sex offender and it interferes with his daily life and work life. He would like counseling. Patient does admit to feeling like he would be better off dead but he does not feel suicidal or have a plan.    Current Outpatient Prescriptions  Medication Sig Dispense Refill  . albuterol (PROVENTIL HFA;VENTOLIN HFA) 108 (90 BASE) MCG/ACT inhaler Inhale 2 puffs into the lungs every 6 (six) hours as needed for wheezing or shortness of breath. 3 Inhaler 3  . glucose blood test strip Use as instructed 100 each 12  . glucose monitoring kit (FREESTYLE) monitoring kit 1 each by Does not apply route as needed for other. 1 each 0  . insulin detemir (LEVEMIR) 100 UNIT/ML injection Inject 0.1 mLs (10 Units total) into the skin at bedtime. 10 mL 5  .  INSULIN SYRINGE .5CC/28G 28G X 1/2" 0.5 ML MISC Use as directed 100 each 2  . Lancets (FREESTYLE) lancets Use as instructed 100 each 12  . lisinopril (PRINIVIL,ZESTRIL) 5 MG tablet Take 1 tablet (5 mg total) by mouth daily. 30 tablet 4  . metFORMIN (GLUCOPHAGE) 1000 MG tablet Take 1 tablet (1,000 mg total) by mouth 2 (two) times daily with a meal. 60 tablet 4  . Neomycin-Bacitracin-Polymyxin (TRIPLE ANTIBIOTIC EX) Apply 1 application topically daily as needed (infection).     No current facility-administered medications for this visit.    OBJECTIVE: Appearance: alert, well appearing, and in no distress, oriented to person, place, and time and overweight. BP 134/77 mmHg  Pulse 79  Temp(Src) 98.1 F (36.7 C)  Resp 16  Wt 233 lb (105.688 kg)  SpO2 97%  Exam: heart sounds normal rate, regular rhythm, normal S1, S2, no murmurs, rubs, clicks or gallops, no JVD, chest clear, no carotid bruits, feet: warm, good capillary refill, normal DP and PT pulses and large open ulcer of plantar surface of left foot. No drainage, erythema, or signs of infection.   ASSESSMENT: Diabetes Mellitus: poorly controlled, patient poorly compliant and needs to follow diet more regularly  Open wound of left foot---continue hibaclen's and soaks. If signs of infection present, RTC immediately.  Medication non-compliance--went over serious complications of diabetes. Will f/u closely with patient.  PLAN: See orders for this visit as documented in the electronic medical record. Issues reviewed with him: diabetic diet discussed in detail, written exchange diet given, low cholesterol diet, weight control and daily exercise discussed, home glucose monitoring emphasized, all medications, side effects and compliance discussed carefully, foot care discussed and Podiatry visits discussed, annual eye examinations at Ophthalmology discussed and long term diabetic complications discussed.  Return for 2-3 weeks RN-log review and  3 mo PCP .   Chari Manning, NP 06/18/2015 12:53 PM

## 2015-06-14 NOTE — Progress Notes (Signed)
  Pt is here to discuss Diabetes. He is requesting refill on all medicine.

## 2015-06-18 ENCOUNTER — Encounter: Payer: Self-pay | Admitting: Internal Medicine

## 2015-06-19 ENCOUNTER — Telehealth: Payer: Self-pay | Admitting: Clinical

## 2015-06-19 NOTE — Telephone Encounter (Signed)
F[facility]u w pt; [facility]; [facility]

## 2015-06-21 ENCOUNTER — Telehealth: Payer: Self-pay | Admitting: *Deleted

## 2015-06-21 NOTE — Telephone Encounter (Signed)
Left HIPAA compliant message for patient to return my call. 

## 2015-06-21 NOTE — Telephone Encounter (Signed)
-----   Message from Ambrose FinlandValerie A Keck, NP sent at 06/19/2015  9:32 AM EDT ----- Labs are normal.

## 2015-07-02 ENCOUNTER — Encounter (HOSPITAL_COMMUNITY): Payer: Self-pay

## 2015-07-02 ENCOUNTER — Emergency Department (HOSPITAL_COMMUNITY)
Admission: EM | Admit: 2015-07-02 | Discharge: 2015-07-03 | Disposition: A | Discharge status: Home / Self Care | Payer: Self-pay | Attending: Emergency Medicine | Admitting: Emergency Medicine

## 2015-07-02 DIAGNOSIS — Z79899 Other long term (current) drug therapy: Secondary | ICD-10-CM | POA: Insufficient documentation

## 2015-07-02 DIAGNOSIS — E119 Type 2 diabetes mellitus without complications: Secondary | ICD-10-CM | POA: Insufficient documentation

## 2015-07-02 DIAGNOSIS — F4321 Adjustment disorder with depressed mood: Secondary | ICD-10-CM | POA: Diagnosis present

## 2015-07-02 DIAGNOSIS — Z794 Long term (current) use of insulin: Secondary | ICD-10-CM | POA: Insufficient documentation

## 2015-07-02 HISTORY — DX: Major depressive disorder, single episode, unspecified: F32.9

## 2015-07-02 HISTORY — DX: Depression, unspecified: F32.A

## 2015-07-02 LAB — CBC WITH DIFFERENTIAL/PLATELET
Basophils Absolute: 0 10*3/uL (ref 0.0–0.1)
Basophils Relative: 0 % (ref 0–1)
Eosinophils Absolute: 0.2 10*3/uL (ref 0.0–0.7)
Eosinophils Relative: 4 % (ref 0–5)
HCT: 42.1 % (ref 39.0–52.0)
Hemoglobin: 14.1 g/dL (ref 13.0–17.0)
Lymphocytes Relative: 46 % (ref 12–46)
Lymphs Abs: 2.5 10*3/uL (ref 0.7–4.0)
MCH: 27.4 pg (ref 26.0–34.0)
MCHC: 33.5 g/dL (ref 30.0–36.0)
MCV: 81.9 fL (ref 78.0–100.0)
Monocytes Absolute: 0.4 10*3/uL (ref 0.1–1.0)
Monocytes Relative: 7 % (ref 3–12)
Neutro Abs: 2.4 10*3/uL (ref 1.7–7.7)
Neutrophils Relative %: 43 % (ref 43–77)
Platelets: 320 10*3/uL (ref 150–400)
RBC: 5.14 MIL/uL (ref 4.22–5.81)
RDW: 12.9 % (ref 11.5–15.5)
WBC: 5.5 10*3/uL (ref 4.0–10.5)

## 2015-07-02 LAB — RAPID URINE DRUG SCREEN, HOSP PERFORMED
Amphetamines: NOT DETECTED
Barbiturates: NOT DETECTED
Benzodiazepines: NOT DETECTED
Cocaine: NOT DETECTED
Opiates: NOT DETECTED
Tetrahydrocannabinol: NOT DETECTED

## 2015-07-02 LAB — BASIC METABOLIC PANEL
Anion gap: 6 (ref 5–15)
BUN: 13 mg/dL (ref 6–20)
CO2: 26 mmol/L (ref 22–32)
Calcium: 9 mg/dL (ref 8.9–10.3)
Chloride: 104 mmol/L (ref 101–111)
Creatinine, Ser: 0.84 mg/dL (ref 0.61–1.24)
GFR calc Af Amer: >60 mL/min (ref >60)
GFR calc non Af Amer: >60 mL/min (ref >60)
Glucose, Bld: 419 mg/dL — ABNORMAL HIGH (ref 65–99)
Potassium: 4.1 mmol/L (ref 3.5–5.1)
Sodium: 136 mmol/L (ref 135–145)

## 2015-07-02 LAB — ETHANOL: Alcohol, Ethyl (B): <5 mg/dL (ref <5)

## 2015-07-02 MED ORDER — ALUM & MAG HYDROXIDE-SIMETH 200-200-20 MG/5ML PO SUSP: 30.0000 mL | ORAL | Status: DC | PRN

## 2015-07-02 MED ORDER — LORAZEPAM 0.5 MG PO TABS: 1.0000 mg | ORAL_TABLET | Freq: Three times a day (TID) | ORAL | Status: DC | PRN

## 2015-07-02 MED ORDER — ONDANSETRON HCL 4 MG PO TABS: 4.0000 mg | ORAL_TABLET | Freq: Three times a day (TID) | ORAL | Status: DC | PRN

## 2015-07-02 MED ORDER — ACETAMINOPHEN 325 MG PO TABS: 650.0000 mg | ORAL_TABLET | ORAL | Status: DC | PRN

## 2015-07-02 MED ORDER — IBUPROFEN 200 MG PO TABS: 600.0000 mg | ORAL_TABLET | Freq: Three times a day (TID) | ORAL | Status: DC | PRN

## 2015-07-02 NOTE — ED Notes (Signed)
Patient denies SI, HI and AVH at this time. Patient does reports depression. Patient calm and cooperative at this time. Plan of care discussed with patient. Patient voices no complaints or concerns at this time. Encouragement and support provided and safety maintain. Q 15 min safety checks remain in place.

## 2015-07-02 NOTE — ED Notes (Signed)
Pt presents with c/o depression. Pt reports he has had suicidal thoughts in the past but is not having any of those thoughts at this time and is seeking some help for his depression. Pt denies any HI, calm and cooperative.

## 2015-07-03 ENCOUNTER — Inpatient Hospital Stay (HOSPITAL_COMMUNITY): Admission: EM | Admit: 2015-07-03 | Payer: Self-pay | Source: Intra-hospital | Admitting: Psychiatry

## 2015-07-03 DIAGNOSIS — F4321 Adjustment disorder with depressed mood: Secondary | ICD-10-CM | POA: Diagnosis present

## 2015-07-03 LAB — CBG MONITORING, ED: GLUCOSE-CAPILLARY: 221 mg/dL — AB (ref 65–99)

## 2015-07-03 MED ORDER — METFORMIN HCL ER 500 MG PO TB24
500.0000 mg | ORAL_TABLET | Freq: Every day | ORAL | Status: DC
  Administered 2015-07-03: 500 mg via ORAL
  Filled 2015-07-03 (×2): qty 1

## 2015-07-03 MED ORDER — INSULIN DETEMIR 100 UNIT/ML ~~LOC~~ SOLN
25.0000 [IU] | Freq: Every day | SUBCUTANEOUS | Status: DC
  Administered 2015-07-03: 25 [IU] via SUBCUTANEOUS
  Filled 2015-07-03: qty 0.25

## 2015-07-03 MED ORDER — LISINOPRIL 5 MG PO TABS
5.0000 mg | ORAL_TABLET | Freq: Every day | ORAL | Status: DC
  Administered 2015-07-03: 5 mg via ORAL
  Filled 2015-07-03: qty 1

## 2015-07-03 MED ORDER — ALBUTEROL SULFATE HFA 108 (90 BASE) MCG/ACT IN AERS: 2.0000 | INHALATION_SPRAY | Freq: Four times a day (QID) | RESPIRATORY_TRACT | Status: DC | PRN

## 2015-07-03 MED ORDER — INSULIN ASPART 100 UNIT/ML ~~LOC~~ SOLN
8.0000 [IU] | Freq: Once | SUBCUTANEOUS | Status: AC
  Administered 2015-07-03: 8 [IU] via SUBCUTANEOUS
  Filled 2015-07-03: qty 1

## 2015-07-03 NOTE — ED Provider Notes (Signed)
CSN: [id number]     Arrival date & time 07/02/15  2056 History   First MD Initiated Contact with Patient 07/02/15 2202     Chief Complaint  Patient presents with  . Depression     (Consider location/radiation/quality/duration/timing/severity/associated sxs/prior Treatment) HPI Patient presents to the emergency department with depression and suicidal ideation.  The patient states that he has been very depressed since getting worse over the last few weeks.  Patient denies hallucinations, nausea, vomiting, weakness, dizziness, headache, blurred vision, back pain, neck pain, fever, cough, dysuria, incontinence, or syncope.  Patient states that nothing seems make some condition better or worse.  He states he saw his therapist today who sent him in the emergency department for further evaluation Past Medical History  Diagnosis Date  . Diabetes mellitus without complication   . Depression    Past Surgical History  Procedure Laterality Date  . Tonsillectomy    . Knee arthroplasty     Family History  Problem Relation Age of Onset  . Diabetes Mother   . Diabetes Father   . Cancer Maternal Aunt   . Heart disease Maternal Grandfather    History  Substance Use Topics  . Smoking status: Never Smoker   . Smokeless tobacco: Not on file  . Alcohol Use: No    Review of Systems All other systems negative except as documented in the HPI. All pertinent positives and negatives as reviewed in the HPI.   Allergies  Review of patient's allergies indicates no known allergies.  Home Medications   Prior to Admission medications   Medication Sig Start Date End Date Taking? Authorizing Provider  albuterol (PROVENTIL HFA;VENTOLIN HFA) 108 (90 BASE) MCG/ACT inhaler Inhale 2 puffs into the lungs every 6 (six) hours as needed for wheezing or shortness of breath. 01/30/15  Yes Olugbemiga Annitta NeedsE Jegede, MD  insulin aspart (NOVOLOG) 100 UNIT/ML injection Inject 8 Units into the skin once.   Yes Historical  Provider, MD  insulin detemir (LEVEMIR) 100 UNIT/ML injection Inject 0.25 mLs (25 Units total) into the skin at bedtime. 06/14/15  Yes Ambrose FinlandValerie A Keck, NP  lisinopril (PRINIVIL,ZESTRIL) 5 MG tablet Take 1 tablet (5 mg total) by mouth daily. 06/14/15  Yes Ambrose FinlandValerie A Keck, NP  metFORMIN (GLUCOPHAGE XR) 500 MG 24 hr tablet Take 1 tablet (500 mg total) by mouth daily with breakfast. 06/14/15  Yes Ambrose FinlandValerie A Keck, NP   BP 162/81 mmHg  Pulse 66  Temp(Src) 98.3 F (36.8 C) (Oral)  Resp 16  SpO2 100% Physical Exam  Constitutional: He is oriented to person, place, and time. He appears well-developed and well-nourished. No distress.  HENT:  Head: Normocephalic and atraumatic.  Mouth/Throat: Oropharynx is clear and moist.  Eyes: Pupils are equal, round, and reactive to light.  Neck: Normal range of motion. Neck supple.  Cardiovascular: Normal rate, regular rhythm and normal heart sounds.  Exam reveals no gallop and no friction rub.   No murmur heard. Pulmonary/Chest: Effort normal and breath sounds normal. No respiratory distress.  Neurological: He is alert and oriented to person, place, and time.  Skin: Skin is warm and dry. No rash noted. No erythema.  Psychiatric: He has a normal mood and affect. His behavior is normal.  Nursing note and vitals reviewed.   ED Course  Procedures (including critical care time) Labs Review Labs Reviewed  BASIC METABOLIC PANEL - Abnormal; Notable for the following:    Glucose, Bld 419 (*)    All other components within normal limits  CBC WITH DIFFERENTIAL/PLATELET  URINE RAPID DRUG SCREEN, HOSP PERFORMED  ETHANOL    Patient will need TTS evaluation  Charlestine Night, PA-C 07/03/15 1610  Elwin Mocha, MD 07/03/15 2239

## 2015-07-03 NOTE — [facility] Counselor (Signed)
This Clinical research associatewriter faxed out supporting documentation to the following facilities for [facility]:  [facility], [facility], [facility], [facility], [facility]   [place]oya [place], [place] OBS Counselor
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
## 2015-07-03 NOTE — BH Assessment (Signed)
[facility] Assessment Progress Note  Per Thedore MinsMojeed Akintayo, MD, this pt does not require psychiatric hospitalization at this time.  He is to be [facility] with outpatient referrals.  Discharge instructions include contact information for [facility].  Pt's nurse has been notified.  Doylene Canninghomas Michel Hendon, MA Triage Specialist [phone]

## 2015-07-03 NOTE — Discharge Instructions (Signed)
For your ongoing mental health needs, you are advised to follow up with [facility].  If you do not have an appointment currently, new and returning patients are seen at their walk-in clinic.  Walk-in hours are Monday - Friday from 8:00 am - 3:00 pm.  Walk-in patients are seen on a first come, first served basis.  Try to arrive as early as possible for he best chance of being seen the same day:       [facility]      [address]      [place], [place] [address]      [phone]

## 2015-07-03 NOTE — [facility] Suicide Risk Assessment (Signed)
Suicide Risk Assessment  Discharge Assessment   [facility]   Demographic Factors:  Male and Adolescent or young adult  Total Time spent with patient: 45 minutes  Musculoskeletal: Strength & Muscle Tone: within normal limits Gait & Station: normal Patient leans: N/A  Psychiatric Specialty Exam: Physical Exam  Review of Systems  Constitutional: Negative.   HENT: Negative.   Eyes: Negative.   Respiratory: Negative.   Cardiovascular: Negative.   Gastrointestinal: Negative.   Genitourinary: Negative.   Musculoskeletal: Negative.   Skin: Negative.   Neurological: Negative.   Endo/Heme/Allergies: Negative.   Psychiatric/Behavioral: Positive for depression.    Blood pressure 132/75, pulse 68, temperature 98 F (36.7 C), temperature source Oral, resp. rate 18, SpO2 100 %.There is no weight on file to calculate BMI.  General Appearance: Casual  Eye Contact::  Good  Speech:  Normal Rate  Volume:  Normal  Mood:  Depressed  Affect:  Congruent  Thought Process:  Coherent  Orientation:  Full (Time, Place, and Person)  Thought Content:  WDL  Suicidal Thoughts:  No  Homicidal Thoughts:  No  Memory:  Immediate;   Good Recent;   Good Remote;   Good  Judgement:  Fair  Insight:  Good  Psychomotor Activity:  Normal  Concentration:  Good  Recall:  Good  Fund of Knowledge:Good  Language: Good  Akathisia:  No  Handed:  Right  AIMS (if indicated):     Assets:  Leisure Time Physical Health Resilience Social Support  ADL's:  Intact  Cognition: WNL  Sleep:      Has this patient used any form of tobacco in the last 30 days? (Cigarettes, Smokeless Tobacco, Cigars, and/or Pipes) No  Mental Status Per Nursing Assessment::   On Admission:   suicidal ideations  Current Mental Status by Physician: NA  Loss Factors: NA  Historical Factors: NA  Risk Reduction Factors:   Sense of responsibility to family, Living with another person, especially a  relative, Positive social support and Positive therapeutic relationship  Continued Clinical Symptoms:  Depression, mild  Cognitive Features That Contribute To Risk:  None    Suicide Risk:  Minimal: No identifiable suicidal ideation.  Patients presenting with no risk factors but with morbid ruminations; may be classified as minimal risk based on the severity of the depressive symptoms  Principal Problem: Adjustment disorder with depressed mood Discharge Diagnoses:  Patient Active Problem List   Diagnosis Date Noted  . Adjustment disorder with depressed mood [F43.21] 07/03/2015    Priority: High  . DM (diabetes mellitus) type 2, uncontrolled, with ketoacidosis [E13.10] 08/15/2014  . Routine general medical examination at a health care facility [Z00.00] 06/12/2013      Plan Of Care/Follow-up recommendations:  Activity:  as tolerated Diet:  heart healthy diet  Is patient on multiple antipsychotic therapies at discharge:  No   Has Patient had three or more failed trials of antipsychotic monotherapy by history:  No  Recommended Plan for Multiple Antipsychotic Therapies: NA    Shelbi Vaccaro, PMH-NP 07/03/2015, 12:26 PM

## 2015-07-11 ENCOUNTER — Ambulatory Visit: Payer: Self-pay

## 2015-11-30 ENCOUNTER — Encounter (HOSPITAL_COMMUNITY): Payer: Self-pay | Admitting: Emergency Medicine

## 2015-11-30 ENCOUNTER — Emergency Department (HOSPITAL_COMMUNITY)
Admission: EM | Admit: 2015-11-30 | Discharge: 2015-11-30 | Disposition: A | Discharge status: Home / Self Care | Payer: Self-pay | Attending: Emergency Medicine | Admitting: Emergency Medicine

## 2015-11-30 DIAGNOSIS — Z8659 Personal history of other mental and behavioral disorders: Secondary | ICD-10-CM | POA: Insufficient documentation

## 2015-11-30 DIAGNOSIS — Z7984 Long term (current) use of oral hypoglycemic drugs: Secondary | ICD-10-CM | POA: Insufficient documentation

## 2015-11-30 DIAGNOSIS — S56911A Strain of unspecified muscles, fascia and tendons at forearm level, right arm, initial encounter: Secondary | ICD-10-CM

## 2015-11-30 DIAGNOSIS — E119 Type 2 diabetes mellitus without complications: Secondary | ICD-10-CM | POA: Insufficient documentation

## 2015-11-30 DIAGNOSIS — Z79899 Other long term (current) drug therapy: Secondary | ICD-10-CM | POA: Insufficient documentation

## 2015-11-30 DIAGNOSIS — S199XXA Unspecified injury of neck, initial encounter: Secondary | ICD-10-CM | POA: Insufficient documentation

## 2015-11-30 DIAGNOSIS — Z794 Long term (current) use of insulin: Secondary | ICD-10-CM | POA: Insufficient documentation

## 2015-11-30 DIAGNOSIS — Y92410 Unspecified street and highway as the place of occurrence of the external cause: Secondary | ICD-10-CM | POA: Insufficient documentation

## 2015-11-30 DIAGNOSIS — S0990XA Unspecified injury of head, initial encounter: Secondary | ICD-10-CM | POA: Insufficient documentation

## 2015-11-30 DIAGNOSIS — W2209XA Striking against other stationary object, initial encounter: Secondary | ICD-10-CM | POA: Insufficient documentation

## 2015-11-30 DIAGNOSIS — Y9389 Activity, other specified: Secondary | ICD-10-CM | POA: Insufficient documentation

## 2015-11-30 DIAGNOSIS — Y998 Other external cause status: Secondary | ICD-10-CM | POA: Insufficient documentation

## 2015-11-30 MED ORDER — METHOCARBAMOL 500 MG PO TABS: 500.0000 mg | ORAL_TABLET | Freq: Two times a day (BID) | ORAL | Status: DC

## 2015-11-30 MED ORDER — IBUPROFEN 800 MG PO TABS: 800.0000 mg | ORAL_TABLET | Freq: Three times a day (TID) | ORAL | Status: DC

## 2015-11-30 MED ORDER — IBUPROFEN 400 MG PO TABS
800.0000 mg | ORAL_TABLET | Freq: Once | ORAL | Status: AC
  Administered 2015-11-30: 800 mg via ORAL
  Filled 2015-11-30: qty 2
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
## 2015-11-30 NOTE — ED Notes (Signed)
Signature pad in room not working.  Pt verbalized understanding of all discharge instructions, including medications and follow up.

## 2015-11-30 NOTE — Discharge Instructions (Signed)

## 2015-11-30 NOTE — ED Notes (Signed)
Pt able to ambulate independently 
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
## 2016-01-08 ENCOUNTER — Ambulatory Visit: Payer: Self-pay | Attending: Internal Medicine | Admitting: Internal Medicine

## 2016-01-08 ENCOUNTER — Encounter: Payer: Self-pay | Admitting: Internal Medicine

## 2016-01-08 VITALS — BP 143/88 | HR 101 | Temp 98.0°F | Resp 17 | Ht 72.0 in | Wt 235.0 lb

## 2016-01-08 DIAGNOSIS — Z2821 Immunization not carried out because of patient refusal: Secondary | ICD-10-CM

## 2016-01-08 DIAGNOSIS — IMO0001 Reserved for inherently not codable concepts without codable children: Secondary | ICD-10-CM

## 2016-01-08 DIAGNOSIS — Z9114 Patient's other noncompliance with medication regimen: Secondary | ICD-10-CM | POA: Insufficient documentation

## 2016-01-08 DIAGNOSIS — Z794 Long term (current) use of insulin: Secondary | ICD-10-CM | POA: Insufficient documentation

## 2016-01-08 DIAGNOSIS — E1165 Type 2 diabetes mellitus with hyperglycemia: Secondary | ICD-10-CM | POA: Insufficient documentation

## 2016-01-08 DIAGNOSIS — I10 Essential (primary) hypertension: Secondary | ICD-10-CM | POA: Insufficient documentation

## 2016-01-08 DIAGNOSIS — Z7984 Long term (current) use of oral hypoglycemic drugs: Secondary | ICD-10-CM | POA: Insufficient documentation

## 2016-01-08 DIAGNOSIS — Z79899 Other long term (current) drug therapy: Secondary | ICD-10-CM | POA: Insufficient documentation

## 2016-01-08 LAB — POCT GLYCOSYLATED HEMOGLOBIN (HGB A1C): HEMOGLOBIN A1C: 12.5

## 2016-01-08 LAB — GLUCOSE, POCT (MANUAL RESULT ENTRY): POC Glucose: 191 mg/dl — AB (ref 70–99)

## 2016-01-08 MED ORDER — LISINOPRIL 5 MG PO TABS: 5.0000 mg | ORAL_TABLET | Freq: Every day | ORAL | Status: DC

## 2016-01-08 MED ORDER — METFORMIN HCL ER 500 MG PO TB24: 500.0000 mg | ORAL_TABLET | Freq: Every day | ORAL | Status: DC

## 2016-01-08 MED ORDER — INSULIN DETEMIR 100 UNIT/ML ~~LOC~~ SOLN: 28.0000 [IU] | Freq: Every day | SUBCUTANEOUS | Status: DC

## 2016-01-08 NOTE — Progress Notes (Signed)
Patient here for follow up on his diabetes Patient has refused the flu vaccine

## 2016-01-08 NOTE — Progress Notes (Signed)
Patient ID: Ernest Mccann, male   DOB: 07-19-1991, 25 y.o.   MRN: [id number] SUBJECTIVE: 25 y.o. male for follow up of diabetes. Patient reports that he has been using all different types of insulin over the apst 1 month because he ran out of Levemir. He admits to using 40 units of Humulin R and Victoza 1.2 mg that he got from different family members. He has been having symptoms of decreased appetite as well. He denies blurred vision, nausea, tingling in extremities, polyuria. He states that he is now ready to care for himself and begin counseling for his depression. Current Outpatient Prescriptions  Medication Sig Dispense Refill  . albuterol (PROVENTIL HFA;VENTOLIN HFA) 108 (90 BASE) MCG/ACT inhaler Inhale 2 puffs into the lungs every 6 (six) hours as needed for wheezing or shortness of breath. 3 Inhaler 3  . insulin aspart (NOVOLOG) 100 UNIT/ML injection Inject 8 Units into the skin once.    . insulin detemir (LEVEMIR) 100 UNIT/ML injection Inject 0.25 mLs (25 Units total) into the skin at bedtime. 10 mL 5  . lisinopril (PRINIVIL,ZESTRIL) 5 MG tablet Take 1 tablet (5 mg total) by mouth daily. 30 tablet 4  . metFORMIN (GLUCOPHAGE XR) 500 MG 24 hr tablet Take 1 tablet (500 mg total) by mouth daily with breakfast. 30 tablet 3  . ibuprofen (ADVIL,MOTRIN) 800 MG tablet Take 1 tablet (800 mg total) by mouth 3 (three) times daily. 21 tablet 0  . methocarbamol (ROBAXIN) 500 MG tablet Take 1 tablet (500 mg total) by mouth 2 (two) times daily. 20 tablet 0   No current facility-administered medications for this visit.  Review of System: Other than what is stated in HPI, all other systems are negative.   OBJECTIVE: Appearance: alert, well appearing, and in no distress, oriented to person, place, and time and overweight. BP 143/88 mmHg  Pulse 101  Temp(Src) 98 F (36.7 C)  Resp 17  Ht 6' (1.829 m)  Wt 235 lb (106.595 kg)  BMI 31.86 kg/m2  SpO2 100%  Physical Exam  Constitutional: He is  oriented to person, place, and time.  Cardiovascular: Normal rate, regular rhythm and normal heart sounds.   Pulmonary/Chest: Effort normal and breath sounds normal.  Musculoskeletal: He exhibits no edema.  Neurological: He is alert and oriented to person, place, and time.  Skin: Skin is warm and dry.  Psychiatric: He has a normal mood and affect.   ASSESSMENT: Ernest Mccann was seen today for follow-up.  Diagnoses and all orders for this visit:  Uncontrolled type 2 diabetes mellitus without complication, without long-term current use of insulin (HCC) -     Glucose (CBG) -     HgB A1c -     Microalbumin, urine -     insulin detemir (LEVEMIR) 100 UNIT/ML injection; Inject 0.28 mLs (28 Units total) into the skin at bedtime. -     metFORMIN (GLUCOPHAGE XR) 500 MG 24 hr tablet; Take 1 tablet (500 mg total) by mouth daily with breakfast. Patients diabetes remains uncontrolled as evidence by hemoglobin a1c >8.  Patient has been non-compliant with medication regimen. Stressed the multiple complications associated with uncontrolled diabetes.  Patient will stay on current medication dose and report back to clinic with cbg log in 2 weeks.  Essential hypertension -     lisinopril (PRINIVIL,ZESTRIL) 5 MG tablet; Take 1 tablet (5 mg total) by mouth daily. I believe patient has not been taking lisinopril as he described. I will continue to monitor and stress that he  takes medication to lower pressure and protect kidneys   Refused influenza vaccine Explained that annual influenza is recommended per CDC guidelines and is highly suggested to anyone who has has CHF, COPD, DM or immunocompromised. Benefits of influenza described in detail.  Non compliance w medication regimen Went over long term complications.     Return in about 1 week (around 01/15/2016) for Nurse Visit-log review and 3 mo PCP .  Ambrose Finland, NP 01/08/2016 6:44 PM

## 2016-01-15 ENCOUNTER — Ambulatory Visit: Payer: Self-pay | Admitting: Pharmacist

## 2016-01-15 ENCOUNTER — Ambulatory Visit: Payer: Self-pay

## 2016-01-21 ENCOUNTER — Ambulatory Visit: Payer: Self-pay | Admitting: Pharmacist

## 2019-11-13 ENCOUNTER — Other Ambulatory Visit: Payer: Self-pay

## 2019-11-13 DIAGNOSIS — Z20822 Contact with and (suspected) exposure to covid-19: Secondary | ICD-10-CM

## 2019-11-14 LAB — NOVEL CORONAVIRUS, NAA: SARS-CoV-2, NAA: NOT DETECTED

## 2020-08-22 ENCOUNTER — Ambulatory Visit
Admission: EM | Admit: 2020-08-22 | Discharge: 2020-08-22 | Disposition: A | Discharge status: Home / Self Care | Payer: Self-pay | Attending: Internal Medicine | Admitting: Internal Medicine

## 2020-08-22 ENCOUNTER — Other Ambulatory Visit: Payer: Self-pay

## 2020-08-22 DIAGNOSIS — L600 Ingrowing nail: Secondary | ICD-10-CM

## 2020-08-22 MED ORDER — CEPHALEXIN 500 MG PO CAPS: 1000.0000 mg | ORAL_CAPSULE | Freq: Two times a day (BID) | ORAL | 0 refills | Status: DC

## 2020-08-22 NOTE — ED Triage Notes (Signed)
Patient in today for tx of an ingrown toenail on his Left Great Toe x 3 months.

## 2020-08-22 NOTE — Discharge Instructions (Signed)
Soak your toenail in [place] soaks in 2 days daily for 4-5 days  Come back if you get worse to have partial removal of the the nail.

## 2020-08-23 ENCOUNTER — Encounter: Payer: Self-pay | Admitting: Internal Medicine

## 2020-09-19 ENCOUNTER — Ambulatory Visit: Payer: Self-pay

## 2020-11-15 ENCOUNTER — Ambulatory Visit (INDEPENDENT_AMBULATORY_CARE_PROVIDER_SITE_OTHER): Payer: Self-pay | Admitting: Podiatry

## 2020-11-15 ENCOUNTER — Encounter: Payer: Self-pay | Admitting: Podiatry

## 2020-11-15 ENCOUNTER — Other Ambulatory Visit: Payer: Self-pay

## 2020-11-15 VITALS — Temp 97.5°F

## 2020-11-15 DIAGNOSIS — L600 Ingrowing nail: Secondary | ICD-10-CM

## 2020-11-15 MED ORDER — AMOXICILLIN-POT CLAVULANATE 875-125 MG PO TABS: 1.0000 | ORAL_TABLET | Freq: Two times a day (BID) | ORAL | 0 refills | Status: DC

## 2020-11-15 MED ORDER — MUPIROCIN 2 % EX OINT: 1.0000 "application " | TOPICAL_OINTMENT | Freq: Two times a day (BID) | CUTANEOUS | 1 refills | Status: DC

## 2020-11-15 NOTE — Patient Instructions (Signed)

## 2020-11-16 NOTE — Progress Notes (Signed)
   Subjective: Patient presents today for evaluation of pain to the lateral aspect left great toe. Patient is concerned for possible ingrown nail. Patient presents today for further treatment and evaluation.  Past Medical History:  Diagnosis Date  . Depression   . Diabetes mellitus without complication (HCC)   . Diabetes mellitus without complication (HCC)    Type II    Objective:  General: Well developed, nourished, in no acute distress, alert and oriented x3   Dermatology: Skin is warm, dry and supple bilateral.  Lateral aspect left great toe appears to be erythematous with evidence of an ingrowing nail. Pain on palpation noted to the border of the nail fold. The remaining nails appear unremarkable at this time. There are no open sores, lesions.  Vascular: Dorsalis Pedis artery and Posterior Tibial artery pedal pulses palpable. No lower extremity edema noted.   Neruologic: Grossly intact via light touch bilateral.  Musculoskeletal: Muscular strength within normal limits in all groups bilateral. Normal range of motion noted to all pedal and ankle joints.   Assesement: #1 Paronychia with ingrowing nail lateral aspect left great toe #2 Pain in toe #3 Incurvated nail  Plan of Care:  1. Patient evaluated.  2. Discussed treatment alternatives and plan of care. Explained nail avulsion procedure and post procedure course to patient. 3. Patient opted for permanent partial nail avulsion of the lateral aspect left great toe.  4. Prior to procedure, local anesthesia infiltration utilized using 3 ml of a 50:50 mixture of 2% plain lidocaine and 0.5% plain marcaine in a normal hallux block fashion and a betadine prep performed.  5. Partial permanent nail avulsion with chemical matrixectomy performed using 3x30sec applications of phenol followed by alcohol flush.  6. Light dressing applied. 7.  Prescription for Augmentin 875/125 mg  8.  Prescription for mupirocin 2% ointment applied daily  9.   return to clinic 2 weeks.  *Do not charge.  No insurance.  Felecia Shelling, DPM [facility]  Dr. Felecia Shelling, DPM    [address]                                        [place], [place] [address]                Office [phone]  Fax [phone]

## 2020-11-29 ENCOUNTER — Ambulatory Visit (INDEPENDENT_AMBULATORY_CARE_PROVIDER_SITE_OTHER): Payer: Self-pay | Admitting: Podiatry

## 2020-11-29 ENCOUNTER — Encounter: Payer: Self-pay | Admitting: Podiatry

## 2020-11-29 ENCOUNTER — Other Ambulatory Visit: Payer: Self-pay

## 2020-11-29 DIAGNOSIS — L600 Ingrowing nail: Secondary | ICD-10-CM

## 2020-11-29 MED ORDER — DOXYCYCLINE HYCLATE 100 MG PO TABS: 100.0000 mg | ORAL_TABLET | Freq: Two times a day (BID) | ORAL | 0 refills | Status: DC

## 2020-11-29 NOTE — Progress Notes (Signed)
   Subjective: 29 y.o. male presents today status post permanent nail avulsion procedure of the lateral border left great toe that was performed on 11/15/2020.  Patient states that he continues to have some drainage and redness to the toe.  He has been applying antibiotic cream and he completed his oral Augmentin as prescribed.  He presents for further treatment and evaluation he continues to have some pain and sensitivity to the area as well.  Past Medical History:  Diagnosis Date  . Depression   . Diabetes mellitus without complication (HCC)   . Diabetes mellitus without complication (HCC)    Type II    Objective: Skin is warm, dry and supple. Nail and respective nail fold appears to be healing appropriately. Open wound to the associated nail fold with a granular wound base and moderate amount of fibrotic tissue. Minimal drainage noted. Mild erythema around the periungual region likely due to phenol chemical matricectomy.  Assessment: #1 postop permanent partial nail avulsion lateral border left great toe #2 open wound periungual nail fold of respective digit.   Plan of care: #1 patient was evaluated  #2 debridement of open wound was performed to the periungual border of the respective toe using a currette. Antibiotic ointment and Band-Aid was applied. #3 the patient has completed his oral Augmentin.  Today I am going to prescribe doxycycline 100 mg 2 times daily #20.  I see better results with paronychias with infection with the doxycycline compared to the Augmentin.   #4 continue daily Epsom salt soaks and gentamicin cream  #5 patient is to return to clinic 2 weeks  Felecia Shelling, DPM [facility]  Dr. Felecia Shelling, DPM    [address]                                        [place], [place] [address]                Office [phone]  Fax [phone]

## 2020-12-17 ENCOUNTER — Other Ambulatory Visit: Payer: Self-pay

## 2020-12-17 ENCOUNTER — Ambulatory Visit (INDEPENDENT_AMBULATORY_CARE_PROVIDER_SITE_OTHER): Payer: Self-pay | Admitting: Podiatry

## 2020-12-17 DIAGNOSIS — L600 Ingrowing nail: Secondary | ICD-10-CM

## 2020-12-17 NOTE — Progress Notes (Signed)
     Subjective: 30 y.o. male presents today status post permanent nail avulsion procedure of the lateral border left great toe that was performed on 11/15/2020.  Patient completed his oral doxycycline.  He states that he is doing significantly better.  No new complaints at this time.  Past Medical History:  Diagnosis Date  . Depression   . Diabetes mellitus without complication (HCC)   . Diabetes mellitus without complication (HCC)    Type II    Objective: Skin is warm, dry and supple. Nail and respective nail fold appears to be healing appropriately. Open wound to the associated nail fold with a granular wound base and moderate amount of fibrotic tissue. Minimal drainage noted. Mild erythema around the periungual region likely due to phenol chemical matricectomy.  Assessment: #1 postop permanent partial nail avulsion lateral border left great toe #2 open wound periungual nail fold of respective digit.   Plan of care: #1 patient was evaluated  #2 the toenail avulsion site appears to be healing appropriately.  Light debridement was performed of the periungual border. #3 continue gentamicin cream daily as needed #4 recommend good supportive shoes that do not irritate the nail avulsion site #5 return to clinic as needed  *Has a 33-month-old daughter  Felecia Shelling, DPM [facility]  Dr. Felecia Shelling, DPM    [address] [place], [place] [address]                Office [phone]  Fax [phone]

## 2021-04-12 ENCOUNTER — Emergency Department
Admission: EM | Admit: 2021-04-12 | Discharge: 2021-04-12 | Disposition: A | Discharge status: Home / Self Care | Payer: Self-pay | Attending: Emergency Medicine | Admitting: Emergency Medicine

## 2021-04-12 ENCOUNTER — Emergency Department: Payer: Self-pay

## 2021-04-12 ENCOUNTER — Other Ambulatory Visit: Payer: Self-pay

## 2021-04-12 DIAGNOSIS — E1165 Type 2 diabetes mellitus with hyperglycemia: Secondary | ICD-10-CM | POA: Insufficient documentation

## 2021-04-12 DIAGNOSIS — Z79899 Other long term (current) drug therapy: Secondary | ICD-10-CM | POA: Insufficient documentation

## 2021-04-12 DIAGNOSIS — Z7984 Long term (current) use of oral hypoglycemic drugs: Secondary | ICD-10-CM | POA: Insufficient documentation

## 2021-04-12 DIAGNOSIS — Z96659 Presence of unspecified artificial knee joint: Secondary | ICD-10-CM | POA: Insufficient documentation

## 2021-04-12 DIAGNOSIS — R42 Dizziness and giddiness: Secondary | ICD-10-CM | POA: Insufficient documentation

## 2021-04-12 DIAGNOSIS — R739 Hyperglycemia, unspecified: Secondary | ICD-10-CM

## 2021-04-12 DIAGNOSIS — Z20822 Contact with and (suspected) exposure to covid-19: Secondary | ICD-10-CM | POA: Insufficient documentation

## 2021-04-12 DIAGNOSIS — R079 Chest pain, unspecified: Secondary | ICD-10-CM

## 2021-04-12 DIAGNOSIS — R0789 Other chest pain: Secondary | ICD-10-CM | POA: Insufficient documentation

## 2021-04-12 DIAGNOSIS — I10 Essential (primary) hypertension: Secondary | ICD-10-CM | POA: Insufficient documentation

## 2021-04-12 LAB — BASIC METABOLIC PANEL
Anion gap: 9 (ref 5–15)
BUN: 14 mg/dL (ref 6–20)
CO2: 25 mmol/L (ref 22–32)
Calcium: 8.7 mg/dL — ABNORMAL LOW (ref 8.9–10.3)
Chloride: 100 mmol/L (ref 98–111)
Creatinine, Ser: 0.7 mg/dL (ref 0.61–1.24)
GFR, Estimated: >60 mL/min (ref >60)
Glucose, Bld: 478 mg/dL — ABNORMAL HIGH (ref 70–99)
Potassium: 4 mmol/L (ref 3.5–5.1)
Sodium: 134 mmol/L — ABNORMAL LOW (ref 135–145)

## 2021-04-12 LAB — CBC
HCT: 38.3 % — ABNORMAL LOW (ref 39.0–52.0)
Hemoglobin: 13.4 g/dL (ref 13.0–17.0)
MCH: 27.6 pg (ref 26.0–34.0)
MCHC: 35 g/dL (ref 30.0–36.0)
MCV: 78.8 fL — ABNORMAL LOW (ref 80.0–100.0)
Platelets: 294 10*3/uL (ref 150–400)
RBC: 4.86 MIL/uL (ref 4.22–5.81)
RDW: 12.7 % (ref 11.5–15.5)
WBC: 4.1 10*3/uL (ref 4.0–10.5)
nRBC: 0 % (ref 0.0–0.2)

## 2021-04-12 LAB — RESP PANEL BY RT-PCR (FLU A&B, COVID) ARPGX2
Influenza A by PCR: NEGATIVE
Influenza B by PCR: NEGATIVE
SARS Coronavirus 2 by RT PCR: NEGATIVE

## 2021-04-12 LAB — TROPONIN I (HIGH SENSITIVITY)
Troponin I (High Sensitivity): <2 ng/L (ref <18)
Troponin I (High Sensitivity): 3 ng/L (ref <18)

## 2021-04-12 LAB — D-DIMER, QUANTITATIVE: D-Dimer, Quant: 1.04 ug/mL-FEU — ABNORMAL HIGH (ref 0.00–0.50)

## 2021-04-12 MED ORDER — GLIPIZIDE 5 MG PO TABS: 2.5000 mg | ORAL_TABLET | Freq: Two times a day (BID) | ORAL | 1 refills | Status: DC

## 2021-04-12 MED ORDER — BLOOD GLUCOSE MONITOR KIT: PACK | 0 refills | Status: DC

## 2021-04-12 MED ORDER — LACTATED RINGERS IV BOLUS
1000.0000 mL | Freq: Once | INTRAVENOUS | Status: AC
  Administered 2021-04-12: 1000 mL via INTRAVENOUS

## 2021-04-12 MED ORDER — IOHEXOL 350 MG/ML SOLN
75.0000 mL | Freq: Once | INTRAVENOUS | Status: AC | PRN
  Administered 2021-04-12: 75 mL via INTRAVENOUS

## 2021-04-12 NOTE — ED Triage Notes (Signed)
Patient c/o central chest pain that started two days ago. C/o lightheadedness. HX type 2 diabetes.

## 2021-04-12 NOTE — ED Provider Notes (Signed)
[facility] Emergency Department Provider Note   ____________________________________________   Event Date/Time   First MD Initiated Contact with Patient 04/12/21 1733     (approximate)  I have reviewed the triage vital signs and the nursing notes.   HISTORY  Chief Complaint Chest Pain    HPI Ernest Mccann is a 30 y.o. male with past medical history of hypertension and diabetes who presents to the ED complaining of chest pain.  Patient reports that he was driving in his car earlier today when he suddenly started to feel lightheaded with pressure in his chest and difficulty catching his breath.  He continues to have pressure in the center of his chest along with some lightheadedness and dizziness, although his breathing has improved.  He reports vomiting and diarrhea a couple of days ago which was associated with subjective fevers.  The vomiting and diarrhea have improved and he denies any cough.  He is fully vaccinated against COVID-19 but his wife was sick with similar vomiting and diarrhea.  He denies any abdominal pain, dysuria, or flank pain.        Past Medical History:  Diagnosis Date  . Depression   . Diabetes mellitus without complication ([place])   . Diabetes mellitus without complication ([place])    Type II    Patient Active Problem List   Diagnosis Date Noted  . HTN (hypertension) 01/08/2016  . Adjustment disorder with depressed mood 07/03/2015  . DM (diabetes mellitus) type 2, uncontrolled, with ketoacidosis ([place]) 08/15/2014  . Routine general medical examination at a health care facility 06/12/2013    Past Surgical History:  Procedure Laterality Date  . KNEE ARTHROPLASTY    . TONSILLECTOMY      Prior to Admission medications   Medication Sig Start Date End Date Taking? Authorizing Provider  blood glucose meter kit and supplies KIT Dispense based on patient and insurance preference. Use up to four times daily as directed. 04/12/21   Yes Blake Divine, MD  glipiZIDE (GLUCOTROL) 5 MG tablet Take 0.5 tablets (2.5 mg total) by mouth 2 (two) times daily before a meal. 04/12/21 08/10/21 Yes Blake Divine, MD  albuterol (PROVENTIL HFA;VENTOLIN HFA) 108 (90 BASE) MCG/ACT inhaler Inhale 2 puffs into the lungs every 6 (six) hours as needed for wheezing or shortness of breath. 01/30/15   Tresa Garter, MD  amoxicillin-clavulanate (AUGMENTIN) 875-125 MG tablet Take 1 tablet by mouth 2 (two) times daily. 11/15/20   Edrick Kins, DPM  doxycycline (VIBRA-TABS) 100 MG tablet Take 1 tablet (100 mg total) by mouth 2 (two) times daily. 11/29/20   Edrick Kins, DPM  lisinopril (PRINIVIL,ZESTRIL) 5 MG tablet Take 1 tablet (5 mg total) by mouth daily. 01/08/16   Lance Bosch, NP  metFORMIN (GLUCOPHAGE XR) 500 MG 24 hr tablet Take 1 tablet (500 mg total) by mouth daily with breakfast. 01/08/16   Lance Bosch, NP  mupirocin ointment (BACTROBAN) 2 % Apply 1 application topically 2 (two) times daily. 11/15/20   Edrick Kins, DPM    Allergies Patient has no known allergies.  Family History  Problem Relation Age of Onset  . Diabetes Mother   . Diabetes Father   . Heart disease Father   . Renal Disease Father   . Cancer Maternal Aunt   . Heart disease Maternal Grandfather     Social History Social History   Tobacco Use  . Smoking status: Never Smoker  . Smokeless tobacco: Never Used  Substance  Use Topics  . Alcohol use: Yes    Comment: socially  . Drug use: Yes    Comment: THC    Review of Systems  Constitutional: No fever/chills Eyes: No visual changes. ENT: No sore throat. Cardiovascular: Positive for chest pain and lightheadedness. Respiratory: Positive for shortness of breath. Gastrointestinal: No abdominal pain.  Positive for nausea, vomiting, and diarrhea.  No constipation. Genitourinary: Negative for dysuria. Musculoskeletal: Negative for back pain. Skin: Negative for rash. Neurological: Negative for  headaches, focal weakness or numbness.  ____________________________________________   PHYSICAL EXAM:  VITAL SIGNS: ED Triage Vitals  Enc Vitals Group     BP 04/12/21 1525 138/87     Pulse Rate 04/12/21 1525 84     Resp 04/12/21 1525 16     Temp 04/12/21 1525 98.2 F (36.8 C)     Temp Source 04/12/21 1525 Oral     SpO2 04/12/21 1525 98 %     Weight 04/12/21 1527 230 lb (104.3 kg)     Height 04/12/21 1527 6' (1.829 m)     Head Circumference --      Peak Flow --      Pain Score 04/12/21 1527 8     Pain Loc --      Pain Edu? --      Excl. in [place]? --     Constitutional: Alert and oriented. Eyes: Conjunctivae are normal. Head: Atraumatic. Nose: No congestion/rhinnorhea. Mouth/Throat: Mucous membranes are moist. Neck: Normal ROM Cardiovascular: Normal rate, regular rhythm. Grossly normal heart sounds. Respiratory: Normal respiratory effort.  No retractions. Lungs CTAB. Gastrointestinal: Soft and nontender. No distention. Genitourinary: deferred Musculoskeletal: No lower extremity tenderness nor edema. Neurologic:  Normal speech and language. No gross focal neurologic deficits are appreciated. Skin:  Skin is warm, dry and intact. No rash noted. Psychiatric: Mood and affect are normal. Speech and behavior are normal.  ____________________________________________   LABS (all labs ordered are listed, but only abnormal results are displayed)  Labs Reviewed  BASIC METABOLIC PANEL - Abnormal; Notable for the following components:      Result Value   Sodium 134 (*)    Glucose, Bld 478 (*)    Calcium 8.7 (*)    All other components within normal limits  CBC - Abnormal; Notable for the following components:   HCT 38.3 (*)    MCV 78.8 (*)    All other components within normal limits  D-DIMER, QUANTITATIVE - Abnormal; Notable for the following components:   D-Dimer, Quant 1.04 (*)    All other components within normal limits  RESP PANEL BY RT-PCR (FLU A&B, COVID) ARPGX2   TROPONIN I (HIGH SENSITIVITY)  TROPONIN I (HIGH SENSITIVITY)   ____________________________________________  EKG  ED ECG REPORT I, Blake Divine, the attending physician, personally viewed and interpreted this ECG.   Date: 04/12/2021  EKG Time: 15:24  Rate: 89  Rhythm: normal EKG, normal sinus rhythm, unchanged from previous tracings  Axis: Normal  Intervals:none  ST&T Change: None   PROCEDURES  Procedure(s) performed (including Critical Care):  Procedures   ____________________________________________   INITIAL IMPRESSION / ASSESSMENT AND PLAN / ED COURSE       30 year old male with past medical history of hypertension and diabetes who presents to the ED with onset lightheadedness, chest pain, and shortness of breath earlier today preceded by vomiting and diarrhea couple of days ago.  He is not in any respiratory distress and is maintaining O2 sats on room air, but continues to report pressure in  his chest.  EKG shows no evidence of arrhythmia or ischemia, initial troponin is negative and I have low suspicion for ACS.  We will further assess for PE with D-dimer as he is low risk by [place].  Chest x-ray reviewed by me and shows no infiltrate, edema, or effusion.  With his recent vomiting and diarrhea, would also consider COVID-19 or influenza.  COVID and flu testing is negative.  D-dimer elevated but CTA chest is negative for PE or other acute process.  Repeat troponin is also negative and I have low suspicion for cardiac process.  Patient is hyperglycemic, likely contributing to some dehydration and he feels better after IV fluid bolus.  He states that he has been off medication for his diabetes for some time, does not currently have insurance or a PCP.  We will prescribe glipizide as he states he has been unable to tolerate metformin in the past, he was also prescribed testing strips, glucometer, and lancets.  [facility] and counseled  to return to the ED for new worsening symptoms, patient agrees with plan.      ____________________________________________   FINAL CLINICAL IMPRESSION(S) / ED DIAGNOSES  Final diagnoses:  Nonspecific chest pain  Lightheadedness  Hyperglycemia     ED Discharge Orders         Ordered    glipiZIDE (GLUCOTROL) 5 MG tablet  2 times daily before meals        04/12/21 1952    blood glucose meter kit and supplies KIT        04/12/21 1952           Note:  This document was prepared using Dragon voice recognition software and may include unintentional dictation errors.   Blake Divine, MD 04/12/21 [phone]

## 2021-04-17 ENCOUNTER — Telehealth: Payer: Self-pay

## 2021-04-17 NOTE — Telephone Encounter (Signed)
After receiving an ER referral, called pt and LVM.   MD 04/17/21 @ 2:45 pm

## 2021-04-17 NOTE — Telephone Encounter (Signed)
After I left a message, pt called and discussed him and his wife becoming patients here. I mailed them both applications.   MD 04/17/21 @ 2:58 pm

## 2021-09-04 ENCOUNTER — Other Ambulatory Visit: Payer: Self-pay

## 2021-09-04 ENCOUNTER — Ambulatory Visit (HOSPITAL_COMMUNITY)
Admission: EM | Admit: 2021-09-04 | Discharge: 2021-09-04 | Disposition: A | Discharge status: Home / Self Care | Payer: No Payment, Other | Attending: Nurse Practitioner | Admitting: Nurse Practitioner

## 2021-09-04 DIAGNOSIS — F333 Major depressive disorder, recurrent, severe with psychotic symptoms: Secondary | ICD-10-CM | POA: Insufficient documentation

## 2021-09-04 DIAGNOSIS — F411 Generalized anxiety disorder: Secondary | ICD-10-CM | POA: Insufficient documentation

## 2021-09-04 DIAGNOSIS — Z9151 Personal history of suicidal behavior: Secondary | ICD-10-CM | POA: Diagnosis not present

## 2021-09-04 DIAGNOSIS — F4310 Post-traumatic stress disorder, unspecified: Secondary | ICD-10-CM | POA: Insufficient documentation

## 2021-09-04 MED ORDER — MAGNESIUM HYDROXIDE 400 MG/5ML PO SUSP: 30.0000 mL | Freq: Every day | ORAL | Status: DC | PRN

## 2021-09-04 MED ORDER — ACETAMINOPHEN 325 MG PO TABS: 650.0000 mg | ORAL_TABLET | Freq: Four times a day (QID) | ORAL | Status: DC | PRN

## 2021-09-04 MED ORDER — TRAZODONE HCL 50 MG PO TABS
50.0000 mg | ORAL_TABLET | Freq: Every evening | ORAL | Status: DC | PRN
Start: 2021-09-04 — End: 2021-09-04

## 2021-09-04 MED ORDER — ALUM & MAG HYDROXIDE-SIMETH 200-200-20 MG/5ML PO SUSP: 30.0000 mL | ORAL | Status: DC | PRN

## 2021-09-04 MED ORDER — HYDROXYZINE HCL 25 MG PO TABS: 25.0000 mg | ORAL_TABLET | Freq: Three times a day (TID) | ORAL | Status: DC | PRN

## 2021-09-04 MED ORDER — OLANZAPINE 5 MG PO TABS: 5.0000 mg | ORAL_TABLET | Freq: Every day | ORAL | Status: DC
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
## 2021-09-04 NOTE — BH Assessment (Signed)
[place], Routine, MR #[id number]; 30 years old presents this date with his sister ,Ernest Mccann, [phone] and [facility], Wynetta Emery, BA/OP.  Pt denies SI, and AVH.  Pt reports that he has thoughts about harming others, with no plan.  Pt admits to prior MH diagnosis; also currently not taking prescribed medication for symptom management.  MSE signed by pt.
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
## 2021-09-04 NOTE — ED Notes (Signed)
Pt verbalized understanding of discharge inbstructions

## 2021-09-04 NOTE — Discharge Instructions (Signed)
Therapy Walk-in Hours  Monday-Wednesday: 8 AM until slots are full  Friday: 1 PM to 5 PM  For Monday to Wednesday, it is recommended that patients arrive between 7:30 AM and 7:45 AM because patients will be seen in the order of arrival.  For Friday, we ask that patients arrive between 12 PM to 12:30 PM.  Go to the second floor on arrival and check in.  **Availability is limited; therefore, patients may not be seen on the same day.**  Medication management walk-ins:  Monday to Friday: 8 AM to 11 AM.  It is recommended that patients arrive by 7:30 AM to 7:45 AM because patients will be seen in the order of arrival.  Go to the second floor on arrival and check in.  **Availability is limited; therefore, patients may not be seen on the same day.**   Discharge recommendations:  Patient is to take medications as prescribed. Please see information for follow-up appointment with psychiatry and therapy. Please follow up with your primary care provider for all medical related needs.   Therapy: We recommend that patient participate in individual therapy to address mental health concerns.   Atypical antipsychotics: If you are prescribed an atypical antipsychotic, it is recommended that your height, weight, BMI, blood pressure, fasting lipid panel, and fasting blood sugar be monitored by your outpatient providers.  Safety:  The patient should abstain from use of illicit substances/drugs and abuse of any medications. If symptoms worsen or do not continue to improve or if the patient becomes actively suicidal or homicidal then it is recommended that the patient return to the closest hospital emergency department, the [facility], or call 911 for further evaluation and treatment. National Suicide Prevention Lifeline 1-800-SUICIDE or 253-623-9777.  About 988 988 offers 24/7 access to trained crisis counselors who can help people experiencing mental health-related  distress. People can call or text 988 or chat 988lifeline.org for themselves or if they are worried about a loved one who may need crisis support.

## 2021-09-17 ENCOUNTER — Ambulatory Visit: Payer: Self-pay | Attending: Family Medicine | Admitting: Family Medicine

## 2021-09-17 ENCOUNTER — Telehealth: Payer: Self-pay | Admitting: Family Medicine

## 2021-09-17 ENCOUNTER — Other Ambulatory Visit: Payer: Self-pay

## 2021-09-17 ENCOUNTER — Encounter: Payer: Self-pay | Admitting: Family Medicine

## 2021-09-17 VITALS — BP 153/106 | HR 96 | Ht 72.0 in | Wt 220.2 lb

## 2021-09-17 DIAGNOSIS — Z7182 Exercise counseling: Secondary | ICD-10-CM | POA: Insufficient documentation

## 2021-09-17 DIAGNOSIS — F321 Major depressive disorder, single episode, moderate: Secondary | ICD-10-CM

## 2021-09-17 DIAGNOSIS — R196 Halitosis: Secondary | ICD-10-CM

## 2021-09-17 DIAGNOSIS — Z713 Dietary counseling and surveillance: Secondary | ICD-10-CM | POA: Insufficient documentation

## 2021-09-17 DIAGNOSIS — K5909 Other constipation: Secondary | ICD-10-CM

## 2021-09-17 DIAGNOSIS — E1142 Type 2 diabetes mellitus with diabetic polyneuropathy: Secondary | ICD-10-CM | POA: Insufficient documentation

## 2021-09-17 DIAGNOSIS — Z794 Long term (current) use of insulin: Secondary | ICD-10-CM

## 2021-09-17 DIAGNOSIS — E1110 Type 2 diabetes mellitus with ketoacidosis without coma: Secondary | ICD-10-CM

## 2021-09-17 DIAGNOSIS — E1159 Type 2 diabetes mellitus with other circulatory complications: Secondary | ICD-10-CM

## 2021-09-17 DIAGNOSIS — Z7901 Long term (current) use of anticoagulants: Secondary | ICD-10-CM | POA: Insufficient documentation

## 2021-09-17 DIAGNOSIS — Z6829 Body mass index (BMI) 29.0-29.9, adult: Secondary | ICD-10-CM | POA: Insufficient documentation

## 2021-09-17 DIAGNOSIS — Z833 Family history of diabetes mellitus: Secondary | ICD-10-CM | POA: Insufficient documentation

## 2021-09-17 DIAGNOSIS — I152 Hypertension secondary to endocrine disorders: Secondary | ICD-10-CM | POA: Insufficient documentation

## 2021-09-17 DIAGNOSIS — Z79899 Other long term (current) drug therapy: Secondary | ICD-10-CM | POA: Insufficient documentation

## 2021-09-17 DIAGNOSIS — Z1159 Encounter for screening for other viral diseases: Secondary | ICD-10-CM

## 2021-09-17 DIAGNOSIS — Z23 Encounter for immunization: Secondary | ICD-10-CM

## 2021-09-17 DIAGNOSIS — E1169 Type 2 diabetes mellitus with other specified complication: Secondary | ICD-10-CM | POA: Insufficient documentation

## 2021-09-17 LAB — POCT GLYCOSYLATED HEMOGLOBIN (HGB A1C): HbA1c, POC (controlled diabetic range): 13.4 % — AB (ref 0.0–7.0)

## 2021-09-17 LAB — GLUCOSE, POCT (MANUAL RESULT ENTRY): POC Glucose: 380 mg/dl — AB (ref 70–99)

## 2021-09-17 MED ORDER — LISINOPRIL 5 MG PO TABS
5.0000 mg | ORAL_TABLET | Freq: Every day | ORAL | 3 refills | Status: DC
  Filled 2021-09-17: qty 30, 30d supply, fill #0
  Filled 2021-10-22: qty 30, 30d supply, fill #1
  Filled 2021-11-27: qty 30, 30d supply, fill #2
  Filled 2022-01-29: qty 30, 30d supply, fill #0
  Filled 2022-01-29: qty 30, 30d supply, fill #3
  Filled 2022-02-05: qty 30, 30d supply, fill #0

## 2021-09-17 MED ORDER — POLYETHYLENE GLYCOL 3350 17 GM/SCOOP PO POWD
17.0000 g | Freq: Every day | ORAL | 1 refills | Status: DC
  Filled 2021-09-17: qty 238, 14d supply, fill #0

## 2021-09-17 MED ORDER — GABAPENTIN 300 MG PO CAPS
300.0000 mg | ORAL_CAPSULE | Freq: Every day | ORAL | 3 refills | Status: DC
Start: 2021-09-17 — End: 2022-03-02
  Filled 2021-09-17: qty 30, 30d supply, fill #0
  Filled 2021-10-22: qty 30, 30d supply, fill #1
  Filled 2021-11-27: qty 30, 30d supply, fill #2
  Filled 2022-01-29: qty 30, 30d supply, fill #0
  Filled 2022-01-29: qty 30, 30d supply, fill #3
  Filled 2022-02-05: qty 30, 30d supply, fill #0

## 2021-09-17 MED ORDER — INSULIN PEN NEEDLE 31G X 5 MM MISC
1.0000 | Freq: Every day | 5 refills | Status: DC
  Filled 2021-09-17 – 2022-01-29 (×2): qty 100, 90d supply, fill #0
  Filled 2022-01-29: qty 100, 90d supply, fill #1

## 2021-09-17 MED ORDER — LANTUS SOLOSTAR 100 UNIT/ML ~~LOC~~ SOPN
10.0000 [IU] | PEN_INJECTOR | Freq: Every day | SUBCUTANEOUS | 3 refills | Status: DC
  Filled 2021-09-17: qty 3, 30d supply, fill #0
  Filled 2021-10-22: qty 3, 30d supply, fill #1
  Filled 2021-11-27: qty 3, 30d supply, fill #2
  Filled 2022-01-29: qty 3, 30d supply, fill #0
  Filled 2022-01-29: qty 3, 30d supply, fill #3
  Filled 2022-02-05: qty 3, 30d supply, fill #0

## 2021-09-17 MED ORDER — FLUOXETINE HCL 20 MG PO CAPS
20.0000 mg | ORAL_CAPSULE | Freq: Every day | ORAL | 3 refills | Status: DC
  Filled 2021-09-17: qty 30, 30d supply, fill #0
  Filled 2021-10-22: qty 30, 30d supply, fill #1
  Filled 2021-11-27: qty 30, 30d supply, fill #2

## 2021-09-17 NOTE — Telephone Encounter (Signed)
Patient previously seen at [facility], history of suicidal attempt.  He needs to get established with behavioral health.  Thank you

## 2021-09-17 NOTE — Telephone Encounter (Signed)
Dr. Alvis Lemmings,   That patient has been scheduled for 11/30 for therapy and 12/13 medication management with [facility]

## 2021-09-17 NOTE — Progress Notes (Signed)
May have IBS due to episodes of diarrhea then has constipation at times.  Mental health referral.

## 2021-09-17 NOTE — Patient Instructions (Signed)
Halitosis Halitosis is bad breath. Halitosis may be caused by: Foods and beverages. Poor mouth care (oral hygiene). Medical conditions, such as sinus infections, mouth infections, diabetes, and liver or kidney disease. Medicines that dry out your mouth. Smoking. Follow these instructions at home: Oral hygiene        Floss at least once a day. Ask your dentist to show you the best way to floss. Brush your teeth at least two times a day. Use the toothpaste that your dentist recommends. Ask your dentist to show you the best way to brush your teeth. Brush your tongue when you brush your teeth. This may help to improve your breath. Rinse your mouth at least once a day. Use the mouthwash that your dentist recommends. Visit your dentist for a routine cleaning at least twice a year. Eating and drinking Drink enough fluid to keep your urine pale yellow. Eat foods that help to keep your teeth clean, such as carrots and celery. Avoid foods and drinks that can lead to bad breath, such as: Garlic. Onions. Fish. Meats. Coffee. Alcohol. General instructions Do not use any products that contain nicotine or tobacco, such as cigarettes and e-cigarettes. These products can make bad breath worse. If you need help quitting, ask your health care provider. If you wear mouth devices such as dentures or a retainer, make sure you wear and clean your device properly. If you have a dry mouth, try chewing gum or mints that do not contain sugar. Chewing gum or sucking on mints can trigger saliva production. Contact a health care provider if: You develop new symptoms, such as bleeding gums or pain. Your symptoms get worse or do not improve with home care. Summary Halitosis is bad breath. This has many possible causes, such as certain foods and beverages. Make sure you have good oral hygiene. Also keep any oral devices, such as retainers, clean. Drink enough fluid to keep your urine pale yellow. Avoid foods  and drinks that can lead to bad breath, such as garlic and onions. Do not use any products that contain nicotine or tobacco, such as cigarettes and e-cigarettes. This information is not intended to replace advice given to you by your health care provider. Make sure you discuss any questions you have with your healthcare provider. Document Revised: 08/23/2020 Document Reviewed: 08/23/2020 Elsevier Patient Education  2022 Elsevier Inc.  

## 2021-09-17 NOTE — Progress Notes (Signed)
Subjective:  Patient ID: Leda Min, male    DOB: 10-11-1991  Age: 30 y.o. MRN: [id number]  CC: New Patient (Initial Visit)   HPI Arvis A Journey is a 30 y.o. year old male with a history of type 2 diabetes mellitus (A1c 13.4), depression who presents today to establish care.  Interval History: Seen at the [facility] on 09/04/2021 with diagnosis of severe major depression, PTSD, generalized anxiety disorder.  Reviewed notes which indicate previous suicidal attempt by ingesting SSRI medication for which was hospitalized at [facility].  Per notes walk-in hours for [facility] was provided to patient. He was not discharged on any medications. Per patient he presented to the walk-in clinic and clinic was full.  He is currently not taking any medication for his Diabetes. He did experience GI symptoms with Metformin but did well on Glipizide.  He thins he might have IBS as he would have Diarrhea which subsequently changed to yellowish color and he noticed a bad breath. 'With things being a little calm in his life' diarrhea improved and then changed to constipation. Right now predominant symptom is constipation with BM once/2 days Past Medical History:  Diagnosis Date   Depression    Diabetes mellitus without complication ([place])    Diabetes mellitus without complication ([place])    Type II    Past Surgical History:  Procedure Laterality Date   KNEE ARTHROPLASTY     TONSILLECTOMY      Family History  Problem Relation Age of Onset   Diabetes Mother    Diabetes Father    Heart disease Father    Renal Disease Father    Cancer Maternal Aunt    Heart disease Maternal Grandfather     No Known Allergies  Outpatient Medications Prior to Visit  Medication Sig Dispense Refill   albuterol (PROVENTIL HFA;VENTOLIN HFA) 108 (90 BASE) MCG/ACT inhaler Inhale 2 puffs into the lungs every 6 (six) hours as needed for wheezing or shortness of breath. 3 Inhaler 3   glipiZIDE (GLUCOTROL) 5 MG tablet Take  0.5 tablets (2.5 mg total) by mouth 2 (two) times daily before a meal. 60 tablet 1   blood glucose meter kit and supplies KIT Dispense based on patient and insurance preference. Use up to four times daily as directed. 1 each 0   lisinopril (PRINIVIL,ZESTRIL) 5 MG tablet Take 1 tablet (5 mg total) by mouth daily. 30 tablet 4   No facility-administered medications prior to visit.     ROS Review of Systems  Constitutional:  Negative for activity change and appetite change.  HENT:  Negative for sinus pressure and sore throat.   Eyes:  Negative for visual disturbance.  Respiratory:  Negative for cough, chest tightness and shortness of breath.   Cardiovascular:  Negative for chest pain and leg swelling.  Gastrointestinal:  Positive for constipation. Negative for abdominal distention, abdominal pain and diarrhea.  Endocrine: Negative.   Genitourinary:  Negative for dysuria.  Musculoskeletal:  Negative for joint swelling and myalgias.  Skin:  Negative for rash.  Allergic/Immunologic: Negative.   Neurological:  Negative for weakness, light-headedness and numbness.  Psychiatric/Behavioral:  Negative for dysphoric mood and suicidal ideas.    Objective:  BP (!) 153/106 Comment: out of medications.  Pulse 96   Ht 6' (1.829 m)   Wt 220 lb 3.2 oz (99.9 kg)   SpO2 100%   BMI 29.86 kg/m   BP/Weight 09/17/2021 09/04/2021 8/93/8101  Systolic BP 751 025 852  Diastolic BP 778  84 99  Wt. (Lbs) 220.2 - 230  BMI 29.86 - 31.19      Physical Exam Constitutional:      Appearance: He is well-developed.  Cardiovascular:     Rate and Rhythm: Normal rate.     Heart sounds: Normal heart sounds. No murmur heard. Pulmonary:     Effort: Pulmonary effort is normal.     Breath sounds: Normal breath sounds. No wheezing or rales.  Chest:     Chest wall: No tenderness.  Abdominal:     General: Bowel sounds are normal. There is no distension.     Palpations: Abdomen is soft. There is no mass.      Tenderness: There is no abdominal tenderness.  Musculoskeletal:        General: Normal range of motion.     Right lower leg: No edema.     Left lower leg: No edema.  Neurological:     Mental Status: He is alert and oriented to person, place, and time.  Psychiatric:        Mood and Affect: Mood normal.    CMP Latest Ref Rng & Units 04/12/2021 07/02/2015 06/14/2015  Glucose 70 - 99 mg/dL 478(H) 419(H) 321(H)  BUN 6 - 20 mg/dL 14 13 9   Creatinine 0.61 - 1.24 mg/dL 0.70 0.84 0.78  Sodium 135 - 145 mmol/L 134(L) 136 140  Potassium 3.5 - 5.1 mmol/L 4.0 4.1 4.5  Chloride 98 - 111 mmol/L 100 104 100  CO2 22 - 32 mmol/L 25 26 24   Calcium 8.9 - 10.3 mg/dL 8.7(L) 9.0 8.7  Total Protein 6.0 - 8.3 g/dL - - -  Total Bilirubin 0.2 - 1.2 mg/dL - - -  Alkaline Phos 39 - 117 U/L - - -  AST 0 - 37 U/L - - -  ALT 0 - 53 U/L - - -    Lipid Panel     Component Value Date/Time   CHOL 151 08/15/2014 1211   TRIG 207 (H) 08/15/2014 1211   HDL 36 (L) 08/15/2014 1211   CHOLHDL 4.2 08/15/2014 1211   VLDL 41 (H) 08/15/2014 1211   LDLCALC 74 08/15/2014 1211    CBC    Component Value Date/Time   WBC 4.1 04/12/2021 1534   RBC 4.86 04/12/2021 1534   HGB 13.4 04/12/2021 1534   HCT 38.3 (L) 04/12/2021 1534   PLT 294 04/12/2021 1534   MCV 78.8 (L) 04/12/2021 1534   MCH 27.6 04/12/2021 1534   MCHC 35.0 04/12/2021 1534   RDW 12.7 04/12/2021 1534   LYMPHSABS 2.5 07/02/2015 2316   MONOABS 0.4 07/02/2015 2316   EOSABS 0.2 07/02/2015 2316   BASOSABS 0.0 07/02/2015 2316    Lab Results  Component Value Date   HGBA1C 13.4 (A) 09/17/2021   Depression screen PHQ 2/9 09/17/2021 06/14/2015 06/14/2015 06/14/2015 09/10/2014  Decreased Interest 3 3 0 0 0  Down, Depressed, Hopeless 3 3 0 0 0  PHQ - 2 Score 6 6 0 0 0  Altered sleeping 3 3 - - -  Tired, decreased energy 3 3 - - -  Change in appetite 3 3 - - -  Feeling bad or failure about yourself  3 3 - - -  Trouble concentrating 3 3 - - -  Moving slowly or  fidgety/restless 3 2 - - -  Suicidal thoughts 2 3 - - -  PHQ-9 Score 26 26 - - -    GAD 7 : Generalized Anxiety Score 09/17/2021  Nervous,  Anxious, on Edge 3  Control/stop worrying 3  Worry too much - different things 3  Trouble relaxing 3  Restless 3  Easily annoyed or irritable 3  Afraid - awful might happen 3  Total GAD 7 Score 21      Assessment & Plan:  1. Type 2 diabetes mellitus with diabetic polyneuropathy, with long-term current use of insulin (HCC) Uncontrolled with A1c of 13.4; goal is less than 7.0 Initiate Lantus Counseled on Diabetic diet, my plate method, 017 minutes of moderate intensity exercise/week Blood sugar logs with fasting goals of 80-120 mg/dl, random of less than 180 and in the event of sugars less than 60 mg/dl or greater than 400 mg/dl encouraged to notify the clinic. Advised on the need for annual eye exams, annual foot exams, Pneumonia vaccine. - POCT glucose (manual entry) - POCT glycosylated hemoglobin (Hb A1C) - insulin glargine (LANTUS SOLOSTAR) 100 UNIT/ML Solostar Pen; Inject 10 Units into the skin daily.  Dispense: 30 mL; Refill: 3 - Insulin Pen Needle 31G X 5 MM MISC; use 1 pen needle each daily at bedtime.  Dispense: 30 each; Refill: 5 - gabapentin (NEURONTIN) 300 MG capsule; Take 1 capsule (300 mg total) by mouth at bedtime.  Dispense: 30 capsule; Refill: 3 - CMP14+EGFR; Future - Lipid panel; Future - Microalbumin / creatinine urine ratio; Future  2. Hypertension associated with diabetes ([place]) He is currently not on an antihypertensive Review of his chart indicates previously elevated blood pressure Will initiate lisinopril Counseled on blood pressure goal of less than 130/80, low-sodium, DASH diet, medication compliance, 150 minutes of moderate intensity exercise per week. Discussed medication compliance, adverse effects. - lisinopril (ZESTRIL) 5 MG tablet; Take 1 tablet (5 mg total) by mouth daily.  Dispense: 30 tablet; Refill: 3  3.  Other constipation Counseled on increasing fiber intake, increasing water, avoid foods that will precipitate constipation It is unclear if he has IBS at this time but given underlying mental health diagnoses and anxiety it is a possibility - polyethylene glycol powder (GLYCOLAX/MIRALAX) 17 GM/SCOOP powder; Take 17 g by mouth daily.  Dispense: 3350 g; Refill: 1  4. Halitosis - H. pylori breath test  5. Screening for viral disease - HCV RNA quant rflx ultra or genotyp([facility]); Future - HIV Antibody (routine testing w rflx); Future  6. Current moderate episode of major depressive disorder without prior episode (HCC) PHQ-9 of 26, GAD-7 score of 21 Previous hospitalizations for suicidal attempt and ideation He does have stressors at home At the moment denies suicidal attempts or plan Will initiate SSRI and have him scheduled with psych - FLUoxetine (PROZAC) 20 MG capsule; Take 1 capsule (20 mg total) by mouth daily.  Dispense: 30 capsule; Refill: 3  7. Need for immunization against influenza - Flu Vaccine QUAD 12moIM (Fluarix, Fluzone & Alfiuria Quad PF)    No orders of the defined types were placed in this encounter.   Follow-up: No follow-ups on file.       ECharlott Rakes MD, FAAFP. [facility] [place]  09/17/2021, 9:34 AM

## 2021-09-19 NOTE — Addendum Note (Signed)
Addended byMemory Dance on: 09/19/2021 08:50 AM   Modules accepted: Orders

## 2021-09-21 LAB — CMP14+EGFR
ALT: 19 IU/L (ref 0–44)
AST: 10 IU/L (ref 0–40)
Albumin/Globulin Ratio: 2.4 — ABNORMAL HIGH (ref 1.2–2.2)
Albumin: 4.8 g/dL (ref 4.1–5.2)
Alkaline Phosphatase: 121 IU/L (ref 44–121)
BUN/Creatinine Ratio: 24 — ABNORMAL HIGH (ref 9–20)
BUN: 17 mg/dL (ref 6–20)
Bilirubin Total: 0.3 mg/dL (ref 0.0–1.2)
CO2: 22 mmol/L (ref 20–29)
Calcium: 9.9 mg/dL (ref 8.7–10.2)
Chloride: 97 mmol/L (ref 96–106)
Creatinine, Ser: 0.72 mg/dL — ABNORMAL LOW (ref 0.76–1.27)
Globulin, Total: 2 g/dL (ref 1.5–4.5)
Glucose: 370 mg/dL — ABNORMAL HIGH (ref 70–99)
Potassium: 4.8 mmol/L (ref 3.5–5.2)
Sodium: 135 mmol/L (ref 134–144)
Total Protein: 6.8 g/dL (ref 6.0–8.5)
eGFR: 127 mL/min/{1.73_m2} (ref >59)

## 2021-09-21 LAB — HCV RNA QUANT RFLX ULTRA OR GENOTYP: HCV Quant Baseline: NOT DETECTED IU/mL

## 2021-09-21 LAB — LIPID PANEL
Chol/HDL Ratio: 6.8 ratio — ABNORMAL HIGH (ref 0.0–5.0)
Cholesterol, Total: 204 mg/dL — ABNORMAL HIGH (ref 100–199)
HDL: 30 mg/dL — ABNORMAL LOW (ref >39)
LDL Chol Calc (NIH): 98 mg/dL (ref 0–99)
Triglycerides: 453 mg/dL — ABNORMAL HIGH (ref 0–149)
VLDL Cholesterol Cal: 76 mg/dL — ABNORMAL HIGH (ref 5–40)

## 2021-09-21 LAB — HIV ANTIBODY (ROUTINE TESTING W REFLEX): HIV Screen 4th Generation wRfx: NONREACTIVE

## 2021-09-22 LAB — H. PYLORI BREATH TEST: H pylori Breath Test: NEGATIVE

## 2021-10-22 ENCOUNTER — Other Ambulatory Visit: Payer: Self-pay

## 2021-10-22 ENCOUNTER — Telehealth: Payer: Self-pay

## 2021-10-22 NOTE — Telephone Encounter (Signed)
Pt called in requesting update on prescriptions that were requested from Mychart, lisinopril, prozac, lantus, and gabapentin. Attempted to connect pt with someone in the pharmacy d/t no Rx request in our basket for this patient. Unable to reach anyone in the phamarcy but was able to see "fill in progress" for these 4 medications. Pt provided with direct pharmacy # and stated he would call back later to see the cost and if they would be ready today. No other questions noted.

## 2021-10-23 ENCOUNTER — Telehealth (INDEPENDENT_AMBULATORY_CARE_PROVIDER_SITE_OTHER): Payer: Self-pay

## 2021-10-23 ENCOUNTER — Other Ambulatory Visit: Payer: Self-pay

## 2021-10-23 NOTE — Telephone Encounter (Signed)
Copied from CRM [id number]. Topic: General - Call Back - No Documentation >> Oct 20, 2021  1:13 PM Randol Kern wrote: Pt called and is requesting to speak to [facility].   Best contact: [phone]

## 2021-10-27 ENCOUNTER — Telehealth: Payer: Self-pay

## 2021-10-27 NOTE — Telephone Encounter (Signed)
I return Pt call, LVM to call me back 

## 2021-11-12 ENCOUNTER — Other Ambulatory Visit: Payer: Self-pay

## 2021-11-12 ENCOUNTER — Ambulatory Visit (INDEPENDENT_AMBULATORY_CARE_PROVIDER_SITE_OTHER): Payer: No Payment, Other | Admitting: Licensed Clinical Social Worker

## 2021-11-12 ENCOUNTER — Ambulatory Visit: Payer: Medicaid Other | Attending: Family Medicine

## 2021-11-12 DIAGNOSIS — F4310 Post-traumatic stress disorder, unspecified: Secondary | ICD-10-CM | POA: Insufficient documentation

## 2021-11-12 DIAGNOSIS — F411 Generalized anxiety disorder: Secondary | ICD-10-CM | POA: Insufficient documentation

## 2021-11-12 DIAGNOSIS — F333 Major depressive disorder, recurrent, severe with psychotic symptoms: Secondary | ICD-10-CM | POA: Diagnosis not present
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
## 2021-11-12 NOTE — Plan of Care (Signed)
Pt agreeable to plan  ?

## 2021-11-25 ENCOUNTER — Telehealth: Payer: Self-pay | Admitting: Family Medicine

## 2021-11-25 ENCOUNTER — Ambulatory Visit (HOSPITAL_COMMUNITY): Payer: No Payment, Other | Admitting: Physician Assistant

## 2021-11-25 NOTE — Telephone Encounter (Signed)
Pt was sent a letter from financial dept. Inform them, that the application they submitted was incomplete, since they were missing some documentation at the time of the appointment, Pt need to reschedule and resubmit all new papers and application for CAFA and OC, P.S. old documents has been sent back by mail to the Pt and Pt. need to make a new appt. 

## 2021-11-27 ENCOUNTER — Encounter (HOSPITAL_COMMUNITY): Payer: Self-pay

## 2021-11-28 ENCOUNTER — Other Ambulatory Visit: Payer: Self-pay

## 2021-12-02 ENCOUNTER — Other Ambulatory Visit: Payer: Self-pay

## 2021-12-17 ENCOUNTER — Ambulatory Visit: Payer: Medicaid Other | Admitting: Family Medicine

## 2021-12-30 ENCOUNTER — Ambulatory Visit (HOSPITAL_COMMUNITY): Payer: No Payment, Other | Admitting: Licensed Clinical Social Worker

## 2022-01-13 ENCOUNTER — Ambulatory Visit (HOSPITAL_COMMUNITY): Payer: No Payment, Other | Admitting: Licensed Clinical Social Worker

## 2022-01-15 ENCOUNTER — Ambulatory Visit: Payer: Self-pay | Admitting: Physician Assistant

## 2022-01-15 ENCOUNTER — Ambulatory Visit: Payer: Self-pay | Admitting: *Deleted

## 2022-01-15 ENCOUNTER — Other Ambulatory Visit: Payer: Self-pay

## 2022-01-15 DIAGNOSIS — Z794 Long term (current) use of insulin: Secondary | ICD-10-CM

## 2022-01-15 DIAGNOSIS — J029 Acute pharyngitis, unspecified: Secondary | ICD-10-CM

## 2022-01-15 DIAGNOSIS — I10 Essential (primary) hypertension: Secondary | ICD-10-CM

## 2022-01-15 DIAGNOSIS — E1165 Type 2 diabetes mellitus with hyperglycemia: Secondary | ICD-10-CM

## 2022-01-15 MED ORDER — AZITHROMYCIN 250 MG PO TABS
ORAL_TABLET | ORAL | 0 refills | Status: DC
  Filled 2022-01-15: qty 6, 5d supply, fill #0

## 2022-01-15 MED ORDER — BENZONATATE 100 MG PO CAPS
200.0000 mg | ORAL_CAPSULE | Freq: Two times a day (BID) | ORAL | 0 refills | Status: DC | PRN
  Filled 2022-01-15: qty 40, 10d supply, fill #0

## 2022-01-15 NOTE — Progress Notes (Signed)
Established Patient Office Visit  Subjective:  Patient ID: Ernest Mccann, male    DOB: [birth year]  Age: 31 y.o. MRN: [id number]  CC:  Chief Complaint  Patient presents with   URI    Virtual Visit via Telephone Note  I connected with Ernest Mccann on 01/15/22 at  1:20 PM EST by telephone and verified that I am speaking with the correct person using two identifiers.  Location: Patient: Home Provider: [facility]    I discussed the limitations, risks, security and privacy concerns of performing an evaluation and management service by telephone and the availability of in person appointments. I also discussed with the patient that there may be a patient responsible charge related to this service. The patient expressed understanding and agreed to proceed.   History of Present Illness: States that he started having sweating, chills, unmeasured fever, sore throat, headache, right ear pain on Thursday, 1 week ago.  States that he has been eating and drinking, however having a very low appetite.    States that he has been having shortness of breath with coughing, states that he previously was prescribed an albuterol inhaler and has been using that with modest relief.  States that he has also been using Mucinex, Robitussin, Delsym, and NyQuil along with the nasal saline spray.  States that he did take a home COVID test that was negative.    States wife is sick with similar symptoms.  States that he has been using his Lantus, states that he did increase his Lantus on his own to 20 units a night.  States current blood glucose level is 380.  Reports that he ate crackers earlier today.  States that he has been drinking Gatorade's, water, and tea with honey.   Observations/Objective: Medical history and current medications reviewed, no physical exam completed  Past Medical History:  Diagnosis Date   Depression    Diabetes mellitus without complication ([place])     Diabetes mellitus without complication ([place])    Type II    Past Surgical History:  Procedure Laterality Date   KNEE ARTHROPLASTY     TONSILLECTOMY      Family History  Problem Relation Age of Onset   Diabetes Mother    Diabetes Father    Heart disease Father    Renal Disease Father    Cancer Maternal Aunt    Heart disease Maternal Grandfather     Social History   Socioeconomic History   Marital status: Married    Spouse name: Not on file   Number of children: Not on file   Years of education: Not on file   Highest education level: Not on file  Occupational History   Not on file  Tobacco Use   Smoking status: Never   Smokeless tobacco: Never  Substance and Sexual Activity   Alcohol use: Yes    Comment: socially   Drug use: Yes    Comment: THC   Sexual activity: Yes  Other Topics Concern   Not on file  Social History Narrative   ** Merged History Encounter **       Social Determinants of Health   Financial Resource Strain: High Risk   Difficulty of Paying Living Expenses: Very hard  Food Insecurity: Food Insecurity Present   Worried About Charity fundraiser in the Last Year: Often true   Arboriculturist in the Last Year: Often true  Transportation Needs: Unmet Transportation Needs  Lack of Transportation (Medical): No   Lack of Transportation (Non-Medical): Yes  Physical Activity: Sufficiently Active   Days of Exercise per Week: 4 days   Minutes of Exercise per Session: 60 min  Stress: Stress Concern Present   Feeling of Stress : Rather much  Social Connections: Moderately Isolated   Frequency of Communication with Friends and Family: More than three times a week   Frequency of Social Gatherings with Friends and Family: Twice a week   Attends Religious Services: 1 to 4 times per year   Active Member of [employer] or Organizations: No   Attends Archivist Meetings: Never   Marital Status: Separated  Intimate Partner Violence: Not At Risk    Fear of Current or Ex-Partner: No   Emotionally Abused: No   Physically Abused: No   Sexually Abused: No    Outpatient Medications Prior to Visit  Medication Sig Dispense Refill   albuterol (PROVENTIL HFA;VENTOLIN HFA) 108 (90 BASE) MCG/ACT inhaler Inhale 2 puffs into the lungs every 6 (six) hours as needed for wheezing or shortness of breath. 3 Inhaler 3   FLUoxetine (PROZAC) 20 MG capsule Take 1 capsule (20 mg total) by mouth daily. 30 capsule 3   gabapentin (NEURONTIN) 300 MG capsule Take 1 capsule (300 mg total) by mouth at bedtime. 30 capsule 3   insulin glargine (LANTUS SOLOSTAR) 100 UNIT/ML Solostar Pen Inject 10 Units into the skin daily. 30 mL 3   Insulin Pen Needle 31G X 5 MM MISC use 1 pen needle each daily at bedtime. 30 each 5   lisinopril (ZESTRIL) 5 MG tablet Take 1 tablet (5 mg total) by mouth daily. 30 tablet 3   polyethylene glycol powder (GLYCOLAX/MIRALAX) 17 GM/SCOOP powder Take 17 g by mouth daily. 3350 g 1   No facility-administered medications prior to visit.    No Known Allergies  ROS Review of Systems  Constitutional:  Positive for chills and fever.  HENT:  Positive for congestion, ear pain, sinus pressure, sinus pain, sore throat and trouble swallowing.   Eyes: Negative.   Respiratory:  Positive for cough and shortness of breath. Negative for wheezing.   Cardiovascular:  Negative for chest pain.  Gastrointestinal: Negative.   Endocrine: Negative.   Genitourinary: Negative.   Musculoskeletal: Negative.   Skin: Negative.   Allergic/Immunologic: Negative.   Neurological:  Positive for headaches.  Psychiatric/Behavioral: Negative.       Objective:      There were no vitals taken for this visit. Wt Readings from Last 3 Encounters:  09/17/21 220 lb 3.2 oz (99.9 kg)  04/12/21 230 lb (104.3 kg)  08/22/20 230 lb (104.3 kg)     Health Maintenance Due  Topic Date Due   COVID-19 Vaccine (1) Never done   OPHTHALMOLOGY EXAM  Never done   Hepatitis C  Screening  Never done    There are no preventive care reminders to display for this patient.  Lab Results  Component Value Date   TSH 3.426 08/15/2014   Lab Results  Component Value Date   WBC 4.1 04/12/2021   HGB 13.4 04/12/2021   HCT 38.3 (L) 04/12/2021   MCV 78.8 (L) 04/12/2021   PLT 294 04/12/2021   Lab Results  Component Value Date   NA 135 09/19/2021   K 4.8 09/19/2021   CO2 22 09/19/2021   GLUCOSE 370 (H) 09/19/2021   BUN 17 09/19/2021   CREATININE 0.72 (L) 09/19/2021   BILITOT 0.3 09/19/2021   ALKPHOS 121  09/19/2021   AST 10 09/19/2021   ALT 19 09/19/2021   PROT 6.8 09/19/2021   ALBUMIN 4.8 09/19/2021   CALCIUM 9.9 09/19/2021   ANIONGAP 9 04/12/2021   EGFR 127 09/19/2021   Lab Results  Component Value Date   CHOL 204 (H) 09/19/2021   Lab Results  Component Value Date   HDL 30 (L) 09/19/2021   Lab Results  Component Value Date   LDLCALC 98 09/19/2021   Lab Results  Component Value Date   TRIG 453 (H) 09/19/2021   Lab Results  Component Value Date   CHOLHDL 6.8 (H) 09/19/2021   Lab Results  Component Value Date   HGBA1C 13.4 (A) 09/17/2021      Assessment & Plan:   Problem List Items Addressed This Visit       Cardiovascular and Mediastinum   HTN (hypertension)     Endocrine   DM (diabetes mellitus) type 2, uncontrolled, with ketoacidosis ([place])   Other Visit Diagnoses     Acute pharyngitis, unspecified etiology    -  Primary   Relevant Medications   azithromycin (ZITHROMAX) 250 MG tablet   benzonatate (TESSALON) 100 MG capsule       Meds ordered this encounter  Medications   azithromycin (ZITHROMAX) 250 MG tablet    Sig: Take 2 tabs by mouth on day 1, then take 1 tab by mouth once daily    Dispense:  6 tablet    Refill:  0    Order Specific Question:   Supervising Provider    Answer:   Elsie Stain [[id number]]   benzonatate (TESSALON) 100 MG capsule    Sig: Take 2 capsules (200 mg total) by mouth 2 (two) times daily as  needed for cough.    Dispense:  40 capsule    Refill:  0    Order Specific Question:   Supervising Provider    Answer:   Elsie Stain [[id number]]    Assessment and Plan: 1. Acute pharyngitis, unspecified etiology Trial azithromycin, Tessalon Perles.  Patient education given on supportive care.  Patient education given on proper use of over-the-counter cold medications with hypertension.  Red flags given for prompt reevaluation - azithromycin (ZITHROMAX) 250 MG tablet; Take 2 tabs by mouth on day 1, then take 1 tab by mouth once daily  Dispense: 6 tablet; Refill: 0 - benzonatate (TESSALON) 100 MG capsule; Take 2 capsules (200 mg total) by mouth 2 (two) times daily as needed for cough.  Dispense: 40 capsule; Refill: 0  2. Type 2 diabetes mellitus with hyperglycemia, with long-term current use of insulin (H[facility]) Last A1c 4 months ago 13.4, patient encouraged to continue checking blood glucose levels, return to mobile unit next week for further evaluation.  Unfortunately patient did have follow-up with primary care provider at the beginning of January but was unable to attend that appointment.  3. Primary hypertension Patient education given on proper use of over-the-counter cold medications.  Patient understands and agrees   Follow Up Instructions:    I discussed the assessment and treatment plan with the patient. The patient was provided an opportunity to ask questions and all were answered. The patient agreed with the plan and demonstrated an understanding of the instructions.   The patient was advised to call back or seek an in-person evaluation if the symptoms worsen or if the condition fails to improve as anticipated.  I provided 21 minutes of non-face-to-face time during this encounter.   Loraine Grip Mayers, PA-C  Follow-up: Return in about 1 week (around 01/22/2022).    Loraine Grip Mayers, PA-C

## 2022-01-15 NOTE — Telephone Encounter (Signed)
Reason for Disposition  Earache  Answer Assessment - Initial Assessment Questions 1. ONSET: "When did the nasal discharge start?"      Started last THur.   My voice is hoarse.   Fever over the weekend aches and chills and sweating a lot.   No appetite, headache, earache, sore throat and sinus congestion.   Albuterol inhaler not helping cough. 2. AMOUNT: "How much discharge is there?"      Covid test negative   Runny nose 3. COUGH: "Do you have a cough?" If yes, ask: "Describe the color of your sputum" (clear, white, yellow, green)     Yes  dry cough     4. RESPIRATORY DISTRESS: "Describe your breathing."      Last night I was having trouble breathing.    I used my albuterol inhaler which helped. 5. FEVER: "Do you have a fever?" If Yes, ask: "What is your temperature, how was it measured, and when did it start?"     No did over the weekend 6. SEVERITY: "Overall, how bad are you feeling right now?" (e.g., doesn't interfere with normal activities, staying home from school/work, staying in bed)       7. OTHER SYMPTOMS: "Do you have any other symptoms?" (e.g., sore throat, earache, wheezing, vomiting)     Earache in right ear, headache, sore throat for 3 days. 8. PREGNANCY: "Is there any chance you are pregnant?" "When was your last menstrual period?"     N/A  Protocols used: Common Cold-A-AH  Chief Complaint: URI symptoms Symptoms: runny nose, sore throat, coughing, body aches, fever, right earache, headache, shortness of breath last night which was helped with albuterol inhaler. Frequency: For 3 days  Pertinent Negatives: Patient denies chest tightness  Disposition: [] ED /[] Urgent Care (no appt availability in office) / [] Appointment(In office/virtual)/ []  [facility]/ [] Home Care/ [] Refused Recommended Disposition /[x] [facility]/ []  Follow-up with PCP Additional Notes: Location given for the Mobile Unit on [address]   Pt agreeable to going.

## 2022-01-15 NOTE — Progress Notes (Signed)
Patient verified DOB Patient has taken robitussin and NyQuill and Mucinex that has provided minimal relief. Patient reports SOB and has used inhaler. Patient reports a dry cough present. Patient denies N/V/Diarrhea and patient reports chills last Thursday. Patients wife is currently sick and is doing better. Patient was able to tolerate crackers this morning around 9:30am and will attempt left over pizza now for lunch. CBG: 388

## 2022-01-15 NOTE — Patient Instructions (Signed)
You are going to take azithromycin as directed, and use the Tessalon Perles to help with your cough.  I do encourage you to stay very well-hydrated, while avoiding sugary drinks.  I do encourage you to check your blood sugar levels twice a day, keep a written log and have available for all office visits.  Please return to the mobile unit in 1 week for follow-up on your diabetes.  I hope that you feel better soon  Kennieth Rad, PA-C Physician Assistant [place] [email or web address]   Upper Respiratory Infection, Adult An upper respiratory infection (URI) is a common viral infection of the nose, throat, and upper air passages that lead to the lungs. The most common type of URI is the common cold. URIs usually get better on their own, without medical treatment. What are the causes? A URI is caused by a virus. You may catch a virus by: Breathing in droplets from an infected person's cough or sneeze. Touching something that has been exposed to the virus (is contaminated) and then touching your mouth, nose, or eyes. What increases the risk? You are more likely to get a URI if: You are very young or very old. You have close contact with others, such as at work, school, or a health care facility. You smoke. You have long-term (chronic) heart or lung disease. You have a weakened disease-fighting system (immune system). You have nasal allergies or asthma. You are experiencing a lot of stress. You have poor nutrition. What are the signs or symptoms? A URI usually involves some of the following symptoms: Runny or stuffy (congested) nose. Cough. Sneezing. Sore throat. Headache. Fatigue. Fever. Loss of appetite. Pain in your forehead, behind your eyes, and over your cheekbones (sinus pain). Muscle aches. Redness or irritation of the eyes. Pressure in the ears or face. How is this diagnosed? This condition may be diagnosed based  on your medical history and symptoms, and a physical exam. Your health care provider may use a swab to take a mucus sample from your nose (nasal swab). This sample can be tested to determine what virus is causing the illness. How is this treated? URIs usually get better on their own within 7-10 days. Medicines cannot cure URIs, but your health care provider may recommend certain medicines to help relieve symptoms, such as: Over-the-counter cold medicines. Cough suppressants. Coughing is a type of defense against infection that helps to clear the respiratory system, so take these medicines only as recommended by your health care provider. Fever-reducing medicines. Follow these instructions at home: Activity Rest as needed. If you have a fever, stay home from work or school until your fever is gone or until your health care provider says your URI cannot spread to other people (is no longer contagious). Your health care provider may have you wear a face mask to prevent your infection from spreading. Relieving symptoms Gargle with a mixture of salt and water 3-4 times a day or as needed. To make salt water, completely dissolve -1 tsp (3-6 g) of salt in 1 cup (237 mL) of warm water. Use a cool-mist humidifier to add moisture to the air. This can help you breathe more easily. Eating and drinking  Drink enough fluid to keep your urine pale yellow. Eat soups and other clear broths. General instructions  Take over-the-counter and prescription medicines only as told by your health care provider. These include cold medicines, fever reducers, and cough suppressants. Do not use any products that contain nicotine or  tobacco. These products include cigarettes, chewing tobacco, and vaping devices, such as e-cigarettes. If you need help quitting, ask your health care provider. Stay away from secondhand smoke. Stay up to date on all immunizations, including the yearly (annual) flu vaccine. Keep all follow-up  visits. This is important. How to prevent the spread of infection to others URIs can be contagious. To prevent the infection from spreading: Wash your hands with soap and water for at least 20 seconds. If soap and water are not available, use hand sanitizer. Avoid touching your mouth, face, eyes, or nose. Cough or sneeze into a tissue or your sleeve or elbow instead of into your hand or into the air.  Contact a health care provider if: You are getting worse instead of better. You have a fever or chills. Your mucus is brown or red. You have yellow or brown discharge coming from your nose. You have pain in your face, especially when you bend forward. You have swollen neck glands. You have pain while swallowing. You have white areas in the back of your throat. Get help right away if: You have shortness of breath that gets worse. You have severe or persistent: Headache. Ear pain. Sinus pain. Chest pain. You have chronic lung disease along with any of the following: Making high-pitched whistling sounds when you breathe, most often when you breathe out (wheezing). Prolonged cough (more than 14 days). Coughing up blood. A change in your usual mucus. You have a stiff neck. You have changes in your: Vision. Hearing. Thinking. Mood. These symptoms may be an emergency. Get help right away. Call 911. Do not wait to see if the symptoms will go away. Do not drive yourself to the hospital. Summary An upper respiratory infection (URI) is a common infection of the nose, throat, and upper air passages that lead to the lungs. A URI is caused by a virus. URIs usually get better on their own within 7-10 days. Medicines cannot cure URIs, but your health care provider may recommend certain medicines to help relieve symptoms. This information is not intended to replace advice given to you by your health care provider. Make sure you discuss any questions you have with your health care  provider. Document Revised: 07/02/2021 Document Reviewed: 07/02/2021 Elsevier Patient Education  [place].

## 2022-01-15 NOTE — Telephone Encounter (Signed)
[id number] Pt has symptoms, wants to be seen today. Runny nose, congestion, ear is throbbing in pain very sensitive, headache, sore throat, did have a fever for 3 days. Seeking appt today please advise   Has been taking OTC medication and drinking tea. He says his albuterol inhaler has been helping his cough.   Left Vm to call back to discuss symptoms.

## 2022-01-20 ENCOUNTER — Ambulatory Visit (HOSPITAL_COMMUNITY): Payer: Self-pay | Admitting: Licensed Clinical Social Worker

## 2022-01-27 ENCOUNTER — Other Ambulatory Visit: Payer: Self-pay

## 2022-01-27 ENCOUNTER — Encounter (HOSPITAL_COMMUNITY): Payer: Self-pay | Admitting: Physician Assistant

## 2022-01-27 ENCOUNTER — Ambulatory Visit (INDEPENDENT_AMBULATORY_CARE_PROVIDER_SITE_OTHER): Payer: BC Managed Care – PPO | Admitting: Physician Assistant

## 2022-01-27 DIAGNOSIS — F411 Generalized anxiety disorder: Secondary | ICD-10-CM | POA: Diagnosis not present

## 2022-01-27 DIAGNOSIS — F333 Major depressive disorder, recurrent, severe with psychotic symptoms: Secondary | ICD-10-CM | POA: Diagnosis not present

## 2022-01-27 DIAGNOSIS — F4310 Post-traumatic stress disorder, unspecified: Secondary | ICD-10-CM | POA: Diagnosis not present

## 2022-01-27 DIAGNOSIS — G4700 Insomnia, unspecified: Secondary | ICD-10-CM | POA: Insufficient documentation

## 2022-01-27 MED ORDER — BUPROPION HCL ER (XL) 150 MG PO TB24
150.0000 mg | ORAL_TABLET | ORAL | 1 refills | Status: DC
Start: 2022-01-27 — End: 2022-03-17
  Filled 2022-01-27 – 2022-02-05 (×2): qty 30, 30d supply, fill #0

## 2022-01-27 MED ORDER — FLUOXETINE HCL 40 MG PO CAPS
40.0000 mg | ORAL_CAPSULE | Freq: Every day | ORAL | 1 refills | Status: DC
  Filled 2022-01-27 – 2022-02-05 (×2): qty 30, 30d supply, fill #0

## 2022-01-27 MED ORDER — TRAZODONE HCL 50 MG PO TABS
50.0000 mg | ORAL_TABLET | Freq: Every day | ORAL | 1 refills | Status: DC
  Filled 2022-01-27 – 2022-02-05 (×2): qty 30, 30d supply, fill #0
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
## 2022-01-28 ENCOUNTER — Ambulatory Visit (INDEPENDENT_AMBULATORY_CARE_PROVIDER_SITE_OTHER): Payer: BC Managed Care – PPO | Admitting: Licensed Clinical Social Worker

## 2022-01-28 DIAGNOSIS — F411 Generalized anxiety disorder: Secondary | ICD-10-CM

## 2022-01-28 DIAGNOSIS — F4310 Post-traumatic stress disorder, unspecified: Secondary | ICD-10-CM

## 2022-01-28 DIAGNOSIS — F333 Major depressive disorder, recurrent, severe with psychotic symptoms: Secondary | ICD-10-CM | POA: Diagnosis not present

## 2022-01-28 NOTE — Plan of Care (Signed)
Pt has not progressed as pt no showed for first therapy session. This was discussed with pt and pt agreeable to show up at least 1 x monthly.
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
## 2022-01-29 ENCOUNTER — Other Ambulatory Visit: Payer: Self-pay | Admitting: Family Medicine

## 2022-01-29 ENCOUNTER — Other Ambulatory Visit: Payer: Self-pay

## 2022-01-29 DIAGNOSIS — E1142 Type 2 diabetes mellitus with diabetic polyneuropathy: Secondary | ICD-10-CM

## 2022-01-30 ENCOUNTER — Other Ambulatory Visit: Payer: Self-pay

## 2022-01-30 MED ORDER — INSULIN PEN NEEDLE 31G X 5 MM MISC
1.0000 | Freq: Every day | 5 refills | Status: DC
  Filled 2022-01-30 – 2022-02-05 (×2): qty 100, 90d supply, fill #0

## 2022-01-30 NOTE — Telephone Encounter (Signed)
Requested Prescriptions  Pending Prescriptions Disp Refills   Insulin Pen Needle 31G X 5 MM MISC 30 each 5    Sig: use 1 pen needle each daily at bedtime.     Endocrinology: Diabetes - Testing Supplies Passed - 01/29/2022  3:53 PM      Passed - Valid encounter within last 12 months    Recent Outpatient Visits          4 months ago Type 2 diabetes mellitus with diabetic polyneuropathy, with long-term current use of insulin (HCC)   [facility] [place], [place], MD   6 years ago Uncontrolled type 2 diabetes mellitus without complication, without long-term current use of insulin (HCC)   [facility] [place], NP   6 years ago DM (diabetes mellitus), type 2, uncontrolled   [facility] [place], Vikki Ports A, NP   7 years ago Type 2 diabetes mellitus without complication   [facility] [place] A, NP   7 years ago DM (diabetes mellitus) type 2, uncontrolled, with ketoacidosis   [facility] [place], Raenette Rover, NP      Future Appointments            In 1 month Hoy Register, MD [facility]

## 2022-02-03 ENCOUNTER — Other Ambulatory Visit: Payer: Self-pay

## 2022-02-05 ENCOUNTER — Other Ambulatory Visit: Payer: Self-pay

## 2022-03-02 ENCOUNTER — Other Ambulatory Visit: Payer: Self-pay | Admitting: Family Medicine

## 2022-03-02 ENCOUNTER — Other Ambulatory Visit: Payer: Self-pay | Admitting: Pharmacist

## 2022-03-02 ENCOUNTER — Other Ambulatory Visit: Payer: Self-pay

## 2022-03-02 ENCOUNTER — Ambulatory Visit: Payer: Self-pay | Attending: Family Medicine | Admitting: Family Medicine

## 2022-03-02 ENCOUNTER — Encounter: Payer: Self-pay | Admitting: Family Medicine

## 2022-03-02 VITALS — BP 158/101 | HR 88 | Ht 72.0 in | Wt 226.6 lb

## 2022-03-02 DIAGNOSIS — E1142 Type 2 diabetes mellitus with diabetic polyneuropathy: Secondary | ICD-10-CM

## 2022-03-02 DIAGNOSIS — Z794 Long term (current) use of insulin: Secondary | ICD-10-CM

## 2022-03-02 DIAGNOSIS — I152 Hypertension secondary to endocrine disorders: Secondary | ICD-10-CM

## 2022-03-02 DIAGNOSIS — L219 Seborrheic dermatitis, unspecified: Secondary | ICD-10-CM

## 2022-03-02 DIAGNOSIS — E1159 Type 2 diabetes mellitus with other circulatory complications: Secondary | ICD-10-CM

## 2022-03-02 DIAGNOSIS — N529 Male erectile dysfunction, unspecified: Secondary | ICD-10-CM

## 2022-03-02 LAB — POCT GLYCOSYLATED HEMOGLOBIN (HGB A1C): HbA1c, POC (controlled diabetic range): 13.6 % — AB (ref 0.0–7.0)

## 2022-03-02 LAB — GLUCOSE, POCT (MANUAL RESULT ENTRY): POC Glucose: 355 mg/dl — AB (ref 70–99)

## 2022-03-02 MED ORDER — SELENIUM SULFIDE 2.25 % EX SHAM
1.0000 | MEDICATED_SHAMPOO | CUTANEOUS | 1 refills | Status: DC
  Filled 2022-03-02: qty 180, 25d supply, fill #0
  Filled 2022-08-30 – 2022-10-14 (×2): qty 180, 25d supply, fill #1

## 2022-03-02 MED ORDER — LISINOPRIL 10 MG PO TABS
10.0000 mg | ORAL_TABLET | Freq: Every day | ORAL | 6 refills | Status: DC
Start: 2022-03-02 — End: 2023-09-07
  Filled 2022-03-02: qty 30, 30d supply, fill #0

## 2022-03-02 MED ORDER — LANTUS SOLOSTAR 100 UNIT/ML ~~LOC~~ SOPN
20.0000 [IU] | PEN_INJECTOR | Freq: Every day | SUBCUTANEOUS | 3 refills | Status: DC
  Filled 2022-03-02: qty 30, 150d supply, fill #0

## 2022-03-02 MED ORDER — BASAGLAR KWIKPEN 100 UNIT/ML ~~LOC~~ SOPN
20.0000 [IU] | PEN_INJECTOR | Freq: Every day | SUBCUTANEOUS | 3 refills | Status: DC
  Filled 2022-03-02: qty 6, 30d supply, fill #0

## 2022-03-02 MED ORDER — GABAPENTIN 300 MG PO CAPS
300.0000 mg | ORAL_CAPSULE | Freq: Every day | ORAL | 6 refills | Status: DC
  Filled 2022-03-02: qty 30, 30d supply, fill #0

## 2022-03-02 MED ORDER — TRULICITY 0.75 MG/0.5ML ~~LOC~~ SOAJ
0.7500 mg | SUBCUTANEOUS | 1 refills | Status: DC
Start: 2022-03-02 — End: 2022-04-02
  Filled 2022-03-02: qty 2, 28d supply, fill #0

## 2022-03-02 MED ORDER — SILDENAFIL CITRATE 50 MG PO TABS
50.0000 mg | ORAL_TABLET | Freq: Every day | ORAL | 1 refills | Status: DC | PRN
  Filled 2022-03-02: qty 10, 10d supply, fill #0
  Filled 2022-03-16: qty 10, 10d supply, fill #1
  Filled 2022-03-19 (×2): qty 10, 10d supply, fill #0

## 2022-03-02 NOTE — Patient Instructions (Signed)
Dulaglutide Injection ?What is this medication? ?DULAGLUTIDE (DOO la GLOO tide) treats type 2 diabetes. It works by increasing insulin levels in your body, which decreases your blood sugar (glucose). It also reduces the amount of sugar released into your blood and slows down your digestion. It can also be used to lower the risk of heart attack and stroke in people with type 2 diabetes. Changes to diet and exercise are often combined with this medication. ?This medicine may be used for other purposes; ask your health care provider or pharmacist if you have questions. ?COMMON BRAND NAME(S): Trulicity ?What should I tell my care team before I take this medication? ?They need to know if you have any of these conditions: ?Endocrine tumors (MEN 2) or if someone in your family had these tumors ?Eye disease, vision problems ?History of pancreatitis ?Kidney disease ?Liver disease ?Stomach or intestine problems ?Thyroid cancer or if someone in your family had thyroid cancer ?An unusual or allergic reaction to dulaglutide, other medications, foods, dyes, or preservatives ?Pregnant or trying to get pregnant ?Breast-feeding ?How should I use this medication? ?This medication is injected under the skin. You will be taught how to prepare and give it. Take it as directed on the prescription label on the same day of each week. Do NOT prime the pen. Keep taking it unless your care team tells you to stop. ?If you use this medication with insulin, you should inject this medication and the insulin separately. Do not mix them together. Do not give the injections right next to each other. Change (rotate) injection sites with each injection. ?This medication comes with INSTRUCTIONS FOR USE. Ask your pharmacist for directions on how to use this medication. Read the information carefully. Talk to your pharmacist or care team if you have questions. ?It is important that you put your used needles and syringes in a special sharps container. Do  not put them in a trash can. If you do not have a sharps container, call your pharmacist or care team to get one. ?A special MedGuide will be given to you by the pharmacist with each prescription and refill. Be sure to read this information carefully each time. ?Talk to your care team about the use of this medication in children. Special care may be needed. ?Overdosage: If you think you have taken too much of this medicine contact a poison control center or emergency room at once. ?NOTE: This medicine is only for you. Do not share this medicine with others. ?What if I miss a dose? ?If you miss a dose, take it as soon as you can unless it is more than 3 days late. If it is more than 3 days late, skip the missed dose. Take the next dose at the normal time. ?What may interact with this medication? ?Other medications for diabetes ?Many medications may cause changes in blood sugar, these include: ?Alcohol containing beverages ?Antiviral medications for HIV or AIDS ?Aspirin and aspirin-like medications ?Certain medications for blood pressure, heart disease, irregular heart beat ?Chromium ?Diuretics ?Male hormones, such as estrogens or progestins, birth control pills ?Fenofibrate ?Gemfibrozil ?Isoniazid ?Lanreotide ?Male hormones or anabolic steroids ?MAOIs like Carbex, Eldepryl, Marplan, Nardil, and Parnate ?Medications for allergies, asthma, cold, or cough ?Medications for depression, anxiety, or psychotic disturbances ?Medications for weight loss ?Niacin ?Nicotine ?NSAIDs, medications for pain and inflammation, like ibuprofen or naproxen ?Octreotide ?Pasireotide ?Pentamidine ?Phenytoin ?Probenecid ?Quinolone antibiotics such as ciprofloxacin, levofloxacin, ofloxacin ?Some herbal dietary supplements ?Steroid medications such as prednisone or cortisone ?Sulfamethoxazole;   trimethoprim ?Thyroid hormones ?Some medications can hide the warning symptoms of low blood sugar (hypoglycemia). You may need to monitor your blood  sugar more closely if you are taking one of these medications. These include: ?Beta-blockers, often used for high blood pressure or heart problems (examples include atenolol, metoprolol, propranolol) ?Clonidine ?Guanethidine ?Reserpine ?This list may not describe all possible interactions. Give your health care provider a list of all the medicines, herbs, non-prescription drugs, or dietary supplements you use. Also tell them if you smoke, drink alcohol, or use illegal drugs. Some items may interact with your medicine. ?What should I watch for while using this medication? ?Visit your care team for regular checks on your progress. ?Check with your care team if you have severe diarrhea, nausea, and vomiting, or if you sweat a lot. The loss of too much body fluid may make it dangerous for you to take this medication. ?A test called the HbA1C (A1C) will be monitored. This is a simple blood test. It measures your blood sugar control over the last 2 to 3 months. You will receive this test every 3 to 6 months. ?Learn how to check your blood sugar. Learn the symptoms of low and high blood sugar and how to manage them. ?Always carry a quick-source of sugar with you in case you have symptoms of low blood sugar. Examples include hard sugar candy or glucose tablets. Make sure others know that you can choke if you eat or drink when you develop serious symptoms of low blood sugar, such as seizures or unconsciousness. Get medical help at once. ?Tell your care team if you have high blood sugar. You might need to change the dose of your medication. If you are sick or exercising more than usual, you may need to change the dose of your medication. ?Do not skip meals. Ask your care team if you should avoid alcohol. Many nonprescription cough and cold products contain sugar or alcohol. These can affect blood sugar. ?Pens should never be shared. Even if the needle is changed, sharing may result in passing of viruses like hepatitis or  HIV. ?Wear a medical ID bracelet or chain. Carry a card that describes your condition. List the medications and doses you take on the card. ?What side effects may I notice from receiving this medication? ?Side effects that you should report to your care team as soon as possible: ?Allergic reactions--skin rash, itching, hives, swelling of the face, lips, tongue, or throat ?Change in vision ?Dehydration--increased thirst, dry mouth, feeling faint or lightheaded, headache, dark yellow or brown urine ?Kidney injury--decrease in the amount of urine, swelling of the ankles, hands, or feet ?Pancreatitis--severe stomach pain that spreads to your back or gets worse after eating or when touched, fever, nausea, vomiting ?Thyroid cancer--new mass or lump in the neck, pain or trouble swallowing, trouble breathing, hoarseness ?Side effects that usually do not require medical attention (report to your care team if they continue or are bothersome): ?Diarrhea ?Loss of appetite ?Nausea ?Stomach pain ?Vomiting ?This list may not describe all possible side effects. Call your doctor for medical advice about side effects. You may report side effects to FDA at 1-800-FDA-1088. ?Where should I keep my medication? ?Keep out of the reach of children and pets. ?Refrigeration (preferred): Store unopened pens in a refrigerator between 2 and 8 degrees C (36 and 46 degrees F). Keep it in the original carton until you are ready to take it. Do not freeze or use if the medication has been frozen.   Protect from light. Get rid of any unused medication after the expiration date on the label. ?Room Temperature: The pen may be stored at room temperature below 30 degrees C (86 degrees F) for up to a total of 14 days if needed. Protect from light. Avoid exposure to extreme heat. If it is stored at room temperature, throw away any unused medication after 14 days or after it expires, whichever is first. ?To get rid of medications that are no longer needed or  have expired: ?Take the medication to a medication take-back program. Check with your pharmacy or law enforcement to find a location. ?If you cannot return the medication, ask your pharmacist or care team ho

## 2022-03-02 NOTE — Progress Notes (Signed)
? ?Subjective:  ?Patient ID: Ernest Mccann, male    DOB: February 04, 1991  Age: 31 y.o. MRN: [id number] ? ?CC: Diabetes ? ? ?HPI ?Ernest Mccann is a 31 y.o. year old male with a history of type 2 diabetes mellitus (A1c 13.6), anxiety and depression, hypertension here for follow-up of chronic medical conditions. ? ?Interval History: ?His BP is elevated and he states he took his antihypertensive last night but not this morning. ? ?He complains of his scalp peeling , flaking and has been dry. He has been using head and shoulder with no relief and this has been present for several years. He endorses his anxiety also causes him to pick his scalp. ? ?He has noticed erectile dysfunction for the last year and cannot attain or maintain an erection.  Symptoms started shortly after his mom passed last year. ? ?Fasting sugars are in the 300s despite compliance with 10 units of Lantus.  For his neuropathy he is on gabapentin.  He has no hypoglycemic or visual concerns. ?Past Medical History:  ?Diagnosis Date  ? Depression   ? Diabetes mellitus without complication (HCC)   ? Diabetes mellitus without complication (HCC)   ? Type II  ? ? ?Past Surgical History:  ?Procedure Laterality Date  ? KNEE ARTHROPLASTY    ? TONSILLECTOMY    ? ? ?Family History  ?Problem Relation Age of Onset  ? Diabetes Mother   ? Diabetes Father   ? Heart disease Father   ? Renal Disease Father   ? Cancer Maternal Aunt   ? Heart disease Maternal Grandfather   ? ? ?Social History  ? ?Socioeconomic History  ? Marital status: Married  ?  Spouse name: Not on file  ? Number of children: Not on file  ? Years of education: Not on file  ? Highest education level: Not on file  ?Occupational History  ? Not on file  ?Tobacco Use  ? Smoking status: Never  ? Smokeless tobacco: Never  ?Substance and Sexual Activity  ? Alcohol use: Yes  ?  Comment: socially  ? Drug use: Yes  ?  Comment: [facility]C  ? Sexual activity: Yes  ?Other Topics Concern  ? Not on file  ?Social History  Narrative  ? ** Merged History Encounter **  ?    ? ?Social Determinants of Health  ? ?Financial Resource Strain: High Risk  ? Difficulty of Paying Living Expenses: Very hard  ?Food Insecurity: Food Insecurity Present  ? Worried About Programme researcher, broadcasting/film/video in the Last Year: Often true  ? Ran Out of Food in the Last Year: Often true  ?Transportation Needs: Unmet Transportation Needs  ? Lack of Transportation (Medical): No  ? Lack of Transportation (Non-Medical): Yes  ?Physical Activity: Sufficiently Active  ? Days of Exercise per Week: 4 days  ? Minutes of Exercise per Session: 60 min  ?Stress: Stress Concern Present  ? Feeling of Stress : Rather much  ?Social Connections: Moderately Isolated  ? Frequency of Communication with Friends and Family: More than three times a week  ? Frequency of Social Gatherings with Friends and Family: Twice a week  ? Attends Religious Services: 1 to 4 times per year  ? Active Member of Clubs or Organizations: No  ? Attends Banker Meetings: Never  ? Marital Status: Separated  ? ? ?No Known Allergies ? ?Outpatient Medications Prior to Visit  ?Medication Sig Dispense Refill  ? albuterol (PROVENTIL HFA;VENTOLIN HFA) 108 (90 BASE) MCG/ACT inhaler Inhale  2 puffs into the lungs every 6 (six) hours as needed for wheezing or shortness of breath. 3 Inhaler 3  ? buPROPion (WELLBUTRIN XL) 150 MG 24 hr tablet Take 1 tablet (150 mg total) by mouth every morning. 30 tablet 1  ? FLUoxetine (PROZAC) 40 MG capsule Take 1 capsule (40 mg total) by mouth daily. 30 capsule 1  ? Insulin Pen Needle 31G X 5 MM MISC Use 1 pen needle each daily at bedtime. 30 each 5  ? polyethylene glycol powder (GLYCOLAX/MIRALAX) 17 GM/SCOOP powder Take 17 g by mouth daily. 3350 g 1  ? traZODone (DESYREL) 50 MG tablet Take 1 tablet (50 mg total) by mouth at bedtime. 30 tablet 1  ? gabapentin (NEURONTIN) 300 MG capsule Take 1 capsule (300 mg total) by mouth at bedtime. 30 capsule 3  ? insulin glargine (LANTUS  SOLOSTAR) 100 UNIT/ML Solostar Pen Inject 10 Units into the skin daily. 30 mL 3  ? lisinopril (ZESTRIL) 5 MG tablet Take 1 tablet (5 mg total) by mouth daily. 30 tablet 3  ? azithromycin (ZITHROMAX) 250 MG tablet Take 2 tabs by mouth on day 1, then take 1 tab by mouth once daily (Patient not taking: Reported on 03/02/2022) 6 tablet 0  ? benzonatate (TESSALON) 100 MG capsule Take 2 capsules (200 mg total) by mouth 2 (two) times daily as needed for cough. (Patient not taking: Reported on 03/02/2022) 40 capsule 0  ? ?No facility-administered medications prior to visit.  ? ? ? ?ROS ?Review of Systems  ?Constitutional:  Negative for activity change and appetite change.  ?HENT:  Negative for sinus pressure and sore throat.   ?Eyes:  Negative for visual disturbance.  ?Respiratory:  Negative for cough, chest tightness and shortness of breath.   ?Cardiovascular:  Negative for chest pain and leg swelling.  ?Gastrointestinal:  Negative for abdominal distention, abdominal pain, constipation and diarrhea.  ?Endocrine: Negative.   ?Genitourinary:  Negative for dysuria.  ?Musculoskeletal:  Negative for joint swelling and myalgias.  ?Skin:  Positive for rash.  ?Allergic/Immunologic: Negative.   ?Neurological:  Negative for weakness, light-headedness and numbness.  ?Psychiatric/Behavioral:  Negative for dysphoric mood and suicidal ideas.   ? ?Objective:  ?BP (!) 158/101   Pulse 88   Ht 6' (1.829 m)   Wt 226 lb 9.6 oz (102.8 kg)   SpO2 98%   BMI 30.73 kg/m?  ? ?BP/Weight 03/02/2022 09/17/2021 04/12/2021  ?Systolic BP 158 153 161  ?Diastolic BP 101 106 99  ?Wt. (Lbs) 226.6 220.2 230  ?BMI 30.73 29.86 31.19  ?Some encounter information is confidential and restricted. Go to Review Flowsheets activity to see all data.  ? ? ? ? ?Physical Exam ?Constitutional:   ?   Appearance: He is well-developed.  ?Cardiovascular:  ?   Rate and Rhythm: Normal rate.  ?   Heart sounds: Normal heart sounds. No murmur heard. ?Pulmonary:  ?   Effort:  Pulmonary effort is normal.  ?   Breath sounds: Normal breath sounds. No wheezing or rales.  ?Chest:  ?   Chest wall: No tenderness.  ?Abdominal:  ?   General: Bowel sounds are normal. There is no distension.  ?   Palpations: Abdomen is soft. There is no mass.  ?   Tenderness: There is no abdominal tenderness.  ?Musculoskeletal:     ?   General: Normal range of motion.  ?   Right lower leg: No edema.  ?   Left lower leg: No edema.  ?Skin: ?  Comments: Flaking of the circumference of the scalp along with some erythematous patches in scalp  ?Neurological:  ?   Mental Status: He is alert and oriented to person, place, and time.  ?Psychiatric:     ?   Mood and Affect: Mood normal.  ? ? ?CMP Latest Ref Rng & Units 09/19/2021 04/12/2021 07/02/2015  ?Glucose 70 - 99 mg/dL 564(P) 329(J) 188(C)  ?BUN 6 - 20 mg/dL 17 14 13   ?Creatinine 0.76 - 1.27 mg/dL ) 1.66(A 6.30  ?Sodium 134 - 144 mmol/L 135 134(L) 136  ?Potassium 3.5 - 5.2 mmol/L 4.8 4.0 4.1  ?Chloride 96 - 106 mmol/L 97 100 104  ?CO2 20 - 29 mmol/L 22 25 26   ?Calcium 8.7 - 10.2 mg/dL 9.9 1.60) 9.0  ?Total Protein 6.0 - 8.5 g/dL 6.8 - -  ?Total Bilirubin 0.0 - 1.2 mg/dL 0.3 - -  ?Alkaline Phos 44 - 121 IU/L 121 - -  ?AST 0 - 40 IU/L 10 - -  ?ALT 0 - 44 IU/L 19 - -  ? ? ?Lipid Panel  ?   ?Component Value Date/Time  ? CHOL 204 (H) 09/19/2021 0957  ? TRIG 453 (H) 09/19/2021 0957  ? HDL 30 (L) 09/19/2021 0957  ? CHOLHDL 6.8 (H) 09/19/2021 0957  ? CHOLHDL 4.2 08/15/2014 1211  ? VLDL 41 (H) 08/15/2014 1211  ? LDLCALC 98 09/19/2021 0957  ? ? ?CBC ?   ?Component Value Date/Time  ? WBC 4.1 04/12/2021 1534  ? RBC 4.86 04/12/2021 1534  ? HGB 13.4 04/12/2021 1534  ? HCT 38.3 (L) 04/12/2021 1534  ? PLT 294 04/12/2021 1534  ? MCV 78.8 (L) 04/12/2021 1534  ? MCH 27.6 04/12/2021 1534  ? MCHC 35.0 04/12/2021 1534  ? RDW 12.7 04/12/2021 1534  ? LYMPHSABS 2.5 07/02/2015 2316  ? MONOABS 0.4 07/02/2015 2316  ? EOSABS 0.2 07/02/2015 2316  ? BASOSABS 0.0 07/02/2015 2316  ? ? ?Lab Results   ?Component Value Date  ? HGBA1C 13.6 (A) 03/02/2022  ? ? ?Assessment & Plan:  ?1. Type 2 diabetes mellitus with diabetic polyneuropathy, with long-term current use of insulin (HCC) ?Uncontrolled with A1c of 13.6; goal

## 2022-03-02 NOTE — Progress Notes (Signed)
Discuss erectile dysfunction ?Scalp peeling ? ?

## 2022-03-03 ENCOUNTER — Other Ambulatory Visit: Payer: Self-pay

## 2022-03-06 ENCOUNTER — Telehealth: Payer: Self-pay | Admitting: Family Medicine

## 2022-03-06 NOTE — Telephone Encounter (Signed)
Copied from [place] [id number]. Topic: General - Other >> Mar 06, 2022  9:36 AM Tessa Lerner A wrote: Reason for CRM: The patient has called for a doctors note for work   They missed work on 03/02/22 due to their appt   The patient's job's HR department is requesting the document   The patient would like it emailed to [email or web address] if possible   Please contact further if needed

## 2022-03-06 NOTE — Telephone Encounter (Signed)
Letter has been written and patient has been sent a mycahrt message. ?

## 2022-03-07 LAB — FSH/LH
FSH: 6.5 m[IU]/mL (ref 1.5–12.4)
LH: 11 m[IU]/mL — ABNORMAL HIGH (ref 1.7–8.6)

## 2022-03-07 LAB — CMP14+EGFR
ALT: 17 IU/L (ref 0–44)
AST: 12 IU/L (ref 0–40)
Albumin/Globulin Ratio: 2.4 — ABNORMAL HIGH (ref 1.2–2.2)
Albumin: 4.6 g/dL (ref 4.1–5.2)
Alkaline Phosphatase: 114 IU/L (ref 44–121)
BUN/Creatinine Ratio: 15 (ref 9–20)
BUN: 11 mg/dL (ref 6–20)
Bilirubin Total: 0.4 mg/dL (ref 0.0–1.2)
CO2: 23 mmol/L (ref 20–29)
Calcium: 9.1 mg/dL (ref 8.7–10.2)
Chloride: 98 mmol/L (ref 96–106)
Creatinine, Ser: 0.72 mg/dL — ABNORMAL LOW (ref 0.76–1.27)
Globulin, Total: 1.9 g/dL (ref 1.5–4.5)
Glucose: 361 mg/dL — ABNORMAL HIGH (ref 70–99)
Potassium: 4.6 mmol/L (ref 3.5–5.2)
Sodium: 135 mmol/L (ref 134–144)
Total Protein: 6.5 g/dL (ref 6.0–8.5)
eGFR: 126 mL/min/{1.73_m2} (ref >59)

## 2022-03-07 LAB — TESTOSTERONE, FREE, TOTAL, SHBG
Sex Hormone Binding: 20.9 nmol/L (ref 16.5–55.9)
Testosterone, Free: 14 pg/mL (ref 8.7–25.1)
Testosterone: 457 ng/dL (ref 264–916)

## 2022-03-07 LAB — LP+NON-HDL CHOLESTEROL
Cholesterol, Total: 197 mg/dL (ref 100–199)
HDL: 35 mg/dL — ABNORMAL LOW (ref >39)
LDL Chol Calc (NIH): 129 mg/dL — ABNORMAL HIGH (ref 0–99)
Total Non-HDL-Chol (LDL+VLDL): 162 mg/dL — ABNORMAL HIGH (ref 0–129)
Triglycerides: 183 mg/dL — ABNORMAL HIGH (ref 0–149)
VLDL Cholesterol Cal: 33 mg/dL (ref 5–40)

## 2022-03-16 ENCOUNTER — Other Ambulatory Visit (HOSPITAL_COMMUNITY): Payer: Self-pay

## 2022-03-17 ENCOUNTER — Encounter (HOSPITAL_COMMUNITY): Payer: Self-pay | Admitting: Physician Assistant

## 2022-03-17 ENCOUNTER — Telehealth (INDEPENDENT_AMBULATORY_CARE_PROVIDER_SITE_OTHER): Payer: No Payment, Other | Admitting: Physician Assistant

## 2022-03-17 ENCOUNTER — Other Ambulatory Visit: Payer: Self-pay

## 2022-03-17 DIAGNOSIS — F4310 Post-traumatic stress disorder, unspecified: Secondary | ICD-10-CM | POA: Diagnosis not present

## 2022-03-17 DIAGNOSIS — F411 Generalized anxiety disorder: Secondary | ICD-10-CM

## 2022-03-17 DIAGNOSIS — F333 Major depressive disorder, recurrent, severe with psychotic symptoms: Secondary | ICD-10-CM

## 2022-03-17 MED ORDER — BUSPIRONE HCL 30 MG PO TABS
30.0000 mg | ORAL_TABLET | Freq: Every day | ORAL | 2 refills | Status: DC
  Filled 2022-03-17: qty 30, 30d supply, fill #0

## 2022-03-17 MED ORDER — FLUOXETINE HCL 20 MG PO CAPS
60.0000 mg | ORAL_CAPSULE | Freq: Every day | ORAL | 2 refills | Status: DC
  Filled 2022-03-17: qty 90, 30d supply, fill #0

## 2022-03-17 MED ORDER — TRAZODONE HCL 50 MG PO TABS
50.0000 mg | ORAL_TABLET | Freq: Every day | ORAL | 1 refills | Status: AC
  Filled 2022-03-17: qty 30, 30d supply, fill #0

## 2022-03-17 NOTE — Progress Notes (Signed)
BH MD/PA/NP OP Progress Note ? ?Virtual Visit via Video Note ? ?I connected with Ernest Mccann on 03/17/22 at  8:30 AM EDT by a video enabled telemedicine application and verified that I am speaking with the correct person using two identifiers. ? ?Location: ?Patient: Home ?Provider: Clinic ?  ?I discussed the limitations of evaluation and management by telemedicine and the availability of in person appointments. The patient expressed understanding and agreed to proceed. ? ?Follow Up Instructions: ? ?I discussed the assessment and treatment plan with the patient. The patient was provided an opportunity to ask questions and all were answered. The patient agreed with the plan and demonstrated an understanding of the instructions. ?  ?The patient was advised to call back or seek an in-person evaluation if the symptoms worsen or if the condition fails to improve as anticipated. ? ?I provided 16 minutes of non-face-to-face time during this encounter. ? ?[name], PA ?  ? ?03/17/2022 6:56 PM ?[name]  ?MRN:  [id number] ? ?Chief Complaint:  ?Chief Complaint  ?Patient presents with  ? Follow-up  ? Medication Management  ? ?HPI:  ? ?Ernest Mccann is a 31 year old male with a past psychiatric history significant for major depressive disorder with psychotic features, generalized anxiety disorder, and PTSD who presents to [facility] via virtual video visit for follow-up and medication management.  Patient is currently being managed on the following medications: ? ?Bupropion (Wellbutrin XL) 150 mg 24-hour tablet daily ?Trazodone 50 mg at bedtime ?Fluoxetine 40 mg daily ? ?Regards to his Prozac usage, patient states that the medication is fine and he feels mellowed out while taking it.  He reports that the use of his trazodone has been amazing and that he has been able to get rest from the medication.  Patient has stopped taking Wellbutrin stating that he  experienced suicidal thoughts and not feeling well while on the medication.  While taking bupropion, patient states that at one point he had a bunch of pills in his hand thinking about wanting to harm himself.  Patient continues to endorse depressive episodes every day characterized by feelings of loneliness and hopelessness.  Patient endorses anxiety and rates his anxiety at an 8 out of 10.  Patient's stressors include lack of transport, current job, filing for divorce, and worrying about his daughter.  A PHQ-9 screen was performed with the patient scoring a 23.  A GAD-7 screen was also performed with the patient scoring a 21. ? ?Patient is alert and oriented x4, calm, cooperative, and fully engaged in conversation during the encounter.  Patient endorses numb mood.  Patient denies suicidal or homicidal ideations.  He further denies auditory or visual hallucinations and does not appear to be responding to internal/external stimuli.  Patient endorses fair sleep and receives on average 6 hours of sleep each night.  Patient endorses good appetite and eats on average 3 meals per day.  Patient denies alcohol consumption, tobacco use, and illicit drug use.  Patient does report that he last used cannabis edibles 2 weeks ago. ? ?Visit Diagnosis:  ?  ICD-10-CM   ?1. Severe episode of recurrent major depressive disorder, with psychotic features ([place])  F33.3 traZODone (DESYREL) 50 MG tablet  ?  FLUoxetine (PROZAC) 20 MG capsule  ?  ?2. GAD (generalized anxiety disorder)  F41.1 busPIRone (BUSPAR) 30 MG tablet  ?  FLUoxetine (PROZAC) 20 MG capsule  ?  ?3. PTSD (post-traumatic stress disorder)  F43.10 FLUoxetine (PROZAC)  20 MG capsule  ?  ? ? ?Past Psychiatric History:  ?Major depressive disorder ?PTSD ?Generalized anxiety disorder ? ?Past Medical History:  ?Past Medical History:  ?Diagnosis Date  ? Depression   ? Diabetes mellitus without complication ([place])   ? Diabetes mellitus without complication ([place])   ? Type II  ?  ?Past  Surgical History:  ?Procedure Laterality Date  ? KNEE ARTHROPLASTY    ? TONSILLECTOMY    ? ? ?Family Psychiatric History:  ?Father - Anxiety, depression, and PTSD (Donnellson history) ?Mother - Depression and anxiety ?Grandfather (maternal) - Depression and PTSD Geophysical data processor) ?Patient states that a number of his uncles and aunts have depression ? ?Family History:  ?Family History  ?Problem Relation Age of Onset  ? Diabetes Mother   ? Diabetes Father   ? Heart disease Father   ? Renal Disease Father   ? Cancer Maternal Aunt   ? Heart disease Maternal Grandfather   ? ? ?Social History:  ?Social History  ? ?Socioeconomic History  ? Marital status: Married  ?  Spouse name: Not on file  ? Number of children: Not on file  ? Years of education: Not on file  ? Highest education level: Not on file  ?Occupational History  ? Not on file  ?Tobacco Use  ? Smoking status: Never  ? Smokeless tobacco: Never  ?Substance and Sexual Activity  ? Alcohol use: Yes  ?  Comment: socially  ? Drug use: Yes  ?  Comment: THC  ? Sexual activity: Yes  ?Other Topics Concern  ? Not on file  ?Social History Narrative  ? ** Merged History Encounter **  ?    ? ?Social Determinants of Health  ? ?Financial Resource Strain: High Risk  ? Difficulty of Paying Living Expenses: Very hard  ?Food Insecurity: Food Insecurity Present  ? Worried About Charity fundraiser in the Last Year: Often true  ? Ran Out of Food in the Last Year: Often true  ?Transportation Needs: Unmet Transportation Needs  ? Lack of Transportation (Medical): No  ? Lack of Transportation (Non-Medical): Yes  ?Physical Activity: Sufficiently Active  ? Days of Exercise per Week: 4 days  ? Minutes of Exercise per Session: 60 min  ?Stress: Stress Concern Present  ? Feeling of Stress : Rather much  ?Social Connections: Moderately Isolated  ? Frequency of Communication with Friends and Family: More than three times a week  ? Frequency of Social Gatherings with Friends and Family: Twice a week  ?  Attends Religious Services: 1 to 4 times per year  ? Active Member of Clubs or Organizations: No  ? Attends Archivist Meetings: Never  ? Marital Status: Separated  ? ? ?Allergies: No Known Allergies ? ?Metabolic Disorder Labs: ?Lab Results  ?Component Value Date  ? HGBA1C 13.6 (A) 03/02/2022  ? ?No results found for: PROLACTIN ?Lab Results  ?Component Value Date  ? CHOL 197 03/02/2022  ? TRIG 183 (H) 03/02/2022  ? HDL 35 (L) 03/02/2022  ? CHOLHDL 6.8 (H) 09/19/2021  ? VLDL 41 (H) 08/15/2014  ? LDLCALC 129 (H) 03/02/2022  ? [place] 98 09/19/2021  ? ?Lab Results  ?Component Value Date  ? TSH 3.426 08/15/2014  ? TSH 3.613 Test methodology is 3rd generation TSH 12/20/2009  ? ? ?Therapeutic Level Labs: ?No results found for: LITHIUM ?No results found for: VALPROATE ?No components found for:  CBMZ ? ?Current Medications: ?Current Outpatient Medications  ?Medication Sig Dispense Refill  ? busPIRone (BUSPAR)  30 MG tablet Take 1 tablet (30 mg total) by mouth daily. 30 tablet 2  ? FLUoxetine (PROZAC) 20 MG capsule Take 3 capsules (60 mg total) by mouth daily. 90 capsule 2  ? albuterol (PROVENTIL HFA;VENTOLIN HFA) 108 (90 BASE) MCG/ACT inhaler Inhale 2 puffs into the lungs every 6 (six) hours as needed for wheezing or shortness of breath. 3 Inhaler 3  ? azithromycin (ZITHROMAX) 250 MG tablet Take 2 tabs by mouth on day 1, then take 1 tab by mouth once daily (Patient not taking: Reported on 03/02/2022) 6 tablet 0  ? benzonatate (TESSALON) 100 MG capsule Take 2 capsules (200 mg total) by mouth 2 (two) times daily as needed for cough. (Patient not taking: Reported on 03/02/2022) 40 capsule 0  ? Dulaglutide (TRULICITY) [id number] 0000000 SOPN Inject 0.75 mg into the skin once a week. For 4 weeks then increase to 1.5 mg thereafter. 2 mL 1  ? gabapentin (NEURONTIN) 300 MG capsule Take 1 capsule (300 mg total) by mouth at bedtime. 30 capsule 6  ? Insulin Glargine (BASAGLAR KWIKPEN) 100 UNIT/ML Inject 20 Units into the skin daily.  6 mL 3  ? Insulin Pen Needle 31G X 5 MM MISC Use 1 pen needle each daily at bedtime. 30 each 5  ? lisinopril (ZESTRIL) 10 MG tablet Take 1 tablet (10 mg total) by mouth daily. 30 tablet 6  ? polyethylene glyco

## 2022-03-19 ENCOUNTER — Other Ambulatory Visit: Payer: Self-pay

## 2022-03-20 ENCOUNTER — Ambulatory Visit (INDEPENDENT_AMBULATORY_CARE_PROVIDER_SITE_OTHER): Payer: No Payment, Other | Admitting: Licensed Clinical Social Worker

## 2022-03-20 ENCOUNTER — Other Ambulatory Visit (HOSPITAL_COMMUNITY): Payer: Self-pay

## 2022-03-20 DIAGNOSIS — F411 Generalized anxiety disorder: Secondary | ICD-10-CM

## 2022-03-20 DIAGNOSIS — F4310 Post-traumatic stress disorder, unspecified: Secondary | ICD-10-CM

## 2022-03-20 DIAGNOSIS — F333 Major depressive disorder, recurrent, severe with psychotic symptoms: Secondary | ICD-10-CM

## 2022-03-20 NOTE — Plan of Care (Signed)
Pt agreeable to plan  ?

## 2022-03-20 NOTE — Progress Notes (Signed)
? ?  THERAPIST PROGRESS NOTE ? ?Virtual Visit via Video Note ? ?I connected with Ernest Mccann on 03/20/22 at  9:00 AM EDT by a video enabled telemedicine application and verified that I am speaking with the correct person using two identifiers. ? ?Location: ?Patient: [facility]  ?Provider: Providers Home  ?  ?I discussed the limitations of evaluation and management by telemedicine and the availability of in person appointments. The patient expressed understanding and agreed to proceed. ? ? ?  ?I discussed the assessment and treatment plan with the patient. The patient was provided an opportunity to ask questions and all were answered. The patient agreed with the plan and demonstrated an understanding of the instructions. ?  ?The patient was advised to call back or seek an in-person evaluation if the symptoms worsen or if the condition fails to improve as anticipated. ? ?I provided 40 minutes of non-face-to-face time during this encounter. ? ? ?[name], LCSW  ?Participation Level: Active ? ?Behavioral Response: CasualAlertAnxious and Depressed ? ?Type of Therapy: Individual Therapy ? ?Treatment Goals addressed: treatment plan updated to comply with [place] med[place] guidelines  ? ?ProgressTowards Goals: Progressing ? ?Interventions: CBT, Motivational Interviewing, and Supportive ? ?Suicidal/Homicidal: Nowithout intent/plan ? ?Therapist Response:  ? ? ?Pt was alert and oriented x 5. He was dressed casually and engaged well in therapy session. He was pleasant, cooperative, and maintained good eye contact. He presented with depressed and anxious mood/affect.  ? Pt reports primary stressor is medication management. He reports increased suicidal ideations. LCSW [place] severity rating scale and pt scored high risk. He does not currently have suicidal ideations today and reports no intent to conduct his suicidal thoughts. LCSW did contract pt for safety and charted in in [facility] tab of chart.  LCSW provided pt resources for suicide hotline and behavioral health urgent care. Pt was agreeable to plan. Next step will be to follow up with [facility] nursing line to be advised on medication he feels are increasing suicidal ideations.  ? Interventions: LCSW administer PHQ-9, GAD-7, and [place] suicide severity rating scale. LCSW provided pt with positive affirmations to help with negative thoughts. LCSW provided pt education on grounding technique for 5,4,3,2,1 and emailed it to patient  ?Plan: Return again in 4 weeks. ? ?Diagnosis: Severe episode of recurrent major depressive disorder, with psychotic features (H[facility]) ? ?GAD (generalized anxiety disorder) ? ?PTSD (post-traumatic stress disorder) ? ?Collaboration of Care: Medication Management [facility]  ? ?Patient/Guardian was advised Release of Information must be obtained prior to any record release in order to collaborate their care with an outside provider. Patient/Guardian was advised if they have not already done so to contact the registration department to sign all necessary forms in order for [place] to release information regarding their care.  ? ?Consent: Patient/Guardian gives verbal consent for treatment and assignment of benefits for services provided during this visit. Patient/Guardian expressed understanding and agreed to proceed.  ? ?[name], LCSW ?03/20/2022 ? ?

## 2022-04-02 ENCOUNTER — Encounter: Payer: Self-pay | Admitting: Family Medicine

## 2022-04-02 ENCOUNTER — Other Ambulatory Visit (HOSPITAL_COMMUNITY): Payer: Self-pay

## 2022-04-02 ENCOUNTER — Other Ambulatory Visit: Payer: Self-pay | Admitting: Family Medicine

## 2022-04-02 ENCOUNTER — Other Ambulatory Visit: Payer: Self-pay

## 2022-04-02 ENCOUNTER — Ambulatory Visit: Payer: Self-pay | Attending: Family Medicine | Admitting: Pharmacist

## 2022-04-02 ENCOUNTER — Encounter: Payer: Self-pay | Admitting: Pharmacist

## 2022-04-02 DIAGNOSIS — I10 Essential (primary) hypertension: Secondary | ICD-10-CM | POA: Insufficient documentation

## 2022-04-02 DIAGNOSIS — Z794 Long term (current) use of insulin: Secondary | ICD-10-CM

## 2022-04-02 DIAGNOSIS — Z7985 Long-term (current) use of injectable non-insulin antidiabetic drugs: Secondary | ICD-10-CM | POA: Insufficient documentation

## 2022-04-02 DIAGNOSIS — E1142 Type 2 diabetes mellitus with diabetic polyneuropathy: Secondary | ICD-10-CM

## 2022-04-02 DIAGNOSIS — F32A Depression, unspecified: Secondary | ICD-10-CM | POA: Insufficient documentation

## 2022-04-02 DIAGNOSIS — F419 Anxiety disorder, unspecified: Secondary | ICD-10-CM | POA: Insufficient documentation

## 2022-04-02 DIAGNOSIS — Z833 Family history of diabetes mellitus: Secondary | ICD-10-CM | POA: Insufficient documentation

## 2022-04-02 DIAGNOSIS — Z8249 Family history of ischemic heart disease and other diseases of the circulatory system: Secondary | ICD-10-CM | POA: Insufficient documentation

## 2022-04-02 MED ORDER — SILDENAFIL CITRATE 50 MG PO TABS
50.0000 mg | ORAL_TABLET | Freq: Every day | ORAL | 1 refills | Status: DC | PRN
  Filled 2022-04-02: qty 10, 30d supply, fill #0
  Filled 2022-06-02: qty 10, 30d supply, fill #1

## 2022-04-02 MED ORDER — BASAGLAR KWIKPEN 100 UNIT/ML ~~LOC~~ SOPN
36.0000 [IU] | PEN_INJECTOR | Freq: Every day | SUBCUTANEOUS | 0 refills | Status: DC
  Filled 2022-04-02: qty 12, 33d supply, fill #0

## 2022-04-02 MED ORDER — TRULICITY 1.5 MG/0.5ML ~~LOC~~ SOAJ
1.5000 mg | SUBCUTANEOUS | 2 refills | Status: DC
  Filled 2022-04-02: qty 2, 28d supply, fill #0
  Filled 2022-05-04: qty 2, 28d supply, fill #1
  Filled 2022-06-02: qty 2, 28d supply, fill #2

## 2022-04-02 NOTE — Progress Notes (Signed)
? ? ?  S:    ?PCP: Dr. Alvis Lemmings  ? ?No chief complaint on file. ? ?Ernest Mccann is a 31 y.o. male who presents for diabetes evaluation, education, and management. PMH is significant for type 2 diabetes mellitus (A1c 13.6), anxiety and depression, hypertension . Patient was referred and last seen by Primary Care Provider, Dr. Alvis Lemmings, on 03/02/22. ? ?Today, patient arrives in good spirits and presents without assistance.  ? ?Patient reports diabetes is longstanding. His A1c last month was 13.6% despite compliance with Lantus. Lantus dose was increased to 20 units. Additionally, Trulicity was added to his regimen. Denies any NV, abdominal pain. No reported changes in vision. He is not currently on metformin d/t GI issues with this in the past.  ? ?Family/Social History:  ?-Fhx: DM, heart disease, renal disease  ?-Tobacco: never smoker  ?-Alcohol: socially ? ?Current diabetes medications include: Basaglar 30 units daily, Trulicity 0.75 mg weekl   ?Patient reports taking all medications as prescribed. ? ?Insurance coverage: self pay thru our pharmacy ? ?Patient denies hypoglycemic events. ? ?Reported home fasting blood sugars: 260s-270s  ? ?Patient denies nocturia (nighttime urination).  ?Patient denies neuropathy (nerve pain). ?Patient denies visual changes. ?Patient reports self foot exams.  ? ?Patient reported dietary habits:  ?-Reports decreased snacking on sweet foods  ?-Decreasing portion sizes ? ?Patient-reported exercise habits:  ?-Recognizes that he needs to do this more ?-Plans to increase his exercise over the next couple of months  ? ?O:  ? ?Lab Results  ?Component Value Date  ? HGBA1C 13.6 (A) 03/02/2022  ? ?There were no vitals filed for this visit. ? ?Lipid Panel  ?   ?Component Value Date/Time  ? CHOL 197 03/02/2022 1001  ? TRIG 183 (H) 03/02/2022 1001  ? HDL 35 (L) 03/02/2022 1001  ? CHOLHDL 6.8 (H) 09/19/2021 0957  ? CHOLHDL 4.2 08/15/2014 1211  ? VLDL 41 (H) 08/15/2014 1211  ? LDLCALC 129 (H)  03/02/2022 1001  ? ? ?Clinical Atherosclerotic Cardiovascular Disease (ASCVD): No  ?The ASCVD Risk score (Arnett DK, et al., 2019) failed to calculate for the following reasons: ?  The 2019 ASCVD risk score is only valid for ages 35 to 69  ? ? ?A/P: ?Diabetes longstanding currently uncontrolled but reported CBGs are improving. Patient is able to verbalize appropriate hypoglycemia management plan. Medication adherence appears appropriate. ?-Increased dose of Trulicity to 1.5 mg weekly.   ?-Increased dose of Basaglar to 36 units daily.   ?-Extensively discussed pathophysiology of diabetes, recommended lifestyle interventions, dietary effects on blood sugar control.  ?-Counseled on s/sx of and management of hypoglycemia.  ?-Next A1c anticipated 05/2022.  ? ?Written patient instructions provided. Patient verbalized understanding of treatment plan. Total time in face to face counseling 30 minutes.   ? ?Follow up pharmacist clinic visit in 1 month. ? ?Butch Penny, PharmD, BCACP, CPP ?Clinical Pharmacist ?[facility] ?[phone] ? ? ?

## 2022-04-03 ENCOUNTER — Other Ambulatory Visit: Payer: Self-pay

## 2022-04-06 ENCOUNTER — Other Ambulatory Visit: Payer: Self-pay

## 2022-04-07 ENCOUNTER — Other Ambulatory Visit: Payer: Self-pay

## 2022-04-10 ENCOUNTER — Ambulatory Visit (INDEPENDENT_AMBULATORY_CARE_PROVIDER_SITE_OTHER): Payer: No Payment, Other | Admitting: Licensed Clinical Social Worker

## 2022-04-10 ENCOUNTER — Encounter: Payer: Self-pay | Admitting: Family Medicine

## 2022-04-10 ENCOUNTER — Encounter (HOSPITAL_COMMUNITY): Payer: Self-pay

## 2022-04-10 DIAGNOSIS — F411 Generalized anxiety disorder: Secondary | ICD-10-CM

## 2022-04-10 DIAGNOSIS — F333 Major depressive disorder, recurrent, severe with psychotic symptoms: Secondary | ICD-10-CM

## 2022-04-10 NOTE — Progress Notes (Signed)
? ?  THERAPIST PROGRESS NOTE ? ?Virtual Visit via Video Note ? ?I connected with Ernest Mccann on 04/10/22 at  9:00 AM EDT by a video enabled telemedicine application and verified that I am speaking with the correct person using two identifiers. ? ?Location: ?Patient: [facility]  ?Provider: Providers Home  ?  ?I discussed the limitations of evaluation and management by telemedicine and the availability of in person appointments. The patient expressed understanding and agreed to proceed. ? ? ?  ?I discussed the assessment and treatment plan with the patient. The patient was provided an opportunity to ask questions and all were answered. The patient agreed with the plan and demonstrated an understanding of the instructions. ?  ?The patient was advised to call back or seek an in-person evaluation if the symptoms worsen or if the condition fails to improve as anticipated. ? ?I provided 30 minutes of non-face-to-face time during this encounter. ? ? ?Ernest Cooks, LCSW  ? ?Participation Level: Active ? ?Behavioral Response: CasualAlertAnxious and Depressed ? ?Type of Therapy: Individual Therapy ? ?Treatment Goals addressed: decrease GAD-7 below 5 ? ?ProgressTowards Goals: Progressing ? ?Interventions: CBT, Motivational Interviewing, and Supportive ? ? ?Suicidal/Homicidal: Nowithout intent/plan ? ?Therapist Response:  ? ?Pt was alert and oriented x 5. He was dressed casually and engaged well in therapy session. Pt presented with depressed and anxious mood/affect. He was pleasant, cooperative, and maintained good eye contact.  ? Pt came in today's session driving. LCSW stated to pt that driving is not acceptable while doing therapy due to the danger. Pt pulled over and conducted session that way. LCSW spoke with pt about stressor pt reports housing and ex relationship. He reports that he has a new girlfriend that he brings over to the house, but the person that owns the house and pt roommate does not want  people over unless he knows them. Ernest Mccann reports open communication with his roommate moving forward and it has been resolved. Other stressor for pt are ex relationship. He reports that his ex-partner has kept his child away from him. Pt reports he is not pursuing legal action to have a custody order in place.  ? Intervention/Plan: LCSW administered GAD-7 and pt decreased score by three points. Pt increased PHQ-9 score. LCSW used CBT to educate pt on PHQ-9 and GAD-7. LCSW educated pt on risk and benefits of taking medication. Pt reports he stopped taking his medication as he reports one of the side affects was increase SI. Pt has not taken medications in 3 weeks and still having SI. Pt agreeable to restart medication and take them how they were prescribed.  ? ?Plan: Return again in 4 weeks. ? ?Diagnosis: No diagnosis found. ? ?Collaboration of Care: Other None in today's session  ? ?Patient/Guardian was advised Release of Information must be obtained prior to any record release in order to collaborate their care with an outside provider. Patient/Guardian was advised if they have not already done so to contact the registration department to sign all necessary forms in order for [place] to release information regarding their care.  ? ?Consent: Patient/Guardian gives verbal consent for treatment and assignment of benefits for services provided during this visit. Patient/Guardian expressed understanding and agreed to proceed.  ? ?Ernest Cooks, LCSW ?04/10/2022 ? ?

## 2022-04-10 NOTE — Plan of Care (Signed)
?  Problem: Depression CCP Problem  1 MDD  ?Goal:  walk 2 x weekly  ?Outcome: Not Progressing ?Goal: LTG: Anan WILL SCORE LESS THAN 10 ON THE PATIENT HEALTH QUESTIONNAIRE (PHQ-9) ?Outcome: Not Progressing ?Goal: STG: Ernest Mccann WILL ATTEND AT LEAST 80% OF SCHEDULED GROUP PSYCHOTHERAPY SESSIONS ?Outcome: Progressing ?Goal: STG: Ernest Mccann WILL COMPLETE AT LEAST 80% OF ASSIGNED HOMEWORK ?Outcome: Progressing ?Goal: Identify 3 triggers for depression  ?Outcome: Progressing ?  ?Problem: Anxiety Disorder CCP Problem  1 GAD ?Goal: identify 3 triggers for anxiety  ?Outcome: Progressing ?Goal: LTG: Patient will score less than 5 on the Generalized Anxiety Disorder 7 Scale (GAD-7) ?Outcome: Progressing ?Goal: STG: Patient will practice problem solving skills 3 times per week for the next 4 weeks ?Outcome: Progressing ?  ?

## 2022-05-01 ENCOUNTER — Ambulatory Visit (INDEPENDENT_AMBULATORY_CARE_PROVIDER_SITE_OTHER): Payer: No Payment, Other | Admitting: Licensed Clinical Social Worker

## 2022-05-01 DIAGNOSIS — F333 Major depressive disorder, recurrent, severe with psychotic symptoms: Secondary | ICD-10-CM

## 2022-05-01 DIAGNOSIS — F411 Generalized anxiety disorder: Secondary | ICD-10-CM

## 2022-05-01 NOTE — Progress Notes (Signed)
    S:    PCP: Dr. Margarita Rana   No chief complaint on file.  Ernest Mccann is a 31 y.o. male who presents for diabetes evaluation, education, and management. PMH is significant for type 2 diabetes mellitus (A1c 13.6), anxiety and depression, hypertension. Patient was referred and last seen by Primary Care Provider, Dr. Margarita Rana, on 03/02/22. Last seen by CPP [id number] when Trulicity and Basaglar were increased.   Today, patient arrives in good spirits and presents without assistance. Pt endorses initial nausea with increased Trulicity dose and Basaglar 36u daily. Denies any changes in vision.  Denies any abdominal pain or vomiting. Of note, he thinks he was nauseous d/t relative hypoglycemia. The day after he saw me, his CBG was 150 fasting when he felt nauseated. Because of this he reduced his Basaglar back to 30 units daily.   Family/Social History:  -Fhx: DM, heart disease, renal disease  -Tobacco: never smoker  -Alcohol: socially  Current diabetes medications include: Basaglar 36 units daily (taking 30 units), Trulicity 1.5 mg weekly Patient reports taking all medications but takes a different insulin dose than prescribed.   Insurance coverage: self pay thru our pharmacy  Patient denies objective hypoglycemia. Endorses relative hypoglycmeia/nausea. Tells me he gets aware when his blood sugar is in the 150s.   Reported home fasting blood sugars:  Had 150s at home for the first two days after his last visit with me. Since he reduced his Basaglar dose, he has not been checking.   Patient denies nocturia (nighttime urination).  Patient denies neuropathy (nerve pain). Patient denies visual changes. Patient reports self foot exams.   Patient reported dietary habits:  -Reports decreased snacking on sweet foods  -Decreasing portion sizes  Patient-reported exercise habits:  -Recognizes that he needs to do this more -Has a membership to [employer] but has not started going.   O:    Lab Results  Component Value Date   HGBA1C 13.6 (A) 03/02/2022   There were no vitals filed for this visit.  Lipid Panel     Component Value Date/Time   CHOL 197 03/02/2022 1001   TRIG 183 (H) 03/02/2022 1001   HDL 35 (L) 03/02/2022 1001   CHOLHDL 6.8 (H) 09/19/2021 0957   CHOLHDL 4.2 08/15/2014 1211   VLDL 41 (H) 08/15/2014 1211   LDLCALC 129 (H) 03/02/2022 1001    Clinical Atherosclerotic Cardiovascular Disease (ASCVD): No  The ASCVD Risk score (Arnett DK, et al., 2019) failed to calculate for the following reasons:   The 2019 ASCVD risk score is only valid for ages 42 to 53    A/P: Diabetes longstanding currently uncontrolled but reported CBGs are improving. Patient is able to verbalize appropriate hypoglycemia management plan. Medication adherence appears appropriate. -Continue Trulicity 1.5 mg weekly.   -Continue Basaglar 30 units daily. Instructed pt to increase dose gradually. Gave specific instructions to increase 2 units every 3 days unitl fasting CBGs are <150.   -Extensively discussed pathophysiology of diabetes, recommended lifestyle interventions, dietary effects on blood sugar control.  -Counseled on s/sx of and management of hypoglycemia.  -Next A1c anticipated 05/2022.   Written patient instructions provided. Patient verbalized understanding of treatment plan. Total time in face to face counseling 30 minutes.    Follow up PCP clinic visit in 1 month.  Benard Halsted, PharmD, [place], [place] [phone]

## 2022-05-01 NOTE — Plan of Care (Signed)
  Problem: Depression CCP Problem  1 MDD  Goal: LTG: Mahad WILL SCORE LESS THAN 10 ON THE PATIENT HEALTH QUESTIONNAIRE (PHQ-9) Outcome: Progressing Goal: STG: [place] WILL ATTEND AT LEAST 80% OF SCHEDULED GROUP PSYCHOTHERAPY SESSIONS Outcome: Progressing Goal: STG: Kaisen WILL COMPLETE AT LEAST 80% OF ASSIGNED HOMEWORK Outcome: Progressing Goal: Identify 3 triggers for depression  Outcome: Progressing   Problem: Anxiety Disorder CCP Problem  1 GAD Goal: identify 3 triggers for anxiety  Outcome: Progressing Goal: LTG: Patient will score less than 5 on the Generalized Anxiety Disorder 7 Scale (GAD-7) Outcome: Progressing Goal: STG: Patient will practice problem solving skills 3 times per week for the next 4 weeks Outcome: Progressing   Problem: Depression CCP Problem  1 MDD  Goal:  walk 2 x weekly  Outcome: Not Progressing   Problem: Depression CCP Problem  1 MDD  Intervention: Administer the PHQ-9 or MADRS weekly for 4 weeks Intervention: WORK WITH Kurt TO IDENTIFY THE MAJOR COMPONENTS OF A RECENT EPISODE OF DEPRESSION: PHYSICAL SYMPTOMS, MAJOR THOUGHTS AND IMAGES, AND MAJOR BEHAVIORS THEY EXPERIENCED   Problem: Anxiety Disorder CCP Problem  1 GAD Intervention: Discuss risks and benefits of medication treatment options for this problem and prescribe as indicated Intervention: Encourage patient to take psychotropic medication as prescribed Intervention: Review results of GAD-7 with the patient to track progress Intervention: Perform psychoeducation regarding anxiety disorders

## 2022-05-01 NOTE — Progress Notes (Signed)
   THERAPIST PROGRESS NOTE  Virtual Visit via Video Note  I connected with Colan A Sattler on 05/01/22 at  8:00 AM EDT by a video enabled telemedicine application and verified that I am speaking with the correct person using two identifiers.  Location: Patient: [facility]  Provider: Providers Home    I discussed the limitations of evaluation and management by telemedicine and the availability of in person appointments. The patient expressed understanding and agreed to proceed.     I discussed the assessment and treatment plan with the patient. The patient was provided an opportunity to ask questions and all were answered. The patient agreed with the plan and demonstrated an understanding of the instructions.   The patient was advised to call back or seek an in-person evaluation if the symptoms worsen or if the condition fails to improve as anticipated.  I provided 40 minutes of non-face-to-face time during this encounter.   [name], LCSW   Participation Level: Active  Behavioral Response: CasualAlertAnxious and Depressed  Type of Therapy: Individual Therapy  Treatment Goals addressed:   ProgressTowards Goals: Progressing  Interventions: CBT and Motivational Interviewing  Summary: JERONE CUDMORE is a 31 y.o. male who presents with    Suicidal/Homicidal: Nowithout intent/plan  Therapist Response:       Plan: Return again in 4 weeks.  Diagnosis: No diagnosis found.  Collaboration of Care: Other none today   Patient/Guardian was advised Release of Information must be obtained prior to any record release in order to collaborate their care with an outside provider. Patient/Guardian was advised if they have not already done so to contact the registration department to sign all necessary forms in order for [place] to release information regarding their care.   Consent: Patient/Guardian gives verbal consent for treatment and assignment of benefits for  services provided during this visit. Patient/Guardian expressed understanding and agreed to proceed.   [name], LCSW 05/01/2022

## 2022-05-04 ENCOUNTER — Ambulatory Visit: Payer: Medicaid Other | Attending: Family Medicine | Admitting: Pharmacist

## 2022-05-04 ENCOUNTER — Other Ambulatory Visit: Payer: Self-pay

## 2022-05-04 DIAGNOSIS — Z7985 Long-term (current) use of injectable non-insulin antidiabetic drugs: Secondary | ICD-10-CM | POA: Insufficient documentation

## 2022-05-04 DIAGNOSIS — E1142 Type 2 diabetes mellitus with diabetic polyneuropathy: Secondary | ICD-10-CM

## 2022-05-04 DIAGNOSIS — F418 Other specified anxiety disorders: Secondary | ICD-10-CM | POA: Insufficient documentation

## 2022-05-04 DIAGNOSIS — E11649 Type 2 diabetes mellitus with hypoglycemia without coma: Secondary | ICD-10-CM | POA: Insufficient documentation

## 2022-05-04 DIAGNOSIS — I10 Essential (primary) hypertension: Secondary | ICD-10-CM | POA: Insufficient documentation

## 2022-05-04 DIAGNOSIS — Z794 Long term (current) use of insulin: Secondary | ICD-10-CM

## 2022-05-04 MED ORDER — BASAGLAR KWIKPEN 100 UNIT/ML ~~LOC~~ SOPN
36.0000 [IU] | PEN_INJECTOR | Freq: Every day | SUBCUTANEOUS | 2 refills | Status: DC
  Filled 2022-05-04: qty 12, 33d supply, fill #0

## 2022-05-05 ENCOUNTER — Other Ambulatory Visit: Payer: Self-pay

## 2022-05-06 ENCOUNTER — Encounter: Payer: Self-pay | Admitting: Family Medicine

## 2022-05-06 ENCOUNTER — Ambulatory Visit: Payer: Medicaid Other | Attending: Physician Assistant | Admitting: Physician Assistant

## 2022-05-06 ENCOUNTER — Other Ambulatory Visit (HOSPITAL_COMMUNITY)
Admission: EM | Admit: 2022-05-06 | Discharge: 2022-05-06 | Disposition: A | Discharge status: Home / Self Care | Payer: Medicaid Other | Source: Ambulatory Visit | Attending: Physician Assistant | Admitting: Physician Assistant

## 2022-05-06 ENCOUNTER — Encounter: Payer: Self-pay | Admitting: Physician Assistant

## 2022-05-06 VITALS — BP 123/82 | HR 99 | Wt 227.2 lb

## 2022-05-06 DIAGNOSIS — Z794 Long term (current) use of insulin: Secondary | ICD-10-CM

## 2022-05-06 DIAGNOSIS — R195 Other fecal abnormalities: Secondary | ICD-10-CM

## 2022-05-06 DIAGNOSIS — Z113 Encounter for screening for infections with a predominantly sexual mode of transmission: Secondary | ICD-10-CM | POA: Insufficient documentation

## 2022-05-06 DIAGNOSIS — E1142 Type 2 diabetes mellitus with diabetic polyneuropathy: Secondary | ICD-10-CM

## 2022-05-06 DIAGNOSIS — N5314 Retrograde ejaculation: Secondary | ICD-10-CM

## 2022-05-06 DIAGNOSIS — Z1331 Encounter for screening for depression: Secondary | ICD-10-CM

## 2022-05-06 LAB — GLUCOSE, POCT (MANUAL RESULT ENTRY): POC Glucose: 288 mg/dl — AB (ref 70–99)

## 2022-05-06 NOTE — Progress Notes (Signed)
Patient ID: Ernest Mccann, male   DOB: Aug 21, 1991, 31 y.o.   MRN: [id number]   Ernest Mccann, is a 31 y.o. male  HKV:[id number]  FIE:[id number]  DOB - 17-May-1991  No chief complaint on file.      Subjective:   Ernest Mccann is a 31 y.o. male here today for several issues.  He has been having stomach issues for 1-2 months.  Also occurred last summer.  Some fecal incontinence overnight.  Lots of burping with stomach acid.  Immodium/pepto help.  No melena or hematochezia.  No abdominal pain other than cramping prior to fecal urgency.  Seems to be worsened with greasy and fatty foods.  Does not follow diabetic diet.  Ate chocolate and a big mac PTA.  Some solid brown stools/some loose/ sometimes mucus in stool.  No fevers.  Not on well water.  No travel. Not every day.  No FH inflammatory bowel disease.  Other than hyperglycemia, CMP unremarkable 2 months ago.    Still having trouble with retrograde ejaculation but wants to wait on referral until OC or cone discount.  We discussed his +PHQ9 #9=1.  Says only fleeting thoughts.,  no plan or intent.  He says this has improved on medication and with therapy.  He has a gf/new relationship and friendships that are protective factors.   Wants to get STI screening bc in new relationship   No problems updated.  ALLERGIES: No Known Allergies  PAST MEDICAL HISTORY: Past Medical History:  Diagnosis Date   Depression    Diabetes mellitus without complication ([place])    Diabetes mellitus without complication ([place])    Type II    MEDICATIONS AT HOME: Prior to Admission medications   Medication Sig Start Date End Date Taking? Authorizing Provider  albuterol (PROVENTIL HFA;VENTOLIN HFA) 108 (90 BASE) MCG/ACT inhaler Inhale 2 puffs into the lungs every 6 (six) hours as needed for wheezing or shortness of breath. 01/30/15   Tresa Garter, MD  azithromycin (ZITHROMAX) 250 MG tablet Take 2 tabs by mouth on day 1, then take 1 tab by mouth  once daily Patient not taking: Reported on 03/02/2022 01/15/22   Mayers, Cari S, PA-C  benzonatate (TESSALON) 100 MG capsule Take 2 capsules (200 mg total) by mouth 2 (two) times daily as needed for cough. Patient not taking: Reported on 03/02/2022 01/15/22   Mayers, Cari S, PA-C  busPIRone (BUSPAR) 30 MG tablet Take 1 tablet (30 mg total) by mouth daily. 03/17/22 03/17/23  Nwoko, Terese Door, PA  Dulaglutide (TRULICITY) 1.5 ZY/6.0YT SOPN Inject 1.5 mg into the skin once a week. 04/02/22   Charlott Rakes, MD  FLUoxetine (PROZAC) 20 MG capsule Take 3 capsules (60 mg total) by mouth daily. 03/17/22 03/17/23  Nwoko, Terese Door, PA  gabapentin (NEURONTIN) 300 MG capsule Take 1 capsule (300 mg total) by mouth at bedtime. 03/02/22   Charlott Rakes, MD  Insulin Glargine (BASAGLAR KWIKPEN) 100 UNIT/ML Inject 36 Units into the skin daily. 05/04/22   Charlott Rakes, MD  Insulin Pen Needle 31G X 5 MM MISC Use 1 pen needle each daily at bedtime. 01/30/22   Charlott Rakes, MD  lisinopril (ZESTRIL) 10 MG tablet Take 1 tablet (10 mg total) by mouth daily. 03/02/22   Charlott Rakes, MD  polyethylene glycol powder (GLYCOLAX/MIRALAX) 17 GM/SCOOP powder Take 17 g by mouth daily. 09/17/21   Charlott Rakes, MD  Selenium Sulfide 2.25 % SHAM Use 2 (two) times a week. 03/02/22   Charlott Rakes, MD  sildenafil (  VIAGRA) 50 MG tablet Take 1 tablet (50 mg total) by mouth daily as needed for erectile dysfunction. At least 24 hours between doses 04/02/22   Charlott Rakes, MD  traZODone (DESYREL) 50 MG tablet Take 1 tablet (50 mg total) by mouth at bedtime. 03/17/22   Nwoko, Isidoro Donning E, PA    ROS: Neg HEENT Neg resp Neg cardiac Neg MS Neg psych Neg neuro  Objective:   Vitals:   05/06/22 1542  BP: 123/82  Pulse: 99  SpO2: 97%  Weight: 227 lb 3.2 oz (103.1 kg)   Exam General appearance : Awake, alert, not in any distress. Speech Clear. Not toxic looking HEENT: Atraumatic and Normocephalic  Chest: Good air entry bilaterally, CTAB.   No rales/rhonchi/wheezing CVS: S1 S2 regular, no murmurs.  Abdomen: Bowel sounds present, Non tender and not distended with no gaurding, rigidity or rebound. Extremities: B/L Lower Ext shows no edema, both legs are warm to touch Neurology: Awake alert, and oriented X 3, CN II-XII intact, Non focal Skin: No Rash  Data Review Lab Results  Component Value Date   HGBA1C 13.6 (A) 03/02/2022   HGBA1C 13.4 (A) 09/17/2021   HGBA1C 12.50 01/08/2016    Assessment & Plan   1. Type 2 diabetes mellitus with diabetic polyneuropathy, with long-term current use of insulin (HCC) Uncontrolled but plan was titrated 2 days ago and blood sugars improving.  Continue current regimen/titration plan.  I have had a lengthy discussion and provided education about insulin resistance and the intake of too much sugar/refined carbohydrates.  I have advised the patient to work at a goal of eliminating sugary drinks, candy, desserts, sweets, refined sugars, processed foods, and white carbohydrates.  The patient expresses understanding.  ?some level of gastroparesis. H. Pylori neg about 8 months ago - Glucose (CBG)  2. Loose stools - Ambulatory referral to Gastroenterology  3. Ejaculation, retrograde Will refer once he has financial assistance-he wishes to defer for now  4. Routine screening for STI (sexually transmitted infection) Safe sex practices reviewed - Urine cytology ancillary only - HIV antibody (with reflex) - RPR w/reflex to TrepSure  5. Mucus in stool - Ambulatory referral to Gastroenterology  6. Positive depression screening Fleeting thoughts.  On medication and under care of psychiatry and counselor.  Overall he is doing well/"better".  Denies any current SI/HI    Return for keep 6/20 appt with Dr Margarita Rana.  The patient was given clear instructions to go to ER or return to medical center if symptoms don't improve, worsen or new problems develop. The patient verbalized understanding. The  patient was told to call to get lab results if they haven't heard anything in the next week.      Freeman Caldron, PA-C [facility] and [place], [place]   05/06/2022, 3:56 PM

## 2022-05-07 LAB — RPR W/REFLEX TO TREPSURE: RPR: NONREACTIVE

## 2022-05-07 LAB — HIV ANTIBODY (ROUTINE TESTING W REFLEX): HIV Screen 4th Generation wRfx: NONREACTIVE

## 2022-05-07 LAB — T PALLIDUM ANTIBODY, EIA: T pallidum Antibody, EIA: NEGATIVE

## 2022-05-08 LAB — URINE CYTOLOGY ANCILLARY ONLY
Chlamydia: NEGATIVE
Comment: NEGATIVE
Comment: NEGATIVE
Comment: NORMAL
Neisseria Gonorrhea: NEGATIVE
Trichomonas: NEGATIVE

## 2022-05-19 ENCOUNTER — Other Ambulatory Visit: Payer: Self-pay

## 2022-05-19 ENCOUNTER — Telehealth (INDEPENDENT_AMBULATORY_CARE_PROVIDER_SITE_OTHER): Payer: No Payment, Other | Admitting: Physician Assistant

## 2022-05-19 DIAGNOSIS — F4310 Post-traumatic stress disorder, unspecified: Secondary | ICD-10-CM

## 2022-05-19 DIAGNOSIS — F411 Generalized anxiety disorder: Secondary | ICD-10-CM

## 2022-05-19 DIAGNOSIS — F333 Major depressive disorder, recurrent, severe with psychotic symptoms: Secondary | ICD-10-CM | POA: Diagnosis not present

## 2022-05-19 MED ORDER — BUSPIRONE HCL 15 MG PO TABS
15.0000 mg | ORAL_TABLET | Freq: Every day | ORAL | 2 refills | Status: AC
  Filled 2022-05-19: qty 30, 30d supply, fill #0

## 2022-05-19 MED ORDER — FLUOXETINE HCL 40 MG PO CAPS
40.0000 mg | ORAL_CAPSULE | Freq: Every day | ORAL | 2 refills | Status: DC
  Filled 2022-05-19: qty 30, 30d supply, fill #0

## 2022-05-19 NOTE — Progress Notes (Cosign Needed)
BH MD/PA/NP OP Progress Note  Virtual Visit via Video Note  I connected with Ernest Mccann on 05/19/22 at  8:30 AM EDT by a video enabled telemedicine application and verified that I am speaking with the correct person using two identifiers.  Location: Patient: Home Provider: Clinic   I discussed the limitations of evaluation and management by telemedicine and the availability of in person appointments. The patient expressed understanding and agreed to proceed.  Follow Up Instructions:  I discussed the assessment and treatment plan with the patient. The patient was provided an opportunity to ask questions and all were answered. The patient agreed with the plan and demonstrated an understanding of the instructions.   The patient was advised to call back or seek an in-person evaluation if the symptoms worsen or if the condition fails to improve as anticipated.  I provided 14 minutes of non-face-to-face time during this encounter.  Ernest Hatchet, PA    05/19/2022 12:20 PM Ernest Mccann  MRN:  [id number]  Chief Complaint:  Chief Complaint  Patient presents with   Follow-up   Medication Management   HPI:   Ernest Mccann is a 31 year old male with a past psychiatric history significant for major depressive disorder with psychotic features, generalized anxiety disorder, and PTSD who presents to [facility] via virtual video visit for follow-up and medication management.  Patient is currently being managed on the following medications:  Buspirone 30 mg daily Trazodone 50 mg at bedtime Fluoxetine 40 mg daily  Patient states that he has not been taking his medications regularly.  Although patient's medication compliant has been fragmented, he endorses feeling better.  He reports taking his medications 3 times a week on average.  His response to why he has been taking his medications less is that he experienced feeling more down  and suicidal ideations when he took his medication regimen as prescribed.  He reports that his depressive symptoms are only apparent when triggered by a significant event such as family issues.  Patient's depressive episodes are mainly defined by suicidal ideations.  Patient reports that his anxiety has been more improved.  He expresses that he is currently having "good anxiety" due to an upcoming job interview he has scheduled for 11:00 AM.  He reports that he is excited about the interview because he really likes the position that he is applying for.  Patient still endorses stressors related to his wife suing him for full custody of their daughter, financial instability, struggling to find a Clinical research associate, and his name being run through the mud regarding how good of a father he is.  A PHQ-9 screen was performed with the patient scoring a 12.  A GAD-7 screen was also performed with the patient scoring a 17. A [place] Suicide Severity Rating Scale was performed with the patient being considered moderate risk.  Patient is alert and oriented x4, pleasant, calm, cooperative, and fully engaged in conversation during the encounter.  Patient endorses feeling pretty good.  He denies suicidal or homicidal ideations.  He further denies auditory or visual hallucinations and does not appear to be responding to internal/external stimuli.  Patient endorses good sleep stating that he receives a full night rest on most occasions.  Patient states that he still engages and stress eating especially when depressed.  Patient eats on average 3 meals or more per day.  Patient denies alcohol consumption, tobacco use, and illicit drug use.  Visit Diagnosis:    ICD-10-CM  1. GAD (generalized anxiety disorder)  F41.1 busPIRone (BUSPAR) 15 MG tablet    FLUoxetine (PROZAC) 40 MG capsule    2. Severe episode of recurrent major depressive disorder, with psychotic features (HCC)  F33.3 FLUoxetine (PROZAC) 40 MG capsule    3. PTSD  (post-traumatic stress disorder)  F43.10 FLUoxetine (PROZAC) 40 MG capsule      Past Psychiatric History:  Major depressive disorder PTSD Generalized anxiety disorder  Past Medical History:  Past Medical History:  Diagnosis Date   Depression    Diabetes mellitus without complication (HCC)    Diabetes mellitus without complication (HCC)    Type II    Past Surgical History:  Procedure Laterality Date   KNEE ARTHROPLASTY     TONSILLECTOMY      Family Psychiatric History:  Father - Anxiety, depression, and PTSD (military history) Mother - Depression and anxiety Grandfather (maternal) - Depression and PTSD Nature conservation officer) Patient states that a number of his uncles and aunts have depression  Family History:  Family History  Problem Relation Age of Onset   Diabetes Mother    Diabetes Father    Heart disease Father    Renal Disease Father    Cancer Maternal Aunt    Heart disease Maternal Grandfather     Social History:  Social History   Socioeconomic History   Marital status: Married    Spouse name: Not on file   Number of children: Not on file   Years of education: Not on file   Highest education level: Not on file  Occupational History   Not on file  Tobacco Use   Smoking status: Never   Smokeless tobacco: Never  Substance and Sexual Activity   Alcohol use: Yes    Comment: socially   Drug use: Yes    Comment: THC   Sexual activity: Yes  Other Topics Concern   Not on file  Social History Narrative   ** Merged History Encounter **       Social Determinants of Health   Financial Resource Strain: High Risk   Difficulty of Paying Living Expenses: Very hard  Food Insecurity: Geophysicist/field seismologist Present   Worried About Programme researcher, broadcasting/film/video in the Last Year: Often true   Barista in the Last Year: Often true  Transportation Needs: Personal assistant (Medical): No   Lack of Transportation (Non-Medical): Yes  Physical Activity:  Sufficiently Active   Days of Exercise per Week: 4 days   Minutes of Exercise per Session: 60 min  Stress: Stress Concern Present   Feeling of Stress : Rather much  Social Connections: Moderately Isolated   Frequency of Communication with Friends and Family: More than three times a week   Frequency of Social Gatherings with Friends and Family: Twice a week   Attends Religious Services: 1 to 4 times per year   Active Member of [employer] or Organizations: No   Attends Banker Meetings: Never   Marital Status: Separated    Allergies: No Known Allergies  Metabolic Disorder Labs: Lab Results  Component Value Date   HGBA1C 13.6 (A) 03/02/2022   No results found for: PROLACTIN Lab Results  Component Value Date   CHOL 197 03/02/2022   TRIG 183 (H) 03/02/2022   HDL 35 (L) 03/02/2022   CHOLHDL 6.8 (H) 09/19/2021   VLDL 41 (H) 08/15/2014   LDLCALC 129 (H) 03/02/2022   LDLCALC 98 09/19/2021   Lab Results  Component Value  Date   TSH 3.426 08/15/2014   TSH 3.613 Test methodology is 3rd generation TSH 12/20/2009    Therapeutic Level Labs: No results found for: LITHIUM No results found for: VALPROATE No components found for:  CBMZ  Current Medications: Current Outpatient Medications  Medication Sig Dispense Refill   albuterol (PROVENTIL HFA;VENTOLIN HFA) 108 (90 BASE) MCG/ACT inhaler Inhale 2 puffs into the lungs every 6 (six) hours as needed for wheezing or shortness of breath. 3 Inhaler 3   azithromycin (ZITHROMAX) 250 MG tablet Take 2 tabs by mouth on day 1, then take 1 tab by mouth once daily (Patient not taking: Reported on 03/02/2022) 6 tablet 0   benzonatate (TESSALON) 100 MG capsule Take 2 capsules (200 mg total) by mouth 2 (two) times daily as needed for cough. (Patient not taking: Reported on 03/02/2022) 40 capsule 0   busPIRone (BUSPAR) 15 MG tablet Take 1 tablet (15 mg total) by mouth daily. 30 tablet 2   Dulaglutide (TRULICITY) 1.5 MG/0.5ML SOPN Inject 1.5 mg  into the skin once a week. 2 mL 2   FLUoxetine (PROZAC) 40 MG capsule Take 1 capsule (40 mg total) by mouth daily. 30 capsule 2   gabapentin (NEURONTIN) 300 MG capsule Take 1 capsule (300 mg total) by mouth at bedtime. 30 capsule 6   Insulin Glargine (BASAGLAR KWIKPEN) 100 UNIT/ML Inject 36 Units into the skin daily. 12 mL 2   Insulin Pen Needle 31G X 5 MM MISC Use 1 pen needle each daily at bedtime. 30 each 5   lisinopril (ZESTRIL) 10 MG tablet Take 1 tablet (10 mg total) by mouth daily. 30 tablet 6   polyethylene glycol powder (GLYCOLAX/MIRALAX) 17 GM/SCOOP powder Take 17 g by mouth daily. 3350 g 1   Selenium Sulfide 2.25 % SHAM Use 2 (two) times a week. 180 mL 1   sildenafil (VIAGRA) 50 MG tablet Take 1 tablet (50 mg total) by mouth daily as needed for erectile dysfunction. At least 24 hours between doses 10 tablet 1   traZODone (DESYREL) 50 MG tablet Take 1 tablet (50 mg total) by mouth at bedtime. 30 tablet 1   No current facility-administered medications for this visit.     Musculoskeletal: Strength & Muscle Tone: within normal limits Gait & Station: normal Patient leans: N/A  Psychiatric Specialty Exam: Review of Systems  Psychiatric/Behavioral:  Positive for sleep disturbance. Negative for decreased concentration, dysphoric mood, hallucinations, self-injury and suicidal ideas. The patient is nervous/anxious. The patient is not hyperactive.    There were no vitals taken for this visit.There is no height or weight on file to calculate BMI.  General Appearance: Casual  Eye Contact:  Good  Speech:  Clear and Coherent and Normal Rate  Volume:  Normal  Mood:  Anxious and Depressed  Affect:  Congruent and Depressed  Thought Process:  Coherent, Goal Directed, and Descriptions of Associations: Intact  Orientation:  Full (Time, Place, and Person)  Thought Content: WDL   Suicidal Thoughts:  No  Homicidal Thoughts:  No  Memory:  Immediate;   Good Recent;   Good Remote;   Good   Judgement:  Fair  Insight:  Fair  Psychomotor Activity:  Normal  Concentration:  Concentration: Fair and Attention Span: Fair  Recall:  Good  Fund of Knowledge: Good  Language: Good  Akathisia:  No  Handed:  Right  AIMS (if indicated): not done  Assets:  Communication Skills Desire for Improvement Vocational/Educational  ADL's:  Intact  Cognition: WNL  Sleep:  Fair   Screenings: GAD-7    Flowsheet Row Video Visit from 05/19/2022 in [facility] Office Visit from 05/06/2022 in [facility] Counselor from 05/01/2022 in [facility] Counselor from 04/10/2022 in [facility] Counselor from 03/20/2022 in [facility]  Total GAD-7 Score PHQ2-9    Flowsheet Row Video Visit from 05/19/2022 in [facility] Office Visit from 05/06/2022 in [facility] Counselor from 05/01/2022 in [facility] Counselor from 04/10/2022 in [facility] Counselor from 03/20/2022 in [facility]  PHQ-2 Total Score PHQ-9 Total Score Flowsheet Row Video Visit from 05/19/2022 in [facility] Counselor from 04/10/2022 in [facility] Counselor from 03/20/2022 in [facility]  C-SSRS RISK CATEGORY Moderate Risk Moderate Risk High Risk        Assessment and Plan:   Ernest Mccann is a 31 year old male with a past psychiatric history significant for major depressive disorder with psychotic features, generalized anxiety disorder, and PTSD who presents to [facility] via virtual video visit for follow-up and medication management.  Patient notes that he has been taking his  medications less frequently due to experiencing side effects related to low mood and suicidal ideations.  He reported that his anxiety has been more manageable and is preparing for an interview for a job that he really wants.  Patient still continues to to endorse some stressors related to an ongoing custody battle between him and his ex-wife.  Patient was recommended adjusting his dosage of paroxetine from 60 mg to 40 mg daily and his dosage of buspirone from 30 mg to 15 mg daily in favor of taking his medications more regularly.  Patient was agreeable to recommendations.  Patient's medications to be e-prescribed to pharmacy of choice.  Collaboration of Care: Collaboration of Care: Medication Management AEB provider managing patient's psychiatric medications, Primary Care Provider AEB patient being followed by her primary care provider, Psychiatrist AEB patient being followed by mental health provider, and Referral or follow-up with counselor/therapist AEB patient being seen by a licensed clinical social worker at this facility  Patient/Guardian was advised Release of Information must be obtained prior to any record release in order to collaborate their care with an outside provider. Patient/Guardian was advised if they have not already done so to contact the registration department to sign all necessary forms in order for [place] to release information regarding their care.   Consent: Patient/Guardian gives verbal consent for treatment and assignment of benefits for services provided during this visit. Patient/Guardian expressed understanding and agreed to proceed.   1. GAD (generalized anxiety disorder)  - busPIRone (BUSPAR) 15 MG tablet; Take 1 tablet (15 mg total) by mouth daily.  Dispense: 30 tablet; Refill: 2 - FLUoxetine (PROZAC) 40 MG capsule; Take 1 capsule (40 mg total) by mouth daily.  Dispense: 30 capsule; Refill: 2  2. Severe episode of recurrent major depressive disorder, with psychotic  features (HCC)  - FLUoxetine (PROZAC) 40 MG capsule; Take 1 capsule (40 mg total) by mouth daily.  Dispense: 30 capsule; Refill: 2  3. PTSD (post-traumatic stress disorder)  - FLUoxetine (PROZAC) 40 MG capsule; Take  1 capsule (40 mg total) by mouth daily.  Dispense: 30 capsule; Refill: 2  Patient to follow up in  3 months Provider spent a total of 14 minutes with the patient/reviewing the patient's chart  Ernest Hatchet, PA 05/19/2022, 12:20 PM

## 2022-05-22 ENCOUNTER — Encounter (HOSPITAL_COMMUNITY): Payer: Self-pay | Admitting: Physician Assistant

## 2022-05-22 ENCOUNTER — Other Ambulatory Visit: Payer: Self-pay

## 2022-05-26 ENCOUNTER — Other Ambulatory Visit: Payer: Self-pay

## 2022-06-02 ENCOUNTER — Ambulatory Visit: Payer: Medicaid Other | Attending: Family Medicine | Admitting: Family Medicine

## 2022-06-02 ENCOUNTER — Encounter: Payer: Self-pay | Admitting: Family Medicine

## 2022-06-02 ENCOUNTER — Other Ambulatory Visit: Payer: Self-pay

## 2022-06-02 VITALS — BP 135/88 | HR 98 | Temp 98.3°F | Ht 72.0 in | Wt 230.2 lb

## 2022-06-02 DIAGNOSIS — Z833 Family history of diabetes mellitus: Secondary | ICD-10-CM | POA: Insufficient documentation

## 2022-06-02 DIAGNOSIS — F32A Depression, unspecified: Secondary | ICD-10-CM | POA: Insufficient documentation

## 2022-06-02 DIAGNOSIS — Z5986 Financial insecurity: Secondary | ICD-10-CM | POA: Insufficient documentation

## 2022-06-02 DIAGNOSIS — B349 Viral infection, unspecified: Secondary | ICD-10-CM

## 2022-06-02 DIAGNOSIS — Z79899 Other long term (current) drug therapy: Secondary | ICD-10-CM | POA: Insufficient documentation

## 2022-06-02 DIAGNOSIS — Z7985 Long-term (current) use of injectable non-insulin antidiabetic drugs: Secondary | ICD-10-CM | POA: Insufficient documentation

## 2022-06-02 DIAGNOSIS — E1165 Type 2 diabetes mellitus with hyperglycemia: Secondary | ICD-10-CM | POA: Insufficient documentation

## 2022-06-02 DIAGNOSIS — Z794 Long term (current) use of insulin: Secondary | ICD-10-CM

## 2022-06-02 DIAGNOSIS — I10 Essential (primary) hypertension: Secondary | ICD-10-CM | POA: Insufficient documentation

## 2022-06-02 DIAGNOSIS — E1142 Type 2 diabetes mellitus with diabetic polyneuropathy: Secondary | ICD-10-CM

## 2022-06-02 DIAGNOSIS — Z8249 Family history of ischemic heart disease and other diseases of the circulatory system: Secondary | ICD-10-CM | POA: Insufficient documentation

## 2022-06-02 DIAGNOSIS — F419 Anxiety disorder, unspecified: Secondary | ICD-10-CM | POA: Insufficient documentation

## 2022-06-02 DIAGNOSIS — B9789 Other viral agents as the cause of diseases classified elsewhere: Secondary | ICD-10-CM | POA: Insufficient documentation

## 2022-06-02 DIAGNOSIS — N529 Male erectile dysfunction, unspecified: Secondary | ICD-10-CM

## 2022-06-02 LAB — GLUCOSE, POCT (MANUAL RESULT ENTRY)
POC Glucose: 439 mg/dl — AB (ref 70–99)
POC Glucose: 384 mg/dl — AB (ref 70–99)

## 2022-06-02 LAB — POCT GLYCOSYLATED HEMOGLOBIN (HGB A1C): HbA1c, POC (controlled diabetic range): 11.3 % — AB (ref 0.0–7.0)

## 2022-06-02 LAB — POCT URINALYSIS DIP (CLINITEK)
Bilirubin, UA: NEGATIVE
Blood, UA: NEGATIVE
Glucose, UA: 500 mg/dL — AB
Ketones, POC UA: NEGATIVE mg/dL
Leukocytes, UA: NEGATIVE
Nitrite, UA: NEGATIVE
POC PROTEIN,UA: NEGATIVE
Spec Grav, UA: 1.01 (ref 1.010–1.025)
Urobilinogen, UA: 0.2 E.U./dL
pH, UA: 5.5 (ref 5.0–8.0)

## 2022-06-02 MED ORDER — SILDENAFIL CITRATE 100 MG PO TABS
100.0000 mg | ORAL_TABLET | Freq: Every day | ORAL | 1 refills | Status: DC | PRN
  Filled 2022-06-02 – 2022-06-10 (×2): qty 10, 30d supply, fill #0
  Filled 2022-06-21: qty 10, 30d supply, fill #1

## 2022-06-02 MED ORDER — INSULIN ASPART 100 UNIT/ML IJ SOLN
10.0000 [IU] | Freq: Once | INTRAMUSCULAR | Status: AC
  Administered 2022-06-02: 10 [IU] via SUBCUTANEOUS

## 2022-06-02 MED ORDER — SEMAGLUTIDE (1 MG/DOSE) 4 MG/3ML ~~LOC~~ SOPN
1.0000 mg | PEN_INJECTOR | SUBCUTANEOUS | 0 refills | Status: DC
  Filled 2022-06-02 – 2022-06-21 (×2): qty 3, 28d supply, fill #0

## 2022-06-02 MED ORDER — SEMAGLUTIDE (2 MG/DOSE) 8 MG/3ML ~~LOC~~ SOPN
2.0000 mg | PEN_INJECTOR | SUBCUTANEOUS | 6 refills | Status: DC
  Filled 2022-06-02 – 2022-06-21 (×2): qty 3, fill #0
  Filled 2022-07-21: qty 3, 28d supply, fill #0

## 2022-06-02 NOTE — Patient Instructions (Signed)
Semaglutide Injection What is this medication? SEMAGLUTIDE (SEM a GLOO tide) treats type 2 diabetes. It works by increasing insulin levels in your body, which decreases your blood sugar (glucose). It also reduces the amount of sugar released into the blood and slows down your digestion. It can also be used to lower the risk of heart attack and stroke in people with type 2 diabetes. Changes to diet and exercise are often combined with this medication. This medicine may be used for other purposes; ask your health care provider or pharmacist if you have questions. COMMON BRAND NAME(S): OZEMPIC What should I tell my care team before I take this medication? They need to know if you have any of these conditions: Endocrine tumors (MEN 2) or if someone in your family had these tumors Eye disease, vision problems History of pancreatitis Kidney disease Stomach problems Thyroid cancer or if someone in your family had thyroid cancer An unusual or allergic reaction to semaglutide, other medications, foods, dyes, or preservatives Pregnant or trying to get pregnant Breast-feeding How should I use this medication? This medication is for injection under the skin of your upper leg (thigh), stomach area, or upper arm. It is given once every week (every 7 days). You will be taught how to prepare and give this medication. Use exactly as directed. Take your medication at regular intervals. Do not take it more often than directed. If you use this medication with insulin, you should inject this medication and the insulin separately. Do not mix them together. Do not give the injections right next to each other. Change (rotate) injection sites with each injection. It is important that you put your used needles and syringes in a special sharps container. Do not put them in a trash can. If you do not have a sharps container, call your pharmacist or care team to get one. A special MedGuide will be given to you by the  pharmacist with each prescription and refill. Be sure to read this information carefully each time. This medication comes with INSTRUCTIONS FOR USE. Ask your pharmacist for directions on how to use this medication. Read the information carefully. Talk to your pharmacist or care team if you have questions. Talk to your care team about the use of this medication in children. Special care may be needed. Overdosage: If you think you have taken too much of this medicine contact a poison control center or emergency room at once. NOTE: This medicine is only for you. Do not share this medicine with others. What if I miss a dose? If you miss a dose, take it as soon as you can within 5 days after the missed dose. Then take your next dose at your regular weekly time. If it has been longer than 5 days after the missed dose, do not take the missed dose. Take the next dose at your regular time. Do not take double or extra doses. If you have questions about a missed dose, contact your care team for advice. What may interact with this medication? Other medications for diabetes Many medications may cause changes in blood sugar, these include: Alcohol containing beverages Antiviral medications for HIV or AIDS Aspirin and aspirin-like medications Certain medications for blood pressure, heart disease, irregular heart beat Chromium Diuretics Male hormones, such as estrogens or progestins, birth control pills Fenofibrate Gemfibrozil Isoniazid Lanreotide Male hormones or anabolic steroids MAOIs like Carbex, Eldepryl, Marplan, Nardil, and Parnate Medications for weight loss Medications for allergies, asthma, cold, or cough Medications for depression,   anxiety, or psychotic disturbances Niacin Nicotine NSAIDs, medications for pain and inflammation, like ibuprofen or naproxen Octreotide Pasireotide Pentamidine Phenytoin Probenecid Quinolone antibiotics such as ciprofloxacin, levofloxacin, ofloxacin Some  herbal dietary supplements Steroid medications such as prednisone or cortisone Sulfamethoxazole; trimethoprim Thyroid hormones Some medications can hide the warning symptoms of low blood sugar (hypoglycemia). You may need to monitor your blood sugar more closely if you are taking one of these medications. These include: Beta-blockers, often used for high blood pressure or heart problems (examples include atenolol, metoprolol, propranolol) Clonidine Guanethidine Reserpine This list may not describe all possible interactions. Give your health care provider a list of all the medicines, herbs, non-prescription drugs, or dietary supplements you use. Also tell them if you smoke, drink alcohol, or use illegal drugs. Some items may interact with your medicine. What should I watch for while using this medication? Visit your care team for regular checks on your progress. Drink plenty of fluids while taking this medication. Check with your care team if you get an attack of severe diarrhea, nausea, and vomiting. The loss of too much body fluid can make it dangerous for you to take this medication. A test called the HbA1C (A1C) will be monitored. This is a simple blood test. It measures your blood sugar control over the last 2 to 3 months. You will receive this test every 3 to 6 months. Learn how to check your blood sugar. Learn the symptoms of low and high blood sugar and how to manage them. Always carry a quick-source of sugar with you in case you have symptoms of low blood sugar. Examples include hard sugar candy or glucose tablets. Make sure others know that you can choke if you eat or drink when you develop serious symptoms of low blood sugar, such as seizures or unconsciousness. They must get medical help at once. Tell your care team if you have high blood sugar. You might need to change the dose of your medication. If you are sick or exercising more than usual, you might need to change the dose of your  medication. Do not skip meals. Ask your care team if you should avoid alcohol. Many nonprescription cough and cold products contain sugar or alcohol. These can affect blood sugar. Pens should never be shared. Even if the needle is changed, sharing may result in passing of viruses like hepatitis or HIV. Wear a medical ID bracelet or chain, and carry a card that describes your disease and details of your medication and dosage times. Do not become pregnant while taking this medication. Women should inform their care team if they wish to become pregnant or think they might be pregnant. There is a potential for serious side effects to an unborn child. Talk to your care team for more information. What side effects may I notice from receiving this medication? Side effects that you should report to your care team as soon as possible: Allergic reactions--skin rash, itching, hives, swelling of the face, lips, tongue, or throat Change in vision Dehydration--increased thirst, dry mouth, feeling faint or lightheaded, headache, dark yellow or brown urine Gallbladder problems--severe stomach pain, nausea, vomiting, fever Heart palpitations--rapid, pounding, or irregular heartbeat Kidney injury--decrease in the amount of urine, swelling of the ankles, hands, or feet Pancreatitis--severe stomach pain that spreads to your back or gets worse after eating or when touched, fever, nausea, vomiting Thyroid cancer--new mass or lump in the neck, pain or trouble swallowing, trouble breathing, hoarseness Side effects that usually do not require medical   attention (report to your care team if they continue or are bothersome): Diarrhea Loss of appetite Nausea Stomach pain Vomiting This list may not describe all possible side effects. Call your doctor for medical advice about side effects. You may report side effects to FDA at 1-800-FDA-1088. Where should I keep my medication? Keep out of the reach of children. Store  unopened pens in a refrigerator between 2 and 8 degrees C (36 and 46 degrees F). Do not freeze. Protect from light and heat. After you first use the pen, it can be stored for 56 days at room temperature between 15 and 30 degrees C (59 and 86 degrees F) or in a refrigerator. Throw away your used pen after 56 days or after the expiration date, whichever comes first. Do not store your pen with the needle attached. If the needle is left on, medication may leak from the pen. NOTE: This sheet is a summary. It may not cover all possible information. If you have questions about this medicine, talk to your doctor, pharmacist, or health care provider.  2023 Elsevier/Gold Standard (2021-03-06 00:00:00)  

## 2022-06-02 NOTE — Progress Notes (Signed)
Med concerns  Chest congestion and coughs

## 2022-06-02 NOTE — Progress Notes (Unsigned)
Subjective:  Patient ID: Ernest Mccann, male    DOB: Sep 11, 1991  Age: 31 y.o. MRN: [id number]  CC: Diabetes   HPI Ernest Mccann is a 31 y.o. year old male with a history of type 2 diabetes mellitus (A1c 13.6), anxiety and depression, hypertension here for follow-up of chronic medical conditions.  Interval History: He complains of chest congestion and cough with greenish mucus x1 week. He has not used any OTC medications. He has had some dyspnea and wheezing but no chest pain. He has no facial pain. He had sick contacts as his Girlfriend's kids had similar symptoms.   At his last visit his blood pressure was uncontrolled and lisinopril dose was increased  He was placed on Sildenafil at his last visit which he states helps him achieve an erection but he has difficulty maintaining an erection  He has not taken any of his medications today as he was in a rush here. Past Medical History:  Diagnosis Date   Depression    Diabetes mellitus without complication (HCC)    Diabetes mellitus without complication (HCC)    Type II    Past Surgical History:  Procedure Laterality Date   KNEE ARTHROPLASTY     TONSILLECTOMY      Family History  Problem Relation Age of Onset   Diabetes Mother    Diabetes Father    Heart disease Father    Renal Disease Father    Cancer Maternal Aunt    Heart disease Maternal Grandfather     Social History   Socioeconomic History   Marital status: Married    Spouse name: Not on file   Number of children: Not on file   Years of education: Not on file   Highest education level: Not on file  Occupational History   Not on file  Tobacco Use   Smoking status: Never   Smokeless tobacco: Never  Substance and Sexual Activity   Alcohol use: Yes    Comment: socially   Drug use: Yes    Comment: THC   Sexual activity: Yes  Other Topics Concern   Not on file  Social History Narrative   ** Merged History Encounter **       Social  Determinants of Health   Financial Resource Strain: High Risk (11/12/2021)   Overall Financial Resource Strain (CARDIA)    Difficulty of Paying Living Expenses: Very hard  Food Insecurity: Food Insecurity Present (11/12/2021)   Hunger Vital Sign    Worried About Running Out of Food in the Last Year: Often true    Ran Out of Food in the Last Year: Often true  Transportation Needs: Unmet Transportation Needs (11/12/2021)   PRAPARE - Administrator, Civil Service (Medical): No    Lack of Transportation (Non-Medical): Yes  Physical Activity: Sufficiently Active (11/12/2021)   Exercise Vital Sign    Days of Exercise per Week: 4 days    Minutes of Exercise per Session: 60 min  Stress: Stress Concern Present (11/12/2021)   [employer] of Occupational Health - Occupational Stress Questionnaire    Feeling of Stress : Rather much  Social Connections: Moderately Isolated (11/12/2021)   Social Connection and Isolation Panel [NHANES]    Frequency of Communication with Friends and Family: More than three times a week    Frequency of Social Gatherings with Friends and Family: Twice a week    Attends Religious Services: 1 to 4 times per year    Active Member  of Clubs or Organizations: No    Attends Banker Meetings: Never    Marital Status: Separated    No Known Allergies  Outpatient Medications Prior to Visit  Medication Sig Dispense Refill   albuterol (PROVENTIL HFA;VENTOLIN HFA) 108 (90 BASE) MCG/ACT inhaler Inhale 2 puffs into the lungs every 6 (six) hours as needed for wheezing or shortness of breath. 3 Inhaler 3   azithromycin (ZITHROMAX) 250 MG tablet Take 2 tabs by mouth on day 1, then take 1 tab by mouth once daily 6 tablet 0   benzonatate (TESSALON) 100 MG capsule Take 2 capsules (200 mg total) by mouth 2 (two) times daily as needed for cough. 40 capsule 0   busPIRone (BUSPAR) 15 MG tablet Take 1 tablet (15 mg total) by mouth daily. 30 tablet 2    Dulaglutide (TRULICITY) 1.5 MG/0.5ML SOPN Inject 1.5 mg into the skin once a week. 2 mL 2   FLUoxetine (PROZAC) 40 MG capsule Take 1 capsule (40 mg total) by mouth daily. 30 capsule 2   gabapentin (NEURONTIN) 300 MG capsule Take 1 capsule (300 mg total) by mouth at bedtime. 30 capsule 6   Insulin Glargine (BASAGLAR KWIKPEN) 100 UNIT/ML Inject 36 Units into the skin daily. 12 mL 2   Insulin Pen Needle 31G X 5 MM MISC Use 1 pen needle each daily at bedtime. 30 each 5   lisinopril (ZESTRIL) 10 MG tablet Take 1 tablet (10 mg total) by mouth daily. 30 tablet 6   polyethylene glycol powder (GLYCOLAX/MIRALAX) 17 GM/SCOOP powder Take 17 g by mouth daily. 3350 g 1   Selenium Sulfide 2.25 % SHAM Use 2 (two) times a week. 180 mL 1   sildenafil (VIAGRA) 50 MG tablet Take 1 tablet (50 mg total) by mouth daily as needed for erectile dysfunction. At least 24 hours between doses 10 tablet 1   traZODone (DESYREL) 50 MG tablet Take 1 tablet (50 mg total) by mouth at bedtime. 30 tablet 1   No facility-administered medications prior to visit.     ROS Review of Systems *** Objective:  BP 135/88   Pulse 96   Temp 98.3 F (36.8 C)   Ht 6' (1.829 m)   Wt 230 lb 3.2 oz (104.4 kg)   SpO2 96%   BMI 31.22 kg/m      06/02/2022    1:40 PM 05/06/2022    3:42 PM 03/02/2022    8:37 AM  BP/Weight  Systolic BP 135 123 158  Diastolic BP 88 82 101  Wt. (Lbs) 230.2 227.2 226.6  BMI 31.22 kg/m2 30.81 kg/m2 30.73 kg/m2      Physical Exam ***    Latest Ref Rng & Units 03/02/2022   10:01 AM 09/19/2021    9:57 AM 04/12/2021    3:34 PM  CMP  Glucose 70 - 99 mg/dL 308  657  846   BUN 6 - 20 mg/dL 11  17  14    Creatinine 0.76 - 1.27 mg/dL  9.62  9.52   Sodium 134 - 144 mmol/L 135  135  134   Potassium 3.5 - 5.2 mmol/L 4.6  4.8  4.0   Chloride 96 - 106 mmol/L 98  97  100   CO2 20 - 29 mmol/L 23  22  25    Calcium 8.7 - 10.2 mg/dL 9.1  9.9  8.7   Total Protein 6.0 - 8.5 g/dL 6.5  6.8    Total Bilirubin 0.0  - 1.2 mg/dL 0.4  0.3    Alkaline Phos 44 - 121 IU/L 114  121    AST 0 - 40 IU/L 12  10    ALT 0 - 44 IU/L 17  19      Lipid Panel     Component Value Date/Time   CHOL 197 03/02/2022 1001   TRIG 183 (H) 03/02/2022 1001   HDL 35 (L) 03/02/2022 1001   CHOLHDL 6.8 (H) 09/19/2021 0957   CHOLHDL 4.2 08/15/2014 1211   VLDL 41 (H) 08/15/2014 1211   LDLCALC 129 (H) 03/02/2022 1001    CBC    Component Value Date/Time   WBC 4.1 04/12/2021 1534   RBC 4.86 04/12/2021 1534   HGB 13.4 04/12/2021 1534   HCT 38.3 (L) 04/12/2021 1534   PLT 294 04/12/2021 1534   MCV 78.8 (L) 04/12/2021 1534   MCH 27.6 04/12/2021 1534   MCHC 35.0 04/12/2021 1534   RDW 12.7 04/12/2021 1534   LYMPHSABS 2.5 07/02/2015 2316   MONOABS 0.4 07/02/2015 2316   EOSABS 0.2 07/02/2015 2316   BASOSABS 0.0 07/02/2015 2316    Lab Results  Component Value Date   HGBA1C 11.3 (A) 06/02/2022    Assessment & Plan:  1. Type 2 diabetes mellitus with diabetic polyneuropathy, with long-term current use of insulin (HCC) *** - POCT glucose (manual entry) - POCT glycosylated hemoglobin (Hb A1C)   Health Care Maintenance: *** No orders of the defined types were placed in this encounter.   Follow-up: No follow-ups on file.       Hoy Register, MD, FAAFP. [facility] [place], [place] [phone]   06/02/2022, 2:01 PM

## 2022-06-03 ENCOUNTER — Other Ambulatory Visit: Payer: Self-pay

## 2022-06-03 LAB — BASIC METABOLIC PANEL
BUN/Creatinine Ratio: 15 (ref 9–20)
BUN: 12 mg/dL (ref 6–20)
CO2: 24 mmol/L (ref 20–29)
Calcium: 9.9 mg/dL (ref 8.7–10.2)
Chloride: 97 mmol/L (ref 96–106)
Creatinine, Ser: 0.79 mg/dL (ref 0.76–1.27)
Glucose: 379 mg/dL — ABNORMAL HIGH (ref 70–99)
Potassium: 4.6 mmol/L (ref 3.5–5.2)
Sodium: 138 mmol/L (ref 134–144)
eGFR: 123 mL/min/{1.73_m2} (ref >59)

## 2022-06-03 LAB — MICROALBUMIN / CREATININE URINE RATIO
Creatinine, Urine: 25.5 mg/dL
Microalb/Creat Ratio: 121 mg/g creat — ABNORMAL HIGH (ref 0–29)
Microalbumin, Urine: 30.9 ug/mL

## 2022-06-05 ENCOUNTER — Ambulatory Visit (HOSPITAL_COMMUNITY): Payer: No Payment, Other | Admitting: Licensed Clinical Social Worker

## 2022-06-05 DIAGNOSIS — F411 Generalized anxiety disorder: Secondary | ICD-10-CM

## 2022-06-05 DIAGNOSIS — F333 Major depressive disorder, recurrent, severe with psychotic symptoms: Secondary | ICD-10-CM

## 2022-06-08 ENCOUNTER — Other Ambulatory Visit: Payer: Self-pay

## 2022-06-10 ENCOUNTER — Other Ambulatory Visit: Payer: Self-pay

## 2022-06-18 ENCOUNTER — Other Ambulatory Visit: Payer: Self-pay

## 2022-06-22 ENCOUNTER — Other Ambulatory Visit: Payer: Self-pay

## 2022-06-24 ENCOUNTER — Encounter: Payer: Self-pay | Admitting: Physician Assistant

## 2022-06-24 ENCOUNTER — Ambulatory Visit: Payer: Self-pay

## 2022-06-24 ENCOUNTER — Ambulatory Visit: Payer: Self-pay | Admitting: Physician Assistant

## 2022-06-24 ENCOUNTER — Other Ambulatory Visit: Payer: Self-pay

## 2022-06-24 VITALS — BP 126/79 | HR 99 | Resp 18 | Ht 72.0 in | Wt 231.0 lb

## 2022-06-24 DIAGNOSIS — Z794 Long term (current) use of insulin: Secondary | ICD-10-CM

## 2022-06-24 DIAGNOSIS — L02821 Furuncle of head [any part, except face]: Secondary | ICD-10-CM

## 2022-06-24 DIAGNOSIS — E1165 Type 2 diabetes mellitus with hyperglycemia: Secondary | ICD-10-CM

## 2022-06-24 MED ORDER — MUPIROCIN 2 % EX OINT
1.0000 | TOPICAL_OINTMENT | Freq: Two times a day (BID) | CUTANEOUS | 0 refills | Status: AC
  Filled 2022-06-24: qty 22, 11d supply, fill #0

## 2022-06-24 MED ORDER — MUPIROCIN CALCIUM 2 % EX CREA
1.0000 | TOPICAL_CREAM | Freq: Two times a day (BID) | CUTANEOUS | 0 refills | Status: DC
  Filled 2022-06-24: qty 15, 8d supply, fill #0

## 2022-06-24 MED ORDER — DOXYCYCLINE HYCLATE 100 MG PO TABS
100.0000 mg | ORAL_TABLET | Freq: Two times a day (BID) | ORAL | 0 refills | Status: DC
  Filled 2022-06-24: qty 20, 10d supply, fill #0

## 2022-06-24 NOTE — Telephone Encounter (Signed)
   Chief Complaint: Insect bite to head in hairline Symptoms: Pain, eyes are swollen Frequency: Yesterday Pertinent Negatives: Patient denies fever Disposition: [] ED /[] Urgent Care (no appt availability in office) / [] Appointment(In office/virtual)/ []  [facility]/ [] Home Care/ [] Refused Recommended Disposition /[x] [facility]/ []  Follow-up with PCP Additional Notes: Instructed to go to ED for worsening of symptoms.  Reason for Disposition  [1] Red or very tender (to touch) area AND [2] started over 24 hours after the bite  Answer Assessment - Initial Assessment Questions 1. TYPE of INSECT: "What type of insect was it?"      Unsure 2. ONSET: "When did you get bitten?"      2 days ago 3. LOCATION: "Where is the insect bite located?"      Head in hairline 4. REDNESS: "Is the area red or pink?" If Yes, ask: "What size is area of redness?" (inches or cm). "When did the redness start?"     Red, raised 5. PAIN: "Is there any pain?" If Yes, ask: "How bad is it?"  (Scale 1-10; or mild, moderate, severe)     Now - 4 6. ITCHING: "Does it itch?" If Yes, ask: "How bad is the itch?"    - MILD: doesn't interfere with normal activities   - MODERATE-SEVERE: interferes with work, school, sleep, or other activities      No 7. SWELLING: "How big is the swelling?" (inches, cm, or compare to coins)     Eyes are swollen 8. OTHER SYMPTOMS: "Do you have any other symptoms?"  (e.g., difficulty breathing, hives)     No 9. PREGNANCY: "Is there any chance you are pregnant?" "When was your last menstrual period?"     N/a  Protocols used: Insect Bite-A-AH

## 2022-06-24 NOTE — Patient Instructions (Signed)
You are going to take doxycycline twice a day for the next 10 days.  You will also use Bactroban twice a day on the affected area.  I hope that you feel better soon, please let us know if there is anything else we can do for you.  Roney Jaffe, PA-C Physician Assistant [facility] [email or web address]   Skin Abscess  A skin abscess is an infected area on or under your skin that contains a collection of pus and other material. An abscess may also be called a furuncle, carbuncle, or boil. An abscess can occur in or on almost any part of your body. Some abscesses break open (rupture) on their own. Most continue to get worse unless they are treated. The infection can spread deeper into the body and eventually into your blood, which can make you feel ill. Treatment usually involves draining the abscess. What are the causes? An abscess occurs when germs, like bacteria, pass through your skin and cause an infection. This may be caused by: A scrape or cut on your skin. A puncture wound through your skin, including a needle injection or insect bite. Blocked oil or sweat glands. Blocked and infected hair follicles. A cyst that forms beneath your skin (sebaceous cyst) and becomes infected. What increases the risk? This condition is more likely to develop in people who: Have a weak body defense system (immune system). Have diabetes. Have dry and irritated skin. Get frequent injections or use illegal IV drugs. Have a foreign body in a wound, such as a splinter. Have problems with their lymph system or veins. What are the signs or symptoms? Symptoms of this condition include: A painful, firm bump under the skin. A bump with pus at the top. This may break through the skin and drain. Other symptoms include: Redness surrounding the abscess site. Warmth. Swelling of the lymph nodes (glands) near the abscess. Tenderness. A sore on the  skin. How is this diagnosed? This condition may be diagnosed based on: A physical exam. Your medical history. A sample of pus. This may be used to find out what is causing the infection. Blood tests. Imaging tests, such as an ultrasound, CT scan, or MRI. How is this treated? A small abscess that drains on its own may not need treatment. Treatment for larger abscesses may include: Moist heat or heat pack applied to the area several times a day. A procedure to drain the abscess (incision and drainage). Antibiotic medicines. For a severe abscess, you may first get antibiotics through an IV and then change to antibiotics by mouth. Follow these instructions at home: Medicines  Take over-the-counter and prescription medicines only as told by your health care provider. If you were prescribed an antibiotic medicine, take it as told by your health care provider. Do not stop taking the antibiotic even if you start to feel better. Abscess care  If you have an abscess that has not drained, apply heat to the affected area. Use the heat source that your health care provider recommends, such as a moist heat pack or a heating pad. Place a towel between your skin and the heat source. Leave the heat on for 20-30 minutes. Remove the heat if your skin turns bright red. This is especially important if you are unable to feel pain, heat, or cold. You may have a greater risk of getting burned. Follow instructions from your health care provider about how to take care of your abscess. Make sure you: Cover the abscess  with a bandage (dressing). Change your dressing or gauze as told by your health care provider. Wash your hands with soap and water before you change the dressing or gauze. If soap and water are not available, use hand sanitizer. Check your abscess every day for signs of a worsening infection. Check for: More redness, swelling, or pain. More fluid or blood. Warmth. More pus or a bad smell. General  instructions To avoid spreading the infection: Do not share personal care items, towels, or hot tubs with others. Avoid making skin contact with other people. Keep all follow-up visits as told by your health care provider. This is important. Contact a health care provider if you have: More redness, swelling, or pain around your abscess. More fluid or blood coming from your abscess. Warm skin around your abscess. More pus or a bad smell coming from your abscess. Muscle aches. Chills or a general ill feeling. Get help right away if you: Have severe pain. See red streaks on your skin spreading away from the abscess. See redness that spreads quickly. Have a fever or chills. Summary A skin abscess is an infected area on or under your skin that contains a collection of pus and other material. A small abscess that drains on its own may not need treatment. Treatment for larger abscesses may include having a procedure to drain the abscess and taking an antibiotic. This information is not intended to replace advice given to you by your health care provider. Make sure you discuss any questions you have with your health care provider. Document Revised: 09/08/2021 Document Reviewed: 09/08/2021 Elsevier Patient Education  2023 [employer].

## 2022-06-24 NOTE — Progress Notes (Unsigned)
   Established Patient Office Visit  Subjective   Patient ID: Ernest Mccann, male    DOB: 1991-04-07  Age: 31 y.o. MRN: [id number]  No chief complaint on file.   States that he was biten by unknown bug last Friday, kept getting bigger and leaking pus and painful Whole face is puffy and jaw pain     {History (Optional):23778}  ROS    Objective:     There were no vitals taken for this visit. {Vitals History (Optional):23777}  Physical Exam   No results found for any visits on 06/24/22.  {Labs (Optional):23779}  The ASCVD Risk score (Arnett DK, et al., 2019) failed to calculate for the following reasons:   The 2019 ASCVD risk score is only valid for ages 43 to 45    Assessment & Plan:   Problem List Items Addressed This Visit   None   No follow-ups on file.    Kasandra Knudsen Mayers, PA-C

## 2022-06-25 ENCOUNTER — Other Ambulatory Visit: Payer: Self-pay

## 2022-06-26 ENCOUNTER — Ambulatory Visit (INDEPENDENT_AMBULATORY_CARE_PROVIDER_SITE_OTHER): Payer: No Payment, Other | Admitting: Licensed Clinical Social Worker

## 2022-06-26 DIAGNOSIS — F333 Major depressive disorder, recurrent, severe with psychotic symptoms: Secondary | ICD-10-CM

## 2022-06-26 DIAGNOSIS — F411 Generalized anxiety disorder: Secondary | ICD-10-CM

## 2022-06-26 DIAGNOSIS — F4310 Post-traumatic stress disorder, unspecified: Secondary | ICD-10-CM
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
## 2022-07-07 ENCOUNTER — Other Ambulatory Visit: Payer: Self-pay

## 2022-07-21 ENCOUNTER — Other Ambulatory Visit: Payer: Self-pay | Admitting: Family Medicine

## 2022-07-21 ENCOUNTER — Other Ambulatory Visit: Payer: Self-pay

## 2022-07-21 DIAGNOSIS — N529 Male erectile dysfunction, unspecified: Secondary | ICD-10-CM

## 2022-07-21 DIAGNOSIS — E1142 Type 2 diabetes mellitus with diabetic polyneuropathy: Secondary | ICD-10-CM

## 2022-07-21 MED ORDER — SILDENAFIL CITRATE 100 MG PO TABS
100.0000 mg | ORAL_TABLET | Freq: Every day | ORAL | 1 refills | Status: DC | PRN
  Filled 2022-07-21: qty 10, 30d supply, fill #0
  Filled 2022-08-30 – 2022-10-14 (×2): qty 10, 30d supply, fill #1

## 2022-07-21 NOTE — Telephone Encounter (Signed)
Requested medication (s) are due for refill today: Yes  Requested medication (s) are on the active medication list: Yes  Last refill:  06/02/22  Future visit scheduled: Yes  Notes to clinic:  See request.    Requested Prescriptions  Pending Prescriptions Disp Refills   Semaglutide, 1 MG/DOSE, (OZEMPIC, 1 MG/DOSE,) 4 MG/3ML SOPN 3 mL 0    Sig: Inject 1 mg as directed once a week. For 4 weeks then increase to 2 mg thereafter     Endocrinology:  Diabetes - GLP-1 Receptor Agonists - semaglutide Failed - 07/21/2022  9:19 AM      Failed - HBA1C in normal range and within 180 days    HbA1c, POC (controlled diabetic range)  Date Value Ref Range Status  06/02/2022 11.3 (A) 0.0 - 7.0 % Final         Passed - Cr in normal range and within 360 days    Creat  Date Value Ref Range Status  06/14/2015 0.78 0.50 - 1.35 mg/dL Final   Creatinine, Ser  Date Value Ref Range Status  06/02/2022 0.79 0.76 - 1.27 mg/dL Final         Passed - Valid encounter within last 6 months    Recent Outpatient Visits           1 month ago Type 2 diabetes mellitus with diabetic polyneuropathy, with long-term current use of insulin (HCC)   [place] [facility] [place], Odette Horns, MD   2 months ago Type 2 diabetes mellitus with diabetic polyneuropathy, with long-term current use of insulin [facility])   [place] [facility] [place], [place], [place]   2 months ago Type 2 diabetes mellitus with diabetic polyneuropathy, with long-term current use of insulin [facility])   [place] [facility] [place], Jeannett Senior L, RPH-CPP   3 months ago Type 2 diabetes mellitus with diabetic polyneuropathy, with long-term current use of insulin [facility])   [facility] [facility] [place], Jeannett Senior L, RPH-CPP   4 months ago Type 2 diabetes mellitus with diabetic polyneuropathy, with long-term current use of insulin (HCC)   [place] [facility]  [place], Odette Horns, MD       Future Appointments             In 1 month Hoy Register, MD [facility]            Signed Prescriptions Disp Refills   sildenafil (VIAGRA) 100 MG tablet 10 tablet 1    Sig: Take 1 tablet (100 mg total) by mouth daily as needed for erectile dysfunction. At least 24 hours between doses     Urology: Erectile Dysfunction Agents Passed - 07/21/2022  9:19 AM      Passed - AST in normal range and within 360 days    AST  Date Value Ref Range Status  03/02/2022 12 0 - 40 IU/L Final         Passed - ALT in normal range and within 360 days    ALT  Date Value Ref Range Status  03/02/2022 17 0 - 44 IU/L Final         Passed - Last BP in normal range    BP Readings from Last 1 Encounters:  06/24/22 126/79         Passed - Valid encounter within last 12 months    Recent Outpatient Visits  1 month ago Type 2 diabetes mellitus with diabetic polyneuropathy, with long-term current use of insulin (HCC)   [place] [facility] [place], [place], [place]   2 months ago Type 2 diabetes mellitus with diabetic polyneuropathy, with long-term current use of insulin [facility])   [place] [facility] [place], [place], [place]   2 months ago Type 2 diabetes mellitus with diabetic polyneuropathy, with long-term current use of insulin [facility])   [place] [facility] [place], Jeannett Senior L, RPH-CPP   3 months ago Type 2 diabetes mellitus with diabetic polyneuropathy, with long-term current use of insulin [facility])   [place] [facility] [place], Cornelius Moras, RPH-CPP   4 months ago Type 2 diabetes mellitus with diabetic polyneuropathy, with long-term current use of insulin (HCC)    [facility] Hoy Register, MD       Future Appointments             In 1 month Hoy Register, MD [facility]

## 2022-07-22 ENCOUNTER — Other Ambulatory Visit: Payer: Self-pay

## 2022-07-22 MED ORDER — SEMAGLUTIDE (2 MG/DOSE) 8 MG/3ML ~~LOC~~ SOPN
2.0000 mg | PEN_INJECTOR | SUBCUTANEOUS | 6 refills | Status: DC
  Filled 2022-08-30: qty 6, 56d supply, fill #0

## 2022-07-24 ENCOUNTER — Ambulatory Visit (INDEPENDENT_AMBULATORY_CARE_PROVIDER_SITE_OTHER): Payer: No Payment, Other | Admitting: Licensed Clinical Social Worker

## 2022-07-24 DIAGNOSIS — F411 Generalized anxiety disorder: Secondary | ICD-10-CM

## 2022-07-24 DIAGNOSIS — F333 Major depressive disorder, recurrent, severe with psychotic symptoms: Secondary | ICD-10-CM | POA: Diagnosis not present

## 2022-07-24 DIAGNOSIS — F4310 Post-traumatic stress disorder, unspecified: Secondary | ICD-10-CM

## 2022-07-24 NOTE — Progress Notes (Signed)
   THERAPIST PROGRESS NOTE  Virtual Visit via Video Note  I connected with Srihari A Baldi on 07/24/22 at  9:00 AM EDT by a video enabled telemedicine application and verified that I am speaking with the correct person using two identifiers.  Location: Patient: [facility]  Provider: [facility]    I discussed the limitations of evaluation and management by telemedicine and the availability of in person appointments. The patient expressed understanding and agreed to proceed.      I discussed the assessment and treatment plan with the patient. The patient was provided an opportunity to ask questions and all were answered. The patient agreed with the plan and demonstrated an understanding of the instructions.   The patient was advised to call back or seek an in-person evaluation if the symptoms worsen or if the condition fails to improve as anticipated.  I provided 40 minutes of non-face-to-face time during this encounter.   [name], LCSW   Participation Level: Active  Behavioral Response: CasualAlertAnxious and Depressed  Type of Therapy: Individual Therapy    Interventions: CBT    Suicidal/Homicidal: Nowithout intent/plan  Therapist Response:    Pt was alert and oriented x 5. He was dressed casually and engaged well in therapy session. Pt presented with depressed and anxious mood/affect. Turner was pleasant, cooperative, and maintained good eye contact.   Primary stressor is coping skills for irritability and legal. Pt talked today about the source of his anger from his relationship with his parents. Journee states his parents fought a lot growing up and that is what he was shown from age 31 or 17. Pt identifies that irritability may increase with people that are close to him. Other stressor is mediation with his child's mother. Pt asked for letter reporting compliance with therapy. LCSW was agreeable to write it by next week.   Intervention/Plan:  LCSW used CBT for education on defense mechanism. LCSW spoke today about "Projection and displacement". LCSW also educated pt on grounding techniques for "5,4,3,2,1 grounding technique". Both defense mechanism and grounding technique sent via email to pt. LCSW used supportive therapy for praise and encouragement. LCSW taught pt about addressing how to address problems then treating the problem directly.      Plan: Return again in 3 weeks.  Diagnosis: Severe episode of recurrent major depressive disorder, with psychotic features (HCC)  GAD (generalized anxiety disorder)  PTSD (post-traumatic stress disorder)  Collaboration of Care: Other none today   Patient/Guardian was advised Release of Information must be obtained prior to any record release in order to collaborate their care with an outside provider. Patient/Guardian was advised if they have not already done so to contact the registration department to sign all necessary forms in order for [place] to release information regarding their care.   Consent: Patient/Guardian gives verbal consent for treatment and assignment of benefits for services provided during this visit. Patient/Guardian expressed understanding and agreed to proceed.   [name], LCSW 07/24/2022

## 2022-07-30 ENCOUNTER — Other Ambulatory Visit: Payer: Self-pay

## 2022-08-05 ENCOUNTER — Encounter: Payer: Self-pay | Admitting: Gastroenterology

## 2022-08-13 ENCOUNTER — Ambulatory Visit (INDEPENDENT_AMBULATORY_CARE_PROVIDER_SITE_OTHER): Payer: No Payment, Other | Admitting: Licensed Clinical Social Worker

## 2022-08-13 DIAGNOSIS — F333 Major depressive disorder, recurrent, severe with psychotic symptoms: Secondary | ICD-10-CM

## 2022-08-13 DIAGNOSIS — F4310 Post-traumatic stress disorder, unspecified: Secondary | ICD-10-CM

## 2022-08-13 DIAGNOSIS — F411 Generalized anxiety disorder: Secondary | ICD-10-CM

## 2022-08-13 NOTE — Progress Notes (Addendum)
THERAPIST PROGRESS NOTE  Virtual Visit via Video Note  I connected with Ernest Mccann on 08/13/22 at  8:00 AM EDT by a video enabled telemedicine application and verified that I am speaking with the correct person using two identifiers.  Location: Patient: [facility]  Provider: [facility]    I discussed the limitations of evaluation and management by telemedicine and the availability of in person appointments. The patient expressed understanding and agreed to proceed.     I discussed the assessment and treatment plan with the patient. The patient was provided an opportunity to ask questions and all were answered. The patient agreed with the plan and demonstrated an understanding of the instructions.   The patient was advised to call back or seek an in-person evaluation if the symptoms worsen or if the condition fails to improve as anticipated.  I provided 30 minutes of non-face-to-face time during this encounter.   Ernest Cooks, LCSW   Participation Level: Active  Behavioral Response: CasualAlertAnxious and Depressed  Type of Therapy: Individual Therapy  Treatment Goals addressed: Identify 3 triggers for depression   ProgressTowards Goals: Progressing  Interventions: CBT, Motivational Interviewing, and Supportive    Suicidal/Homicidal: Nowithout intent/plan  Therapist Response:    Pt was alert and oriented x 5. He was dressed casually and engaged well I therapy session. Pt presented with depressed and anxious mood/affect. He was pleasant, cooperative, and maintained good eye contact.   Pt reports that his primary stressors for ex relationship, current relationship, and financials. Pt reports that his ex-wife is trying to solve things outside of court. Ernest Mccann believes this is for closure on her end. Pt reports that he is open to this if it resolves their custody issue without legal interventions. Pt reports there is stressors for current  relationship. Pt reports that she has been interested in his financials as he makes more but her money "goes farther". Ernest Mccann states this is because of previous debt he was acquired over the years. Pt reports that there has been tension between his girlfriend and his ex. A few weeks ago, Ernest Mccann states his girlfriend son came home at midnight. The rule if he needs to be in the house prior to midnight or stay somewhere else because of the other children in the household. Pt reports that the son did not like this and called his biological father who was upset that his son had to stay at another place. This cause verbal confrontation with pt girlfriend and her ex.   Intervention/Plan: Pt endorses symptoms for overall stabilization of depression and anxiety stating, "It has not got better or worse since the last time we talked". LCSW used interventions for psychoanalytic therapy, supportive therapy, and motivational interviewing. LCSW used specific interventions for open ended question, reflective listening, praise and encouragement. LCSW allowed pt to express thoughts, feelings, and emotions  with unconditional positive regard.   Plan: Return again in 3 weeks.  Diagnosis: Severe episode of recurrent major depressive disorder, with psychotic features (HCC)  GAD (generalized anxiety disorder)  PTSD (post-traumatic stress disorder)  Collaboration of Care: Other None today   Patient/Guardian was advised Release of Information must be obtained prior to any record release in order to collaborate their care with an outside provider. Patient/Guardian was advised if they have not already done so to contact the registration department to sign all necessary forms in order for [place] to release information regarding their care.   Consent: Patient/Guardian gives verbal consent for treatment and assignment  of benefits for services provided during this visit. Patient/Guardian expressed understanding and agreed to  proceed.   Ernest Cooks, LCSW 08/13/2022

## 2022-08-18 ENCOUNTER — Telehealth (HOSPITAL_COMMUNITY): Payer: No Payment, Other | Admitting: Physician Assistant

## 2022-08-31 ENCOUNTER — Telehealth (HOSPITAL_COMMUNITY): Payer: Self-pay | Admitting: Licensed Clinical Social Worker

## 2022-08-31 ENCOUNTER — Other Ambulatory Visit: Payer: Self-pay

## 2022-08-31 NOTE — Telephone Encounter (Signed)
Attempted to call patient to cancel appointment on September 22 due to family emergency by this LCSW.  LCSW left voicemail for patient that appointment would be canceled for September 22 and to call [phone] to reschedule a future appointment.  LCSW did state on voicemail that his next appointment would be October 27.

## 2022-09-01 ENCOUNTER — Other Ambulatory Visit: Payer: Self-pay

## 2022-09-03 ENCOUNTER — Other Ambulatory Visit: Payer: Self-pay

## 2022-09-04 ENCOUNTER — Ambulatory Visit (HOSPITAL_COMMUNITY): Payer: No Payment, Other | Admitting: Licensed Clinical Social Worker

## 2022-09-08 ENCOUNTER — Other Ambulatory Visit: Payer: Self-pay

## 2022-09-09 ENCOUNTER — Other Ambulatory Visit: Payer: Self-pay

## 2022-09-09 ENCOUNTER — Encounter: Payer: Self-pay | Admitting: Family Medicine

## 2022-09-09 ENCOUNTER — Ambulatory Visit: Payer: Medicaid Other | Attending: Family Medicine | Admitting: Family Medicine

## 2022-09-09 VITALS — BP 151/108 | HR 88 | Temp 98.5°F | Ht 72.0 in | Wt 220.2 lb

## 2022-09-09 DIAGNOSIS — I152 Hypertension secondary to endocrine disorders: Secondary | ICD-10-CM

## 2022-09-09 DIAGNOSIS — Z794 Long term (current) use of insulin: Secondary | ICD-10-CM

## 2022-09-09 DIAGNOSIS — E1142 Type 2 diabetes mellitus with diabetic polyneuropathy: Secondary | ICD-10-CM

## 2022-09-09 DIAGNOSIS — E1159 Type 2 diabetes mellitus with other circulatory complications: Secondary | ICD-10-CM

## 2022-09-09 LAB — GLUCOSE, POCT (MANUAL RESULT ENTRY): POC Glucose: 355 mg/dl — AB (ref 70–99)

## 2022-09-09 LAB — POCT GLYCOSYLATED HEMOGLOBIN (HGB A1C): HbA1c, POC (controlled diabetic range): 11.8 % — AB (ref 0.0–7.0)

## 2022-09-09 MED ORDER — SEMAGLUTIDE (1 MG/DOSE) 4 MG/3ML ~~LOC~~ SOPN
1.0000 mg | PEN_INJECTOR | SUBCUTANEOUS | 6 refills | Status: DC
  Filled 2022-09-09: qty 3, 28d supply, fill #0
  Filled 2022-10-14: qty 3, 28d supply, fill #1
  Filled 2022-11-13: qty 9, 84d supply, fill #2
  Filled 2023-01-29: qty 9, 84d supply, fill #3
  Filled 2023-02-10: qty 6, 56d supply, fill #3
  Filled 2023-02-10: qty 3, 28d supply, fill #3

## 2022-09-09 MED ORDER — TRUE METRIX BLOOD GLUCOSE TEST VI STRP
ORAL_STRIP | 12 refills | Status: DC
  Filled 2022-09-09: qty 100, 33d supply, fill #0
  Filled 2023-01-29 (×2): qty 100, 33d supply, fill #1

## 2022-09-09 MED ORDER — TRUEPLUS LANCETS 28G MISC
1.0000 | Freq: Three times a day (TID) | 12 refills | Status: DC
  Filled 2022-09-09: qty 100, 33d supply, fill #0
  Filled 2023-01-29 (×2): qty 100, 33d supply, fill #1

## 2022-09-09 MED ORDER — BASAGLAR KWIKPEN 100 UNIT/ML ~~LOC~~ SOPN
40.0000 [IU] | PEN_INJECTOR | Freq: Every day | SUBCUTANEOUS | 3 refills | Status: DC
  Filled 2022-09-09: qty 12, 30d supply, fill #0
  Filled 2022-10-14: qty 12, 30d supply, fill #1
  Filled 2022-11-27 – 2023-04-09 (×7): qty 12, 30d supply, fill #2

## 2022-09-09 MED ORDER — TRUE METRIX METER W/DEVICE KIT
1.0000 | PACK | Freq: Three times a day (TID) | 0 refills | Status: DC
  Filled 2022-09-09: qty 1, 30d supply, fill #0

## 2022-09-09 NOTE — Progress Notes (Signed)
Subjective:  Patient ID: Ernest Mccann, male    DOB: [birth year]  Age: 31 y.o. MRN: [id number]  CC: Diabetes   HPI Ernest Mccann is a 31 y.o. year old male with a history of type 2 diabetes mellitus (A1c 11.8), anxiety and depression, hypertension here for follow-up of chronic medical conditions.  Interval History: He Complains of nausea and vomiting when he administers Ozempic.  Currently on 2 mg of Ozempic which was increased from 1 mg.  He did not experience this much GI adverse effects with the lower dose.  Endorses still being adherent with his Ozempic despite his symptoms. He has not been checking his sugars as he ran out of test strips. In the past Metformin causes GI side effects. He eats a lot of rice but states he cuts out sweets. A1c is 11.8 up from 11.3 previously.  He has been on Lantus 34 units as he decreased from 36 units when he started experiencing nausea and vomiting.  BP is elevated and he is yet to take his Lisinopril as he dd not stay at home. Past Medical History:  Diagnosis Date   Depression    Diabetes mellitus without complication ([place])    Diabetes mellitus without complication ([place])    Type II    Past Surgical History:  Procedure Laterality Date   KNEE ARTHROPLASTY     TONSILLECTOMY      Family History  Problem Relation Age of Onset   Diabetes Mother    Diabetes Father    Heart disease Father    Renal Disease Father    Cancer Maternal Aunt    Heart disease Maternal Grandfather     Social History   Socioeconomic History   Marital status: Married    Spouse name: Not on file   Number of children: Not on file   Years of education: Not on file   Highest education level: Not on file  Occupational History   Not on file  Tobacco Use   Smoking status: Never   Smokeless tobacco: Never  Substance and Sexual Activity   Alcohol use: Yes    Comment: socially   Drug use: Yes    Comment: THC   Sexual activity: Yes  Other Topics  Concern   Not on file  Social History Narrative   ** Merged History Encounter **       Social Determinants of Health   Financial Resource Strain: High Risk (11/12/2021)   Overall Financial Resource Strain (CARDIA)    Difficulty of Paying Living Expenses: Very hard  Food Insecurity: Food Insecurity Present (11/12/2021)   Hunger Vital Sign    Worried About Running Out of Food in the Last Year: Often true    Ran Out of Food in the Last Year: Often true  Transportation Needs: Unmet Transportation Needs (11/12/2021)   PRAPARE - Hydrologist (Medical): No    Lack of Transportation (Non-Medical): Yes  Physical Activity: Sufficiently Active (11/12/2021)   Exercise Vital Sign    Days of Exercise per Week: 4 days    Minutes of Exercise per Session: 60 Mccann  Stress: Stress Concern Present (11/12/2021)   [place]    Feeling of Stress : Rather much  Social Connections: Moderately Isolated (11/12/2021)   Social Connection and Isolation Panel [NHANES]    Frequency of Communication with Friends and Family: More than three times a week    Frequency of Social  Gatherings with Friends and Family: Twice a week    Attends Religious Services: 1 to 4 times per year    Active Member of [employer] or Organizations: No    Attends Banker Meetings: Never    Marital Status: Separated    No Known Allergies  Outpatient Medications Prior to Visit  Medication Sig Dispense Refill   albuterol (PROVENTIL HFA;VENTOLIN HFA) 108 (90 BASE) MCG/ACT inhaler Inhale 2 puffs into the lungs every 6 (six) hours as needed for wheezing or shortness of breath. 3 Inhaler 3   busPIRone (BUSPAR) 15 MG tablet Take 1 tablet (15 mg total) by mouth daily. 30 tablet 2   FLUoxetine (PROZAC) 40 MG capsule Take 1 capsule (40 mg total) by mouth daily. 30 capsule 2   gabapentin (NEURONTIN) 300 MG capsule Take 1 capsule (300 mg total)  by mouth at bedtime. 30 capsule 6   Insulin Pen Needle 31G X 5 MM MISC Use 1 pen needle each daily at bedtime. 30 each 5   lisinopril (ZESTRIL) 10 MG tablet Take 1 tablet (10 mg total) by mouth daily. 30 tablet 6   Selenium Sulfide 2.25 % SHAM Use 2 (two) times a week. 180 mL 1   sildenafil (VIAGRA) 100 MG tablet Take 1 tablet (100 mg total) by mouth daily as needed for erectile dysfunction. At least 24 hours between doses 10 tablet 1   traZODone (DESYREL) 50 MG tablet Take 1 tablet (50 mg total) by mouth at bedtime. 30 tablet 1   Insulin Glargine (BASAGLAR KWIKPEN) 100 UNIT/ML Inject 36 Units into the skin daily. 12 mL 2   Semaglutide, 2 MG/DOSE, 8 MG/3ML SOPN Inject 2 mg as directed once a week. 3 mL 6   mupirocin ointment (BACTROBAN) 2 % Apply 1 Application topically 2 (two) times daily. (Patient not taking: Reported on 09/09/2022) 22 g 0   azithromycin (ZITHROMAX) 250 MG tablet Take 2 tabs by mouth on day 1, then take 1 tab by mouth once daily (Patient not taking: Reported on 06/24/2022) 6 tablet 0   benzonatate (TESSALON) 100 MG capsule Take 2 capsules (200 mg total) by mouth 2 (two) times daily as needed for cough. (Patient not taking: Reported on 06/24/2022) 40 capsule 0   doxycycline (VIBRA-TABS) 100 MG tablet Take 1 tablet (100 mg total) by mouth 2 (two) times daily. (Patient not taking: Reported on 09/09/2022) 20 tablet 0   polyethylene glycol powder (GLYCOLAX/MIRALAX) 17 GM/SCOOP powder Take 17 g by mouth daily. (Patient not taking: Reported on 06/24/2022) 3350 g 1   No facility-administered medications prior to visit.     ROS Review of Systems  Constitutional:  Negative for activity change and appetite change.  HENT:  Negative for sinus pressure and sore throat.   Respiratory:  Negative for chest tightness, shortness of breath and wheezing.   Cardiovascular:  Negative for chest pain and palpitations.  Gastrointestinal:  Positive for nausea and vomiting. Negative for abdominal  distention, abdominal pain and constipation.  Genitourinary: Negative.   Musculoskeletal: Negative.   Psychiatric/Behavioral:  Negative for behavioral problems and dysphoric mood.     Objective:  BP (!) 151/108   Pulse 88   Temp 98.5 F (36.9 C) (Oral)   Ht 6' (1.829 m)   Wt 220 lb 3.2 oz (99.9 kg)   SpO2 97%   BMI 29.86 kg/m      09/09/2022    9:34 AM 06/24/2022   11:51 AM 06/24/2022   11:48 AM  BP/Weight  Systolic  BP 151 126   Diastolic BP 108 79   Wt. (Lbs) 220.2  231  BMI 29.86 kg/m2  31.33 kg/m2      Physical Exam Constitutional:      Appearance: He is well-developed.  Cardiovascular:     Rate and Rhythm: Normal rate.     Heart sounds: Normal heart sounds. No murmur heard. Pulmonary:     Effort: Pulmonary effort is normal.     Breath sounds: Normal breath sounds. No wheezing or rales.  Chest:     Chest wall: No tenderness.  Abdominal:     General: Bowel sounds are normal. There is no distension.     Palpations: Abdomen is soft. There is no mass.     Tenderness: There is no abdominal tenderness.  Musculoskeletal:        General: Normal range of motion.     Right lower leg: No edema.     Left lower leg: No edema.  Neurological:     Mental Status: He is alert and oriented to person, place, and time.  Psychiatric:        Mood and Affect: Mood normal.        Latest Ref Rng & Units 06/02/2022    2:42 PM 03/02/2022   10:01 AM 09/19/2021    9:57 AM  CMP  Glucose 70 - 99 mg/dL 694  854  627   BUN 6 - 20 mg/dL 12  11  17    Creatinine 0.76 - 1.27 mg/dL  0.35  0.09   Sodium 134 - 144 mmol/L 138  135  135   Potassium 3.5 - 5.2 mmol/L 4.6  4.6  4.8   Chloride 96 - 106 mmol/L 97  98  97   CO2 20 - 29 mmol/L 24  23  22    Calcium 8.7 - 10.2 mg/dL 9.9  9.1  9.9   Total Protein 6.0 - 8.5 g/dL  6.5  6.8   Total Bilirubin 0.0 - 1.2 mg/dL  0.4  0.3   Alkaline Phos 44 - 121 IU/L  114  121   AST 0 - 40 IU/L  12  10   ALT 0 - 44 IU/L  17  19     Lipid Panel      Component Value Date/Time   CHOL 197 03/02/2022 1001   TRIG 183 (H) 03/02/2022 1001   HDL 35 (L) 03/02/2022 1001   CHOLHDL 6.8 (H) 09/19/2021 0957   CHOLHDL 4.2 08/15/2014 1211   VLDL 41 (H) 08/15/2014 1211   LDLCALC 129 (H) 03/02/2022 1001    CBC    Component Value Date/Time   WBC 4.1 04/12/2021 1534   RBC 4.86 04/12/2021 1534   HGB 13.4 04/12/2021 1534   HCT 38.3 (L) 04/12/2021 1534   PLT 294 04/12/2021 1534   MCV 78.8 (L) 04/12/2021 1534   MCH 27.6 04/12/2021 1534   MCHC 35.0 04/12/2021 1534   RDW 12.7 04/12/2021 1534   LYMPHSABS 2.5 07/02/2015 2316   MONOABS 0.4 07/02/2015 2316   EOSABS 0.2 07/02/2015 2316   BASOSABS 0.0 07/02/2015 2316    Lab Results  Component Value Date   HGBA1C 11.8 (A) 09/09/2022    Assessment & Plan:  1. Type 2 diabetes mellitus with diabetic polyneuropathy, with long-term current use of insulin (HCC) Uncontrolled with A1c of 11.8, goal is less than 7.0 Decreased Ozempic dose from 2 mg down to 1 mg due to GI adverse effect Lantus dose has been increased from  34 units to 40 units to compensate for decreased Ozempic dose Advised to increase Lantus by 2 units every fourth day until blood sugars are at goal He will return with his blood sugar log for clinical pharmacy visit and medication titration Counseled extensively on dietary modifications, my plate method, substituting white flour for whole wheat. He is due for an eye exam and we have discussed community resources available for self-pay options given he has no medical coverage. - POCT glucose (manual entry) - POCT glycosylated hemoglobin (Hb A1C) - Insulin Glargine (BASAGLAR KWIKPEN) 100 UNIT/ML; Inject 40 Units into the skin daily.  Dispense: 30 mL; Refill: 3 - TRUEplus Lancets 28G MISC; 1 each by Does not apply route 3 (three) times daily before meals.  Dispense: 100 each; Refill: 12 - Blood Glucose Monitoring Suppl (TRUE METRIX METER) DEVI; 1 each by Does not apply route 3 (three) times  daily before meals.  Dispense: 1 each; Refill: 0 - glucose blood (TRUE METRIX BLOOD GLUCOSE TEST) test strip; Use 3 times daily  Dispense: 100 each; Refill: 12 - Semaglutide, 1 MG/DOSE, 4 MG/3ML SOPN; Inject 1 mg as directed once a week.  Dispense: 3 mL; Refill: 6 - Basic Metabolic Panel  2. Hypertension associated with diabetes (HCC) Uncontrolled due to the fact that he is yet to take his antihypertensive Emphasized the need for medication adherence Counseled on blood pressure goal of less than 130/80, low-sodium, DASH diet, medication compliance, 150 minutes of moderate intensity exercise per week. Discussed medication compliance, adverse effects.    Meds ordered this encounter  Medications   Insulin Glargine (BASAGLAR KWIKPEN) 100 UNIT/ML    Sig: Inject 40 Units into the skin daily.    Dispense:  30 mL    Refill:  3    Dose increase   TRUEplus Lancets 28G MISC    Sig: 1 each by Does not apply route 3 (three) times daily before meals.    Dispense:  100 each    Refill:  12   Blood Glucose Monitoring Suppl (TRUE METRIX METER) DEVI    Sig: 1 each by Does not apply route 3 (three) times daily before meals.    Dispense:  1 each    Refill:  0   glucose blood (TRUE METRIX BLOOD GLUCOSE TEST) test strip    Sig: Use 3 times daily    Dispense:  100 each    Refill:  12   Semaglutide, 1 MG/DOSE, 4 MG/3ML SOPN    Sig: Inject 1 mg as directed once a week.    Dispense:  3 mL    Refill:  6    Dose decrease    Follow-up: Return in about 3 weeks (around 09/30/2022) for Blood pressure follow-up with Franky Macho, Medical conditions with PCP in 3 months.       Hoy Register, MD, FAAFP. [facility] [place], [place] [phone]   09/09/2022, 10:05 AM

## 2022-09-09 NOTE — Patient Instructions (Signed)

## 2022-09-09 NOTE — Progress Notes (Signed)
Discuss ozempic

## 2022-09-10 ENCOUNTER — Other Ambulatory Visit: Payer: Self-pay

## 2022-09-10 ENCOUNTER — Other Ambulatory Visit: Payer: Self-pay | Admitting: Family Medicine

## 2022-09-10 DIAGNOSIS — E1142 Type 2 diabetes mellitus with diabetic polyneuropathy: Secondary | ICD-10-CM

## 2022-09-10 LAB — BASIC METABOLIC PANEL
BUN/Creatinine Ratio: 15 (ref 9–20)
BUN: 12 mg/dL (ref 6–20)
CO2: 23 mmol/L (ref 20–29)
Calcium: 10.4 mg/dL — ABNORMAL HIGH (ref 8.7–10.2)
Chloride: 97 mmol/L (ref 96–106)
Creatinine, Ser: 0.79 mg/dL (ref 0.76–1.27)
Glucose: 381 mg/dL — ABNORMAL HIGH (ref 70–99)
Potassium: 5.1 mmol/L (ref 3.5–5.2)
Sodium: 136 mmol/L (ref 134–144)
eGFR: 123 mL/min/{1.73_m2} (ref >59)

## 2022-09-10 NOTE — Telephone Encounter (Signed)
Requested medication (s) are due for refill today: yes, new prescription.  Requested medication (s) are on the active medication list: yes  Last refill:  01/30/22  Future visit scheduled: yes  Notes to clinic:  Unable to refill per protocol, pharmacy request new script, routing for review.     Requested Prescriptions  Pending Prescriptions Disp Refills   Insulin Pen Needle (TECHLITE PEN NEEDLES) 31G X 5 MM MISC 30 each 5    Sig: Use 1 pen needle each daily at bedtime.     Endocrinology: Diabetes - Testing Supplies Passed - 09/10/2022  2:58 PM      Passed - Valid encounter within last 12 months    Recent Outpatient Visits           Yesterday Type 2 diabetes mellitus with diabetic polyneuropathy, with long-term current use of insulin ([place])   [place], Wisner, MD   3 months ago Type 2 diabetes mellitus with diabetic polyneuropathy, with long-term current use of insulin ([place])   [place], Weems, MD   4 months ago Type 2 diabetes mellitus with diabetic polyneuropathy, with long-term current use of insulin [facility])   [place], [place], [place]   4 months ago Type 2 diabetes mellitus with diabetic polyneuropathy, with long-term current use of insulin [facility])   [place], Annie Main L, RPH-CPP   5 months ago Type 2 diabetes mellitus with diabetic polyneuropathy, with long-term current use of insulin [facility])   [place], RPH-CPP       Future Appointments             In 3 weeks Daisy Blossom, Jarome [place], [place]   In 3 months Charlott Rakes, MD [place]

## 2022-09-11 ENCOUNTER — Other Ambulatory Visit: Payer: Self-pay

## 2022-09-11 MED ORDER — TECHLITE PEN NEEDLES 31G X 5 MM MISC
1.0000 | Freq: Every day | 5 refills | Status: DC
  Filled 2022-09-11: qty 100, 90d supply, fill #0
  Filled 2023-01-29: qty 100, 100d supply, fill #0
  Filled 2023-01-29 – 2023-05-18 (×3): qty 100, 90d supply, fill #0

## 2022-09-15 ENCOUNTER — Ambulatory Visit: Payer: Medicaid Other | Admitting: Gastroenterology

## 2022-09-15 NOTE — Progress Notes (Deleted)
[facility] Consult Note:  History: ARION SHANKLES 09/15/2022  Referring provider: Charlott Rakes, MD  Reason for consult/chief complaint: No chief complaint on file.   Subjective  HPI:  *** He was originally referred in May of this year with primary care note including the following: "Jasun Gasparini is a 31 y.o. male here today for several issues.  He has been having stomach issues for 1-2 months.  Also occurred last summer.  Some fecal incontinence overnight.  Lots of burping with stomach acid.  Immodium/pepto help.  No melena or hematochezia.  No abdominal pain other than cramping prior to fecal urgency.  Seems to be worsened with greasy and fatty foods.  Does not follow diabetic diet.  Ate chocolate and a big mac PTA.  Some solid brown stools/some loose/ sometimes mucus in stool.  No fevers.  Not on well water.  No travel. Not every day.  No FH inflammatory bowel disease.  Other than hyperglycemia, CMP unremarkable 2 months ago. "  His most recent primary care note last month includes the following: "He Complains of nausea and vomiting when he administers Ozempic.  Currently on 2 mg of Ozempic which was increased from 1 mg.  He did not experience this much GI adverse effects with the lower dose.  Endorses still being adherent with his Ozempic despite his symptoms. He has not been checking his sugars as he ran out of test strips. In the past Metformin causes GI side effects. He eats a lot of rice but states he cuts out sweets. A1c is 11.8 up from 11.3 previously.  He has been on Lantus 34 units as he decreased from 36 units when he started experiencing nausea and vomiting."  He has also been in regular counseling for anxiety and depression.     ROS:  Review of Systems   Past Medical History: Past Medical History:  Diagnosis Date   Depression    Diabetes mellitus without complication ([place])    Diabetes mellitus without complication ([place])     Type II     Past Surgical History: Past Surgical History:  Procedure Laterality Date   KNEE ARTHROPLASTY     TONSILLECTOMY       Family History: Family History  Problem Relation Age of Onset   Diabetes Mother    Diabetes Father    Heart disease Father    Renal Disease Father    Cancer Maternal Aunt    Heart disease Maternal Grandfather     Social History: Social History   Socioeconomic History   Marital status: Married    Spouse name: Not on file   Number of children: Not on file   Years of education: Not on file   Highest education level: Not on file  Occupational History   Not on file  Tobacco Use   Smoking status: Never   Smokeless tobacco: Never  Substance and Sexual Activity   Alcohol use: Yes    Comment: socially   Drug use: Yes    Comment: THC   Sexual activity: Yes  Other Topics Concern   Not on file  Social History Narrative   ** Merged History Encounter **       Social Determinants of Health   Financial Resource Strain: High Risk (11/12/2021)   Overall Financial Resource Strain (CARDIA)    Difficulty of Paying Living Expenses: Very hard  Food Insecurity: Food Insecurity Present (11/12/2021)   Hunger Vital Sign    Worried About Running Out of  Food in the Last Year: Often true    Ran Out of Food in the Last Year: Often true  Transportation Needs: Unmet Transportation Needs (11/12/2021)   PRAPARE - Transportation    Lack of Transportation (Medical): No    Lack of Transportation (Non-Medical): Yes  Physical Activity: Sufficiently Active (11/12/2021)   Exercise Vital Sign    Days of Exercise per Week: 4 days    Minutes of Exercise per Session: 60 min  Stress: Stress Concern Present (11/12/2021)   [place]    Feeling of Stress : Rather much  Social Connections: Moderately Isolated (11/12/2021)   Social Connection and Isolation Panel [NHANES]    Frequency of Communication with  Friends and Family: More than three times a week    Frequency of Social Gatherings with Friends and Family: Twice a week    Attends Religious Services: 1 to 4 times per year    Active Member of [employer] or Organizations: No    Attends Archivist Meetings: Never    Marital Status: Separated    Allergies: No Known Allergies  Outpatient Meds: Current Outpatient Medications  Medication Sig Dispense Refill   albuterol (PROVENTIL HFA;VENTOLIN HFA) 108 (90 BASE) MCG/ACT inhaler Inhale 2 puffs into the lungs every 6 (six) hours as needed for wheezing or shortness of breath. 3 Inhaler 3   Blood Glucose Monitoring Suppl (TRUE METRIX METER) w/Device KIT Use 3 (three) times daily before meals. 1 kit 0   busPIRone (BUSPAR) 15 MG tablet Take 1 tablet (15 mg total) by mouth daily. 30 tablet 2   FLUoxetine (PROZAC) 40 MG capsule Take 1 capsule (40 mg total) by mouth daily. 30 capsule 2   gabapentin (NEURONTIN) 300 MG capsule Take 1 capsule (300 mg total) by mouth at bedtime. 30 capsule 6   glucose blood (TRUE METRIX BLOOD GLUCOSE TEST) test strip Use 3 times daily 100 each 12   Insulin Glargine (BASAGLAR KWIKPEN) 100 UNIT/ML Inject 40 Units into the skin daily. 30 mL 3   Insulin Pen Needle (TECHLITE PEN NEEDLES) 31G X 5 MM MISC Use 1 pen needle each daily at bedtime. 100 each 5   lisinopril (ZESTRIL) 10 MG tablet Take 1 tablet (10 mg total) by mouth daily. 30 tablet 6   mupirocin ointment (BACTROBAN) 2 % Apply 1 Application topically 2 (two) times daily. (Patient not taking: Reported on 09/09/2022) 22 g 0   Selenium Sulfide 2.25 % SHAM Use 2 (two) times a week. 180 mL 1   Semaglutide, 1 MG/DOSE, 4 MG/3ML SOPN Inject 1 mg as directed once a week. 3 mL 6   sildenafil (VIAGRA) 100 MG tablet Take 1 tablet (100 mg total) by mouth daily as needed for erectile dysfunction. At least 24 hours between doses 10 tablet 1   traZODone (DESYREL) 50 MG tablet Take 1 tablet (50 mg total) by mouth at bedtime. 30  tablet 1   TRUEplus Lancets 28G MISC Use 3 (three) times daily before meals. 100 each 12   No current facility-administered medications for this visit.      ___________________________________________________________________ Objective   Exam:  There were no vitals taken for this visit. Wt Readings from Last 3 Encounters:  09/09/22 220 lb 3.2 oz (99.9 kg)  06/24/22 231 lb (104.8 kg)  06/02/22 230 lb 3.2 oz (104.4 kg)    General: ***  Eyes: sclera anicteric, no redness ENT: oral mucosa moist without lesions, no cervical or supraclavicular lymphadenopathy CV: ***,  no JVD, no peripheral edema Resp: clear to auscultation bilaterally, normal RR and effort noted GI: soft, *** tenderness, with active bowel sounds. No guarding or palpable organomegaly noted. Skin; warm and dry, no rash or jaundice noted Neuro: awake, alert and oriented x 3. Normal gross motor function and fluent speech  Labs:     Latest Ref Rng & Units 04/12/2021    3:34 PM 07/02/2015   11:16 PM 06/13/2015    3:28 PM  CBC  WBC 4.0 - 10.5 K/uL 4.1  5.5    Hemoglobin 13.0 - 17.0 g/dL 13.4  14.1  15.0   Hematocrit 39.0 - 52.0 % 38.3  42.1  44.0   Platelets 150 - 400 K/uL 294  320        Latest Ref Rng & Units 09/09/2022   10:12 AM 06/02/2022    2:42 PM 03/02/2022   10:01 AM  CMP  Glucose 70 - 99 mg/dL 381  379  361   BUN 6 - 20 mg/dL _0 Creatinine 0.76 - 1.27 mg/dL 0.79  0.79  0.72   Sodium 134 - 144 mmol/L 136  138  135   Potassium 3.5 - 5.2 mmol/L 5.1  4.6  4.6   Chloride 96 - 106 mmol/L 97  97  98   CO2 20 - 29 mmol/L _1 Calcium 8.7 - 10.2 mg/dL 10.4  9.9  9.1   Total Protein 6.0 - 8.5 g/dL   6.5   Total Bilirubin 0.0 - 1.2 mg/dL   0.4   Alkaline Phos 44 - 121 IU/L   114   AST 0 - 40 IU/L   12   ALT 0 - 44 IU/L   17      Radiologic Studies:  ***  Assessment: No diagnosis found.  ***  Plan:  ***  Thank you for the courtesy of this consult.  Please call me with any  questions or concerns.  Nelida Meuse III  CC: Referring provider noted above

## 2022-09-17 ENCOUNTER — Other Ambulatory Visit: Payer: Self-pay

## 2022-09-30 NOTE — Progress Notes (Deleted)
   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 PMH is significant for HTN, T2DM, MDD, GAD, and PTSD.   Patient was referred and last seen by Primary Care Provider, Dr. Margarita Rana, on 09/09/22. At last visit, BP was elevated at 151/108 mmHg. He had not taken his BP medication that morning as he did not stay at home the night prior.   Today, patient arrives in *** spirits and presents without *** assistance. *** Denies dizziness, headache, blurred vision, swelling.   Patient reports hypertension was diagnosed in ***.   Family/Social History:  -Fhx: DM, heart disease, renal disease  -Tobacco: never smoker  -Alcohol: socially  Medication adherence *** . Patient has *** taken BP medications today.   Current antihypertensives include: lisinopril 10mg  once daily  Reported home BP readings: ***  Patient reported dietary habits: Eats *** meals/day Breakfast: *** Lunch: *** Dinner: *** Snacks: *** Drinks: ***  Patient-reported exercise habits: ***  ASCVD risk factors include: ***  O:   Last 3 Office BP readings: BP Readings from Last 3 Encounters:  09/09/22 (!) 151/108  06/24/22 126/79  06/02/22 135/88    BMET    Component Value Date/Time   NA 136 09/09/2022 1012   K 5.1 09/09/2022 1012   CL 97 09/09/2022 1012   CO2 23 09/09/2022 1012   GLUCOSE 381 (H) 09/09/2022 1012   GLUCOSE 478 (H) 04/12/2021 1534   BUN 12 09/09/2022 1012   CREATININE 0.79 09/09/2022 1012   CREATININE 0.78 06/14/2015 1235   CALCIUM 10.4 (H) 09/09/2022 1012   GFRNONAA >60 04/12/2021 1534   GFRNONAA >89 08/15/2014 1211   GFRAA >60 07/02/2015 2316   GFRAA >89 08/15/2014 1211     A/P: Hypertension diagnosed *** currently *** on current medications. BP goal < 130/80 *** mmHg. Medication adherence appears ***. Control is suboptimal due to ***.  -{Meds adjust:18428} ***.  -Patient educated on purpose, proper use,  and potential adverse effects of ***.  -F/u labs ordered - *** -Counseled on lifestyle modifications for blood pressure control including reduced dietary sodium, increased exercise, adequate sleep. -Encouraged patient to check BP at home and bring log of readings to next visit. Counseled on proper use of home BP cuff.    Results reviewed and written information provided.    Written patient instructions provided. Patient verbalized understanding of treatment plan.  Total time in face to face counseling *** minutes.    Follow-up:  Pharmacist ***. PCP clinic visit in December  Maryan Puls, PharmD PGY-1 [facility] Pharmacy Resident

## 2022-10-01 ENCOUNTER — Ambulatory Visit: Payer: Medicaid Other | Attending: Pharmacist | Admitting: Pharmacist

## 2022-10-06 ENCOUNTER — Other Ambulatory Visit: Payer: Self-pay

## 2022-10-09 ENCOUNTER — Ambulatory Visit (INDEPENDENT_AMBULATORY_CARE_PROVIDER_SITE_OTHER): Payer: No Payment, Other | Admitting: Licensed Clinical Social Worker

## 2022-10-09 ENCOUNTER — Other Ambulatory Visit: Payer: Self-pay

## 2022-10-09 DIAGNOSIS — F333 Major depressive disorder, recurrent, severe with psychotic symptoms: Secondary | ICD-10-CM

## 2022-10-09 DIAGNOSIS — F411 Generalized anxiety disorder: Secondary | ICD-10-CM

## 2022-10-09 DIAGNOSIS — F4310 Post-traumatic stress disorder, unspecified: Secondary | ICD-10-CM
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
## 2022-10-15 ENCOUNTER — Other Ambulatory Visit: Payer: Self-pay

## 2022-10-16 ENCOUNTER — Other Ambulatory Visit: Payer: Self-pay

## 2022-10-23 ENCOUNTER — Ambulatory Visit (INDEPENDENT_AMBULATORY_CARE_PROVIDER_SITE_OTHER): Payer: No Payment, Other | Admitting: Licensed Clinical Social Worker

## 2022-10-23 DIAGNOSIS — F411 Generalized anxiety disorder: Secondary | ICD-10-CM

## 2022-10-23 DIAGNOSIS — F333 Major depressive disorder, recurrent, severe with psychotic symptoms: Secondary | ICD-10-CM

## 2022-10-23 NOTE — Progress Notes (Addendum)
   THERAPIST PROGRESS NOTE  Virtual Visit via Video Note  I connected with Ernest Mccann on 10/23/22 at  9:00 AM EST by a video enabled telemedicine application and verified that I am speaking with the correct person using two identifiers.  Location: Patient: [facility]  Provider: Providers Home    I discussed the limitations of evaluation and management by telemedicine and the availability of in person appointments. The patient expressed understanding and agreed to proceed.     I discussed the assessment and treatment plan with the patient. The patient was provided an opportunity to ask questions and all were answered. The patient agreed with the plan and demonstrated an understanding of the instructions.   The patient was advised to call back or seek an in-person evaluation if the symptoms worsen or if the condition fails to improve as anticipated.  I provided 46 minutes of non-face-to-face time during this encounter.   [name], LCSW   Participation Level: Active  Behavioral Response: CasualAlertAnxious and Depressed  Type of Therapy: Individual Therapy  Treatment Goals addressed: Terrelle WILL COMPLETE AT LEAST 80% OF ASSIGNED HOMEWORK   ProgressTowards Goals: Progressing  Interventions: Motivational Interviewing and Supportive   Suicidal/Homicidal: Nowithout intent/plan  Therapist Response:   Pt was alert and oriented x 5. He presented with depressed and anxious mood/affect. He was pleasant, cooperative, and maintained good eye contact. He engaged well in therapy session and was dressed casually.   Pt reports primary stressor is religious conflict and family conflict. Pt reports that he is a spiritual person and goes to church weekly with his significant other. Pt reports that he does not agree with all the views of this church or following. [name] reports he feels conflicted but wants to be supportive of his significant other but also wants to  grow his spiritual views. Other stressors are significant others son. Pt reports that there is conflict with pt girlfriend and her son physically, and emotionally. Pt reports that he feels tension and worry due to the conflict but knows that his girlfriend's son needs to learn his lessons on his own.   Intervention/Plan: LCSW used supportive therapy for praise and encouragement. LCSW used psychoanalytic therapy for pt to express thoughts, feelings, and emotions. LCSW used motivational interviewing for open ended questions, reflective listening, and positive affirmations.    Plan: Return again in 3 weeks.  Diagnosis: Severe episode of recurrent major depressive disorder, with psychotic features (HCC)  GAD (generalized anxiety disorder)  Collaboration of Care: Other None today   Patient/Guardian was advised Release of Information must be obtained prior to any record release in order to collaborate their care with an outside provider. Patient/Guardian was advised if they have not already done so to contact the registration department to sign all necessary forms in order for [place] to release information regarding their care.   Consent: Patient/Guardian gives verbal consent for treatment and assignment of benefits for services provided during this visit. Patient/Guardian expressed understanding and agreed to proceed.   [name], LCSW 10/23/2022

## 2022-11-04 ENCOUNTER — Other Ambulatory Visit: Payer: Self-pay

## 2022-11-13 ENCOUNTER — Other Ambulatory Visit: Payer: Self-pay

## 2022-11-20 ENCOUNTER — Ambulatory Visit (INDEPENDENT_AMBULATORY_CARE_PROVIDER_SITE_OTHER): Payer: No Payment, Other | Admitting: Licensed Clinical Social Worker

## 2022-11-20 DIAGNOSIS — F411 Generalized anxiety disorder: Secondary | ICD-10-CM

## 2022-11-20 DIAGNOSIS — F333 Major depressive disorder, recurrent, severe with psychotic symptoms: Secondary | ICD-10-CM
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
## 2022-11-27 ENCOUNTER — Other Ambulatory Visit (HOSPITAL_COMMUNITY): Payer: Self-pay

## 2022-11-27 ENCOUNTER — Other Ambulatory Visit: Payer: Self-pay | Admitting: Family Medicine

## 2022-11-27 DIAGNOSIS — N529 Male erectile dysfunction, unspecified: Secondary | ICD-10-CM

## 2022-11-27 MED ORDER — SILDENAFIL CITRATE 100 MG PO TABS
100.0000 mg | ORAL_TABLET | Freq: Every day | ORAL | 1 refills | Status: DC | PRN
  Filled 2022-11-27 (×2): qty 10, 10d supply, fill #0
  Filled 2022-11-30 – 2022-12-10 (×2): qty 10, 30d supply, fill #0
  Filled 2023-01-29 – 2023-02-10 (×4): qty 10, 30d supply, fill #1

## 2022-11-27 NOTE — Telephone Encounter (Signed)
Requested Prescriptions  Pending Prescriptions Disp Refills   sildenafil (VIAGRA) 100 MG tablet 10 tablet 1    Sig: Take 1 tablet (100 mg total) by mouth daily as needed for erectile dysfunction. At least 24 hours between doses     Urology: Erectile Dysfunction Agents Failed - 11/27/2022  2:29 PM      Failed - Last BP in normal range    BP Readings from Last 1 Encounters:  09/09/22 (!) 151/108         Passed - AST in normal range and within 360 days    AST  Date Value Ref Range Status  03/02/2022 12 0 - 40 IU/L Final         Passed - ALT in normal range and within 360 days    ALT  Date Value Ref Range Status  03/02/2022 17 0 - 44 IU/L Final         Passed - Valid encounter within last 12 months    Recent Outpatient Visits           2 months ago Type 2 diabetes mellitus with diabetic polyneuropathy, with long-term current use of insulin (HCC)   [facility] [place], [place], [place]   5 months ago Type 2 diabetes mellitus with diabetic polyneuropathy, with long-term current use of insulin (HCC)   [facility] [place], [place], [place]   6 months ago Type 2 diabetes mellitus with diabetic polyneuropathy, with long-term current use of insulin [facility])   [place] [facility] [place], [place], [place]   6 months ago Type 2 diabetes mellitus with diabetic polyneuropathy, with long-term current use of insulin [facility])   [place] [facility] [place], Jeannett Senior L, RPH-CPP   7 months ago Type 2 diabetes mellitus with diabetic polyneuropathy, with long-term current use of insulin [facility])   [place] [facility] [place]is [place], Cornelius Moras, RPH-CPP       Future Appointments             In 1 week Hoy Register, MD [facility]

## 2022-11-30 ENCOUNTER — Other Ambulatory Visit: Payer: Self-pay

## 2022-12-08 ENCOUNTER — Other Ambulatory Visit: Payer: Self-pay

## 2022-12-09 ENCOUNTER — Ambulatory Visit: Payer: Medicaid Other | Admitting: Family Medicine

## 2022-12-10 ENCOUNTER — Other Ambulatory Visit: Payer: Self-pay

## 2022-12-10 ENCOUNTER — Ambulatory Visit (HOSPITAL_COMMUNITY): Payer: No Payment, Other | Admitting: Licensed Clinical Social Worker

## 2022-12-17 ENCOUNTER — Other Ambulatory Visit: Payer: Self-pay

## 2023-01-08 ENCOUNTER — Ambulatory Visit (INDEPENDENT_AMBULATORY_CARE_PROVIDER_SITE_OTHER): Payer: MEDICAID | Admitting: Licensed Clinical Social Worker

## 2023-01-08 DIAGNOSIS — F333 Major depressive disorder, recurrent, severe with psychotic symptoms: Secondary | ICD-10-CM | POA: Diagnosis not present

## 2023-01-08 DIAGNOSIS — F411 Generalized anxiety disorder: Secondary | ICD-10-CM

## 2023-01-08 NOTE — Progress Notes (Signed)
THERAPIST PROGRESS NOTE  Virtual Visit via Video Note  I connected with Ernest Mccann on 01/08/23 at  8:00 AM EST by a video enabled telemedicine application and verified that I am speaking with the correct person using two identifiers.  Location: Patient: [facility]  Provider: Provider Home    I discussed the limitations of evaluation and management by telemedicine and the availability of in person appointments. The patient expressed understanding and agreed to proceed.  History of Present Illness:    Observations/Objective:   Assessment and Plan:   Follow Up Instructions:    I discussed the assessment and treatment plan with the patient. The patient was provided an opportunity to ask questions and all were answered. The patient agreed with the plan and demonstrated an understanding of the instructions.   The patient was advised to call back or seek an in-person evaluation if the symptoms worsen or if the condition fails to improve as anticipated.  I provided 40 minutes of non-face-to-face time during this encounter.   Dory Horn, LCSW   Participation Level: Active  Behavioral Response: CasualAlertAnxious and Depressed  Type of Therapy: Individual Therapy  Treatment Goals addressed:  Active     Anxiety Disorder CCP Problem  1 GAD     identify 3 triggers for anxiety  (Progressing)     Start:  03/20/22    Expected End:  02/12/23         LTG: Patient will score less than 5 on the Generalized Anxiety Disorder 7 Scale (GAD-7) (Progressing)     Start:  03/20/22    Expected End:  02/12/23         STG: Patient will practice problem solving skills 3 times per week for the next 4 weeks (Progressing)     Start:  03/20/22    Expected End:  02/12/23           Depression CCP Problem  1 MDD       walk 2 x weekly  (Not Progressing)     Start:  03/20/22    Expected End:  02/12/23         LTG: Norrin WILL SCORE LESS THAN 10 ON THE PATIENT  HEALTH QUESTIONNAIRE (PHQ-9) (Progressing)     Start:  03/20/22    Expected End:  02/12/23         STG: Rai WILL ATTEND AT LEAST 80% OF SCHEDULED GROUP PSYCHOTHERAPY SESSIONS (Progressing)     Start:  03/20/22    Expected End:  02/12/23         STG: Mathieu WILL COMPLETE AT LEAST 80% OF ASSIGNED HOMEWORK (Progressing)     Start:  03/20/22    Expected End:  02/12/23         Identify 3 triggers for depression  (Progressing)     Start:  03/20/22    Expected End:  02/12/23               Interventions: Motivational Interviewing and Supportive   Suicidal/Homicidal: Nowithout intent/plan  Therapist Response:    Pt was alert and oriented x 5. He was dressed casually and engaged well in therapy session. He presented with anxious mood/affect. He was pleasant, cooperative and maintained good eye contact.  Primary stressor for pt is illness and relationship. Pt reports he has been sick with the flu for the past week. Antwaine reports being stressed due to fear of being reprimanded at work due to missed time. Mavin reports that he does have  a doctor's excuse and is hopeful he can get back to work starting Monday.  Other stressor for pt is poor communication with girlfriend. He reports that things have been building up in his relationship and states poor timing of when they get brought up. He reports example of just before bedtime. Pt states he does not handle conflict well and does create a worse situation because of how he handles it.  Interventions/Plan: LCSW educated pt on communication skills for timing and planned communication with significant other 1 x per week. This is a time for both to be able to express, their thoughts, feeling and emotions to avoid build up. LCSW spoke with pt about coping skills such as deep breathing to help decrease anxiety and anger with harder conversations with significant other. LCSW administered a PHQ-9. LCSW administered a GAD-7. LCSW reviewed  scores with pt. LCSW spoke about trigger for anxiety and depression being his relationship   Plan: Return again in 3 weeks.  Diagnosis: Severe episode of recurrent major depressive disorder, with psychotic features ([place])  GAD (generalized anxiety disorder)  Collaboration of Care: Other None today   Patient/Guardian was advised Release of Information must be obtained prior to any record release in order to collaborate their care with an outside provider. Patient/Guardian was advised if they have not already done so to contact the registration department to sign all necessary forms in order for [place] to release information regarding their care.   Consent: Patient/Guardian gives verbal consent for treatment and assignment of benefits for services provided during this visit. Patient/Guardian expressed understanding and agreed to proceed.   Dory Horn, LCSW 01/08/2023

## 2023-01-29 ENCOUNTER — Other Ambulatory Visit (HOSPITAL_COMMUNITY): Payer: Self-pay

## 2023-01-29 ENCOUNTER — Other Ambulatory Visit: Payer: Self-pay

## 2023-02-04 ENCOUNTER — Other Ambulatory Visit: Payer: Self-pay

## 2023-02-05 ENCOUNTER — Ambulatory Visit (HOSPITAL_COMMUNITY): Payer: Medicaid Other | Admitting: Licensed Clinical Social Worker

## 2023-02-10 ENCOUNTER — Other Ambulatory Visit: Payer: Self-pay

## 2023-02-12 ENCOUNTER — Other Ambulatory Visit: Payer: Self-pay

## 2023-02-22 ENCOUNTER — Other Ambulatory Visit (HOSPITAL_BASED_OUTPATIENT_CLINIC_OR_DEPARTMENT_OTHER): Payer: Self-pay

## 2023-02-26 ENCOUNTER — Ambulatory Visit (INDEPENDENT_AMBULATORY_CARE_PROVIDER_SITE_OTHER): Payer: Medicaid Other | Admitting: Licensed Clinical Social Worker

## 2023-02-26 DIAGNOSIS — F333 Major depressive disorder, recurrent, severe with psychotic symptoms: Secondary | ICD-10-CM | POA: Diagnosis not present

## 2023-02-26 DIAGNOSIS — F411 Generalized anxiety disorder: Secondary | ICD-10-CM

## 2023-02-26 NOTE — Progress Notes (Signed)
   THERAPIST PROGRESS NOTE  Virtual Visit via Video Note  I connected with Ione on 02/26/23 at  8:00 AM EDT by a video enabled telemedicine application and verified that I am speaking with the correct person using two identifiers.  Location: Patient: [facility]  Provider: Providers Home    I discussed the limitations of evaluation and management by telemedicine and the availability of in person appointments. The patient expressed understanding and agreed to proceed.    I discussed the assessment and treatment plan with the patient. The patient was provided an opportunity to ask questions and all were answered. The patient agreed with the plan and demonstrated an understanding of the instructions.   The patient was advised to call back or seek an in-person evaluation if the symptoms worsen or if the condition fails to improve as anticipated.  I provided 45 minutes of non-face-to-face time during this encounter.   Dory Horn, LCSW   Participation Level: Active  Behavioral Response: CasualAlertAnxious and Depressed  Type of Therapy: Individual Therapy  Treatment Goals addressed: Identify 3 triggers for depression   ProgressTowards Goals: Progressing  Interventions: CBT, Motivational Interviewing, and Supportive   Suicidal/Homicidal: Nowithout intent/plan  Therapist Response:    Pt was alert and oriented x 5. He was dressed casually and engaged well in therapy session. He presented with anxious mood/affect. Kaito was pleasant, cooperative and maintained good eye contact.   Primary stressor is relationship and legal. Kashtyn reports his ex-wife wants to get back together with him. He reports happiness in his current relationship. Pt feels conflicted as this is the mother of his child. But he also feels like his current significant other and him have been really getting along.  Another stressor for pt is legal. He reports that they are continuing  to charge pt after he picked up his girlfriends' kids from school which is a violation from his last charge. Pt reports frustration as he was only trying to help his girlfriend and now could face time in jail.   Interventions/Plan: LCSW used psychoanalytic therapy for pt to express thoughts, feeling and emotions. LCSW used motivational interviewing for open ended questions, positive affirmations, and reflective listening. LCSW used empowerment for person centered therapy.    Plan: Return again in 3 weeks.  Diagnosis: Severe episode of recurrent major depressive disorder, with psychotic features ([place])  GAD (generalized anxiety disorder)  Collaboration of Care: Other None today   Patient/Guardian was advised Release of Information must be obtained prior to any record release in order to collaborate their care with an outside provider. Patient/Guardian was advised if they have not already done so to contact the registration department to sign all necessary forms in order for [place] to release information regarding their care.   Consent: Patient/Guardian gives verbal consent for treatment and assignment of benefits for services provided during this visit. Patient/Guardian expressed understanding and agreed to proceed.   Dory Horn, LCSW 02/26/2023

## 2023-03-01 ENCOUNTER — Ambulatory Visit: Payer: Medicaid Other | Admitting: Family Medicine

## 2023-03-08 ENCOUNTER — Ambulatory Visit: Payer: Medicaid Other | Admitting: Family Medicine

## 2023-03-08 ENCOUNTER — Ambulatory Visit (INDEPENDENT_AMBULATORY_CARE_PROVIDER_SITE_OTHER): Payer: Medicaid Other

## 2023-03-08 ENCOUNTER — Other Ambulatory Visit: Payer: Self-pay

## 2023-03-08 ENCOUNTER — Ambulatory Visit
Admission: EM | Admit: 2023-03-08 | Discharge: 2023-03-08 | Disposition: A | Discharge status: Home / Self Care | Payer: Medicaid Other | Attending: Urgent Care | Admitting: Urgent Care

## 2023-03-08 DIAGNOSIS — J40 Bronchitis, not specified as acute or chronic: Secondary | ICD-10-CM | POA: Diagnosis not present

## 2023-03-08 DIAGNOSIS — J329 Chronic sinusitis, unspecified: Secondary | ICD-10-CM | POA: Diagnosis not present

## 2023-03-08 DIAGNOSIS — R0602 Shortness of breath: Secondary | ICD-10-CM

## 2023-03-08 DIAGNOSIS — R059 Cough, unspecified: Secondary | ICD-10-CM | POA: Diagnosis not present

## 2023-03-08 MED ORDER — ALBUTEROL SULFATE HFA 108 (90 BASE) MCG/ACT IN AERS
2.0000 | INHALATION_SPRAY | Freq: Four times a day (QID) | RESPIRATORY_TRACT | 3 refills | Status: AC | PRN
  Filled 2023-03-08: qty 6.7, 25d supply, fill #0

## 2023-03-08 MED ORDER — PROMETHAZINE-DM 6.25-15 MG/5ML PO SYRP
5.0000 mL | ORAL_SOLUTION | Freq: Three times a day (TID) | ORAL | 0 refills | Status: AC | PRN
  Filled 2023-03-08: qty 200, 14d supply, fill #0

## 2023-03-08 MED ORDER — AMOXICILLIN-POT CLAVULANATE 875-125 MG PO TABS
1.0000 | ORAL_TABLET | Freq: Two times a day (BID) | ORAL | 0 refills | Status: DC
  Filled 2023-03-08: qty 20, 10d supply, fill #0

## 2023-03-08 MED ORDER — ALBUTEROL SULFATE HFA 108 (90 BASE) MCG/ACT IN AERS: 2.0000 | INHALATION_SPRAY | Freq: Four times a day (QID) | RESPIRATORY_TRACT | 3 refills | Status: DC | PRN

## 2023-03-08 NOTE — ED Provider Notes (Signed)
[facility] - URGENT [facility]ARE [facility]  Note:  This document was prepared using Systems analyst and may include unintentional dictation errors.  MRN: [id number] DOB: 1991/08/07  Subjective:   Ernest Mccann is a 32 y.o. male presenting for 3-week history of persistent coughing worse in the past 2 weeks.  Has had more shortness of breath, chills, sinus congestion and drainage, headaches, fatigue.  No history of asthma.  However he does have extensive family history of this.  He did respond to his brothers albuterol inhaler but needs his own.  Patient is a diabetic on insulin.  Blood sugars are not controlled.  No current facility-administered medications for this encounter.  Current Outpatient Medications:    albuterol (PROVENTIL HFA;VENTOLIN HFA) 108 (90 BASE) MCG/ACT inhaler, Inhale 2 puffs into the lungs every 6 (six) hours as needed for wheezing or shortness of breath., Disp: 3 Inhaler, Rfl: 3   Blood Glucose Monitoring Suppl (TRUE METRIX METER) w/Device KIT, Use 3 (three) times daily before meals., Disp: 1 kit, Rfl: 0   busPIRone (BUSPAR) 15 MG tablet, Take 1 tablet (15 mg total) by mouth daily., Disp: 30 tablet, Rfl: 2   FLUoxetine (PROZAC) 40 MG capsule, Take 1 capsule (40 mg total) by mouth daily., Disp: 30 capsule, Rfl: 2   gabapentin (NEURONTIN) 300 MG capsule, Take 1 capsule (300 mg total) by mouth at bedtime., Disp: 30 capsule, Rfl: 6   glucose blood (TRUE METRIX BLOOD GLUCOSE TEST) test strip, Use 3 times daily, Disp: 100 each, Rfl: 12   Insulin Glargine (BASAGLAR KWIKPEN) 100 UNIT/ML, Inject 40 Units into the skin daily., Disp: 30 mL, Rfl: 3   Insulin Pen Needle (TECHLITE PEN NEEDLES) 31G X 5 MM MISC, Use 1 pen needle each daily at bedtime., Disp: 100 each, Rfl: 5   lisinopril (ZESTRIL) 10 MG tablet, Take 1 tablet (10 mg total) by mouth daily., Disp: 30 tablet, Rfl: 6   mupirocin ointment (BACTROBAN) 2 %, Apply 1 Application topically 2 (two) times daily.  (Patient not taking: Reported on 09/09/2022), Disp: 22 g, Rfl: 0   Selenium Sulfide 2.25 % SHAM, Use 2 (two) times a week., Disp: 180 mL, Rfl: 1   Semaglutide, 1 MG/DOSE, 4 MG/3ML SOPN, Inject 1 mg as directed once a week., Disp: 3 mL, Rfl: 6   sildenafil (VIAGRA) 100 MG tablet, Take 1 tablet (100 mg total) by mouth daily as needed for erectile dysfunction. At least 24 hours between doses, Disp: 10 tablet, Rfl: 1   traZODone (DESYREL) 50 MG tablet, Take 1 tablet (50 mg total) by mouth at bedtime., Disp: 30 tablet, Rfl: 1   TRUEplus Lancets 28G MISC, Use 3 (three) times daily before meals., Disp: 100 each, Rfl: 12   No Known Allergies  Past Medical History:  Diagnosis Date   Depression    Diabetes mellitus without complication ([place])    Diabetes mellitus without complication ([place])    Type II     Past Surgical History:  Procedure Laterality Date   KNEE ARTHROPLASTY     TONSILLECTOMY      Family History  Problem Relation Age of Onset   Diabetes Mother    Diabetes Father    Heart disease Father    Renal Disease Father    Cancer Maternal Aunt    Heart disease Maternal Grandfather     Social History   Tobacco Use   Smoking status: Never   Smokeless tobacco: Never  Substance Use Topics   Alcohol use: Yes  Comment: socially   Drug use: Yes    Comment: THC    ROS   Objective:   Vitals: BP (!) 140/90 (BP Location: Left Arm)   Pulse 90   Temp 97.9 F (36.6 C) (Oral)   Resp 18   SpO2 94%   Physical Exam Constitutional:      General: He is not in acute distress.    Appearance: Normal appearance. He is well-developed and normal weight. He is not ill-appearing, toxic-appearing or diaphoretic.  HENT:     Head: Normocephalic and atraumatic.     Right Ear: Tympanic membrane, ear canal and external ear normal. No drainage, swelling or tenderness. No middle ear effusion. There is no impacted cerumen. Tympanic membrane is not erythematous or bulging.     Left Ear: Tympanic  membrane, ear canal and external ear normal. No drainage, swelling or tenderness.  No middle ear effusion. There is no impacted cerumen. Tympanic membrane is not erythematous or bulging.     Nose: Congestion and rhinorrhea present.     Mouth/Throat:     Mouth: Mucous membranes are moist.     Pharynx: No oropharyngeal exudate or posterior oropharyngeal erythema.  Eyes:     General: No scleral icterus.       Right eye: No discharge.        Left eye: No discharge.     Extraocular Movements: Extraocular movements intact.     Conjunctiva/sclera: Conjunctivae normal.  Cardiovascular:     Rate and Rhythm: Normal rate and regular rhythm.     Heart sounds: Normal heart sounds. No murmur heard.    No friction rub. No gallop.  Pulmonary:     Effort: Pulmonary effort is normal. No respiratory distress.     Breath sounds: Normal breath sounds. No stridor. No wheezing, rhonchi or rales.  Musculoskeletal:     Cervical back: Normal range of motion and neck supple. No rigidity. No muscular tenderness.  Neurological:     General: No focal deficit present.     Mental Status: He is alert and oriented to person, place, and time.     Cranial Nerves: No cranial nerve deficit.     Motor: No weakness.     Coordination: Coordination normal.     Gait: Gait normal.  Psychiatric:        Mood and Affect: Mood normal.        Behavior: Behavior normal.        Thought Content: Thought content normal.     DG Chest 2 View  Result Date: 03/08/2023 CLINICAL DATA:  Cough for 3 months that is worsening. Shortness of breath. EXAM: CHEST - 2 VIEW COMPARISON:  Chest radiograph 04/12/2021 FINDINGS: The cardiomediastinal contours are within normal limits. The lungs are clear. No pneumothorax or pleural effusion. No acute finding in the visualized skeleton. IMPRESSION: No acute cardiopulmonary finding. Electronically Signed   By: Audie Pinto M.D.   On: 03/08/2023 16:21     Assessment and Plan :   PDMP not reviewed  this encounter.  1. Sinobronchitis   2. SOB (shortness of breath)     Unfortunately will have to avoid prednisone due to his severely uncontrolled diabetes.  Recommended managing for sinobronchitis with Augmentin, supportive care, albuterol.  Counseled patient on potential for adverse effects with medications prescribed/recommended today, ER and return-to-clinic precautions discussed, patient verbalized understanding.    Jaynee Eagles, [place] 03/08/23 1712

## 2023-03-08 NOTE — ED Triage Notes (Signed)
Patient presents to UC for cough x 3 months ago, states these past two weeks he has gotten worse. Feels SOB, chills, HA, and fatigue. Treating symptoms with OTC tylenol, ibuprofen, and cough syrup.   Denies fever.

## 2023-03-19 ENCOUNTER — Ambulatory Visit (HOSPITAL_COMMUNITY): Payer: Medicaid Other | Admitting: Licensed Clinical Social Worker

## 2023-04-09 ENCOUNTER — Other Ambulatory Visit (HOSPITAL_COMMUNITY): Payer: Self-pay

## 2023-04-09 ENCOUNTER — Ambulatory Visit (INDEPENDENT_AMBULATORY_CARE_PROVIDER_SITE_OTHER): Payer: Medicaid Other | Admitting: Licensed Clinical Social Worker

## 2023-04-09 ENCOUNTER — Other Ambulatory Visit: Payer: Self-pay | Admitting: Urgent Care

## 2023-04-09 ENCOUNTER — Other Ambulatory Visit: Payer: Self-pay

## 2023-04-09 ENCOUNTER — Other Ambulatory Visit: Payer: Self-pay | Admitting: Family Medicine

## 2023-04-09 DIAGNOSIS — F333 Major depressive disorder, recurrent, severe with psychotic symptoms: Secondary | ICD-10-CM | POA: Diagnosis not present

## 2023-04-09 DIAGNOSIS — F411 Generalized anxiety disorder: Secondary | ICD-10-CM | POA: Diagnosis not present

## 2023-04-09 DIAGNOSIS — E1142 Type 2 diabetes mellitus with diabetic polyneuropathy: Secondary | ICD-10-CM

## 2023-04-09 DIAGNOSIS — N529 Male erectile dysfunction, unspecified: Secondary | ICD-10-CM

## 2023-04-09 MED ORDER — SILDENAFIL CITRATE 100 MG PO TABS
100.0000 mg | ORAL_TABLET | Freq: Every day | ORAL | 0 refills | Status: DC | PRN
  Filled 2023-04-09: qty 10, 30d supply, fill #0

## 2023-04-09 MED ORDER — OZEMPIC (1 MG/DOSE) 4 MG/3ML ~~LOC~~ SOPN
1.0000 mg | PEN_INJECTOR | SUBCUTANEOUS | 0 refills | Status: DC
  Filled 2023-04-09 (×2): qty 3, 28d supply, fill #0

## 2023-04-09 NOTE — Progress Notes (Signed)
THERAPIST PROGRESS NOTE  Virtual Visit via Video Note  I connected with Ernest Mccann on 04/09/23 at  9:00 AM EDT by a video enabled telemedicine application and verified that I am speaking with the correct person using two identifiers.  Location: Patient: [facility]  Provider: Providers Home    I discussed the limitations of evaluation and management by telemedicine and the availability of in person appointments. The patient expressed understanding and agreed to proceed.    I discussed the assessment and treatment plan with the patient. The patient was provided an opportunity to ask questions and all were answered. The patient agreed with the plan and demonstrated an understanding of the instructions.   The patient was advised to call back or seek an in-person evaluation if the symptoms worsen or if the condition fails to improve as anticipated.  I provided 45 minutes of non-face-to-face time during this encounter.   [name], LCSW   Participation Level: Active  Behavioral Response: CasualAlertAnxious and Depressed  Type of Therapy: Individual Therapy  Treatment Goals addressed:  Active     Anxiety Disorder CCP Problem  1 GAD     identify 3 triggers for anxiety  (Progressing)     Start:  03/20/22    Expected End:  09/13/23       Goal Note     Ex relationship          LTG: Patient will score less than 5 on the Generalized Anxiety Disorder 7 Scale (GAD-7) (Progressing)     Start:  03/20/22    Expected End:  09/13/23         STG: Patient will practice problem solving skills 3 times per week for the next 4 weeks (Progressing)     Start:  03/20/22    Expected End:  09/13/23           Depression CCP Problem  1 MDD       walk 2 x weekly  (Not Progressing)     Start:  03/20/22    Expected End:  09/13/23         LTG: Ernest Mccann WILL SCORE LESS THAN 10 ON THE PATIENT HEALTH QUESTIONNAIRE (PHQ-9) (Progressing)     Start:  03/20/22     Expected End:  09/13/23         STG: Ernest Mccann WILL ATTEND AT LEAST 80% OF SCHEDULED GROUP PSYCHOTHERAPY SESSIONS (Progressing)     Start:  03/20/22    Expected End:  09/13/23         STG: Ernest Mccann WILL COMPLETE AT LEAST 80% OF ASSIGNED HOMEWORK (Progressing)     Start:  03/20/22    Expected End:  09/13/23         Identify 3 triggers for depression  (Progressing)     Start:  03/20/22    Expected End:  09/13/23          Goal Note     Custody battle  Grief/loss of father              ProgressTowards Goals: Progressing  Interventions: CBT and Motivational Interviewing   Suicidal/Homicidal: Nowithout intent/plan  Therapist Response:   Pt was alert and oriented x 5. Ernest Mccann was pleasant, cooperative and maintained good eye contact. He engaged well in therapy session and was dressed casually. He presented with depressed and anxious mood/affect.   Primary stressors for pt are legal, ex relationship, and grief/loss. He reports that his ex-wanted to get back with him. Ernest Mccann  states that this was hard to hear because his daughter is very important for him but knew that his ex would not be the right fit for him. He states that his current girlfriend has been very supportive. Pt used the example of his father passing away after getting sick this past month and how she was at the hospital daily with him. Other stressor is grief/loss of losing his father. Pt reports he had to make all the decisions. Ernest Mccann reports that it was a struggle figuring out if his father wanted to be DNR or not. They decided to go with comfort care, and he went peacefully per pt.   Interventions/Plan: LCSW used psychoanalytic therapy for pt to express thoughts, feeling, and emotions. LCSW used supportive therapy for praise and encouragement. LCSW used motivational interviewing for reflective listening and open-ended questions. LCSW educated pt on the stage for grief/loss.   Plan: Return again in 3  weeks.  Diagnosis: Severe episode of recurrent major depressive disorder, with psychotic features (HCC)  GAD (generalized anxiety disorder)  Collaboration of Care: Other None today   Patient/Guardian was advised Release of Information must be obtained prior to any record release in order to collaborate their care with an outside provider. Patient/Guardian was advised if they have not already done so to contact the registration department to sign all necessary forms in order for [place] to release information regarding their care.   Consent: Patient/Guardian gives verbal consent for treatment and assignment of benefits for services provided during this visit. Patient/Guardian expressed understanding and agreed to proceed.   [name], LCSW 04/09/2023

## 2023-04-14 ENCOUNTER — Other Ambulatory Visit: Payer: Self-pay

## 2023-04-15 ENCOUNTER — Other Ambulatory Visit: Payer: Self-pay

## 2023-05-03 ENCOUNTER — Other Ambulatory Visit: Payer: Self-pay

## 2023-05-04 ENCOUNTER — Ambulatory Visit: Payer: Medicaid Other | Admitting: Family Medicine

## 2023-05-07 ENCOUNTER — Ambulatory Visit (HOSPITAL_COMMUNITY): Payer: Medicaid Other | Admitting: Licensed Clinical Social Worker

## 2023-05-11 ENCOUNTER — Other Ambulatory Visit: Payer: Self-pay

## 2023-05-18 ENCOUNTER — Other Ambulatory Visit (HOSPITAL_COMMUNITY): Payer: Self-pay

## 2023-05-18 ENCOUNTER — Other Ambulatory Visit: Payer: Self-pay | Admitting: Family Medicine

## 2023-05-18 ENCOUNTER — Other Ambulatory Visit: Payer: Self-pay

## 2023-05-18 DIAGNOSIS — N529 Male erectile dysfunction, unspecified: Secondary | ICD-10-CM

## 2023-05-18 MED ORDER — SILDENAFIL CITRATE 100 MG PO TABS
100.0000 mg | ORAL_TABLET | Freq: Every day | ORAL | 0 refills | Status: DC | PRN
Start: 2023-05-18 — End: 2023-09-07
  Filled 2023-05-18: qty 10, 30d supply, fill #0

## 2023-05-18 NOTE — Telephone Encounter (Signed)
Requested medications are due for refill today.  yes  Requested medications are on the active medications list.  yes  Last refill. 04/09/2023 #10 0 rf  Future visit scheduled.   yes  Notes to clinic.  Labs are expired.    Requested Prescriptions  Pending Prescriptions Disp Refills   sildenafil (VIAGRA) 100 MG tablet 10 tablet 0    Sig: Take 1 tablet (100 mg total) by mouth daily as needed for erectile dysfunction. At least 24 hours between doses     Urology: Erectile Dysfunction Agents Failed - 05/18/2023  2:47 PM      Failed - AST in normal range and within 360 days    AST  Date Value Ref Range Status  03/02/2022 12 0 - 40 IU/L Final         Failed - ALT in normal range and within 360 days    ALT  Date Value Ref Range Status  03/02/2022 17 0 - 44 IU/L Final         Failed - Last BP in normal range    BP Readings from Last 1 Encounters:  03/08/23 (!) 140/90         Passed - Valid encounter within last 12 months    Recent Outpatient Visits           8 months ago Type 2 diabetes mellitus with diabetic polyneuropathy, with long-term current use of insulin (HCC)   [place] [facility] [place], [place], [place]   11 months ago Type 2 diabetes mellitus with diabetic polyneuropathy, with long-term current use of insulin (HCC)   [place] [facility] [place], [place], [place]   1 year ago Type 2 diabetes mellitus with diabetic polyneuropathy, with long-term current use of insulin [facility]   [place] [facility] [place], [place], [place]   1 year ago Type 2 diabetes mellitus with diabetic polyneuropathy, with long-term current use of insulin [facility])   [place] [facility] [place], [place] L, RPH-CPP   1 year ago Type 2 diabetes mellitus with diabetic polyneuropathy, with long-term current use of insulin [facility])    [facility] [place], Cornelius Moras, RPH-CPP        Future Appointments             In 2 months [place] Benes, Binnie Rail, MD [facility]

## 2023-05-19 ENCOUNTER — Other Ambulatory Visit: Payer: Self-pay

## 2023-06-10 ENCOUNTER — Encounter (HOSPITAL_COMMUNITY): Payer: Self-pay

## 2023-06-10 ENCOUNTER — Ambulatory Visit (HOSPITAL_COMMUNITY): Payer: Medicaid Other | Admitting: Licensed Clinical Social Worker

## 2023-06-18 ENCOUNTER — Ambulatory Visit: Payer: Medicaid Other | Admitting: Internal Medicine

## 2023-06-23 ENCOUNTER — Other Ambulatory Visit: Payer: Self-pay | Admitting: Family Medicine

## 2023-06-23 DIAGNOSIS — N529 Male erectile dysfunction, unspecified: Secondary | ICD-10-CM

## 2023-06-26 ENCOUNTER — Other Ambulatory Visit (HOSPITAL_COMMUNITY): Payer: Self-pay

## 2023-06-30 ENCOUNTER — Other Ambulatory Visit: Payer: Self-pay | Admitting: Family Medicine

## 2023-06-30 DIAGNOSIS — N529 Male erectile dysfunction, unspecified: Secondary | ICD-10-CM

## 2023-07-02 ENCOUNTER — Other Ambulatory Visit (HOSPITAL_COMMUNITY): Payer: Self-pay

## 2023-07-07 ENCOUNTER — Other Ambulatory Visit: Payer: Self-pay | Admitting: Family Medicine

## 2023-07-07 DIAGNOSIS — N529 Male erectile dysfunction, unspecified: Secondary | ICD-10-CM

## 2023-07-08 ENCOUNTER — Other Ambulatory Visit (HOSPITAL_COMMUNITY): Payer: Self-pay

## 2023-07-08 NOTE — Telephone Encounter (Signed)
Unable to refill per protocol, rx request was recently refused due to OV needed.  Requested Prescriptions  Pending Prescriptions Disp Refills   sildenafil (VIAGRA) 100 MG tablet 10 tablet 0    Sig: Take 1 tablet (100 mg total) by mouth daily as needed for erectile dysfunction. At least 24 hours between doses     Urology: Erectile Dysfunction Agents Failed - 07/07/2023  6:40 PM      Failed - AST in normal range and within 360 days    AST  Date Value Ref Range Status  03/02/2022 12 0 - 40 IU/L Final         Failed - ALT in normal range and within 360 days    ALT  Date Value Ref Range Status  03/02/2022 17 0 - 44 IU/L Final         Failed - Last BP in normal range    BP Readings from Last 1 Encounters:  03/08/23 (!) 140/90         Passed - Valid encounter within last 12 months    Recent Outpatient Visits           10 months ago Type 2 diabetes mellitus with diabetic polyneuropathy, with long-term current use of insulin (HCC)   [facility] [place], [place], [place]   1 year ago Type 2 diabetes mellitus with diabetic polyneuropathy, with long-term current use of insulin (HCC)   [facility] [place], [place], [place]   1 year ago Type 2 diabetes mellitus with diabetic polyneuropathy, with long-term current use of insulin [facility])   [facility] [place], [place], [place]   1 year ago Type 2 diabetes mellitus with diabetic polyneuropathy, with long-term current use of insulin [facility])    [facility] [place], [place] L, RPH-CPP   1 year ago Type 2 diabetes mellitus with diabetic polyneuropathy, with long-term current use of insulin [facility])   [facility] [place], Cornelius Moras, RPH-CPP

## 2023-07-29 ENCOUNTER — Ambulatory Visit: Payer: Medicaid Other | Admitting: Internal Medicine

## 2023-07-30 ENCOUNTER — Ambulatory Visit: Payer: Medicaid Other | Admitting: Internal Medicine

## 2023-08-11 ENCOUNTER — Ambulatory Visit: Payer: MEDICAID | Admitting: Family Medicine

## 2023-08-26 ENCOUNTER — Ambulatory Visit: Payer: MEDICAID | Admitting: Physician Assistant

## 2023-08-28 ENCOUNTER — Other Ambulatory Visit (HOSPITAL_COMMUNITY): Payer: Self-pay

## 2023-09-07 ENCOUNTER — Encounter: Payer: Self-pay | Admitting: Family Medicine

## 2023-09-07 ENCOUNTER — Ambulatory Visit: Payer: MEDICAID | Attending: Family Medicine | Admitting: Family Medicine

## 2023-09-07 ENCOUNTER — Other Ambulatory Visit: Payer: Self-pay

## 2023-09-07 VITALS — BP 126/84 | HR 80 | Ht 72.0 in | Wt 222.4 lb

## 2023-09-07 DIAGNOSIS — N529 Male erectile dysfunction, unspecified: Secondary | ICD-10-CM | POA: Diagnosis not present

## 2023-09-07 DIAGNOSIS — E1159 Type 2 diabetes mellitus with other circulatory complications: Secondary | ICD-10-CM

## 2023-09-07 DIAGNOSIS — E1142 Type 2 diabetes mellitus with diabetic polyneuropathy: Secondary | ICD-10-CM

## 2023-09-07 DIAGNOSIS — Z794 Long term (current) use of insulin: Secondary | ICD-10-CM

## 2023-09-07 DIAGNOSIS — I152 Hypertension secondary to endocrine disorders: Secondary | ICD-10-CM

## 2023-09-07 DIAGNOSIS — Z0120 Encounter for dental examination and cleaning without abnormal findings: Secondary | ICD-10-CM

## 2023-09-07 DIAGNOSIS — Z7985 Long-term (current) use of injectable non-insulin antidiabetic drugs: Secondary | ICD-10-CM

## 2023-09-07 LAB — POCT GLYCOSYLATED HEMOGLOBIN (HGB A1C): HbA1c, POC (controlled diabetic range): 14.4 % — AB (ref 0.0–7.0)

## 2023-09-07 MED ORDER — OZEMPIC (1 MG/DOSE) 4 MG/3ML ~~LOC~~ SOPN
1.0000 mg | PEN_INJECTOR | SUBCUTANEOUS | 0 refills | Status: DC
  Filled 2023-09-07 – 2023-09-15 (×3): qty 3, 28d supply, fill #0

## 2023-09-07 MED ORDER — GABAPENTIN 300 MG PO CAPS
300.0000 mg | ORAL_CAPSULE | Freq: Every day | ORAL | 6 refills | Status: AC
Start: 2023-09-07 — End: ?
  Filled 2023-09-07 – 2023-09-15 (×2): qty 30, 30d supply, fill #0
  Filled 2024-01-05: qty 30, 30d supply, fill #1
  Filled 2024-07-05: qty 30, 30d supply, fill #2

## 2023-09-07 MED ORDER — SILDENAFIL CITRATE 100 MG PO TABS
100.0000 mg | ORAL_TABLET | Freq: Every day | ORAL | 0 refills | Status: DC | PRN
  Filled 2023-09-07 – 2023-09-15 (×3): qty 10, 10d supply, fill #0

## 2023-09-07 MED ORDER — BASAGLAR KWIKPEN 100 UNIT/ML ~~LOC~~ SOPN
50.0000 [IU] | PEN_INJECTOR | Freq: Every day | SUBCUTANEOUS | 3 refills | Status: DC
  Filled 2023-09-07 – 2023-09-15 (×2): qty 30, 60d supply, fill #0
  Filled 2023-12-09: qty 30, 60d supply, fill #1

## 2023-09-07 MED ORDER — LISINOPRIL 10 MG PO TABS
10.0000 mg | ORAL_TABLET | Freq: Every day | ORAL | 6 refills | Status: DC
  Filled 2023-09-07 – 2023-09-15 (×2): qty 30, 30d supply, fill #0
  Filled 2024-01-05: qty 30, 30d supply, fill #1

## 2023-09-07 NOTE — Patient Instructions (Signed)

## 2023-09-07 NOTE — Progress Notes (Signed)
Subjective:  Patient ID: Ernest Mccann, male    DOB: 05/17/1991  Age: 32 y.o. MRN: [id number]  CC: Medical Management of Chronic Issues (Dental and ophthalmology referral/)   HPI Ernest Mccann is a 32 y.o. year old male with a history of  type 2 diabetes mellitus (A1c 14.4), anxiety and depression, hypertension here for follow-up of chronic medical conditions.   Interval History: Discussed the use of AI scribe software for clinical note transcription with the patient, who gave verbal consent to proceed.  He presents for a routine check-up and to request referrals for an ophthalmologist and a dentist. He reports that he was diagnosed with astigmatism two years ago. He also has not had his teeth cleaned since 2010.   He has been taking Ozempic and Basaglar for his diabetes, but recently ran out of Ozempic 1 month ago and has not refilled the prescription. He has been adjusting the dosage of Basaglar in response to the absence of Ozempic, recently increasing it to 43 or 44 units. He reports that his fasting blood sugars have been consistently in the mid to low 200s.  He also takes gabapentin for neuropathy and lisinopril for hypertension.   He has a history of depression and has not been in contact with behavioral health services for some time. He has a sore on his foot from scratching due to itchiness, which he attributes to neuropathy. He also expresses a desire to start a family and has concerns about potential retrograde ejaculation as he does not ejaculate. He has been on Viagra for erectile dysfunction.       Past Medical History:  Diagnosis Date   Depression    Diabetes mellitus without complication (HCC)    Diabetes mellitus without complication (HCC)    Type II    Past Surgical History:  Procedure Laterality Date   KNEE ARTHROPLASTY     TONSILLECTOMY      Family History  Problem Relation Age of Onset   Diabetes Mother    Diabetes Father    Heart disease  Father    Renal Disease Father    Cancer Maternal Aunt    Heart disease Maternal Grandfather     Social History   Socioeconomic History   Marital status: Married    Spouse name: Not on file   Number of children: Not on file   Years of education: Not on file   Highest education level: Some college, no degree  Occupational History   Not on file  Tobacco Use   Smoking status: Never   Smokeless tobacco: Never  Substance and Sexual Activity   Alcohol use: Yes    Comment: socially   Drug use: Yes    Comment: THC   Sexual activity: Yes  Other Topics Concern   Not on file  Social History Narrative   ** Merged History Encounter **       Social Determinants of Health   Financial Resource Strain: High Risk (09/06/2023)   Overall Financial Resource Strain (CARDIA)    Difficulty of Paying Living Expenses: Hard  Food Insecurity: Food Insecurity Present (09/06/2023)   Hunger Vital Sign    Worried About Running Out of Food in the Last Year: Often true    Ran Out of Food in the Last Year: Often true  Transportation Needs: No Transportation Needs (09/06/2023)   PRAPARE - Administrator, Civil Service (Medical): No    Lack of Transportation (Non-Medical): No  Physical Activity: Insufficiently  Active (09/06/2023)   Exercise Vital Sign    Days of Exercise per Week: 2 days    Minutes of Exercise per Session: 30 min  Stress: Patient Declined (09/06/2023)   [employer] of Occupational Health - Occupational Stress Questionnaire    Feeling of Stress : Patient declined  Social Connections: Moderately Integrated (09/06/2023)   Social Connection and Isolation Panel [NHANES]    Frequency of Communication with Friends and Family: More than three times a week    Frequency of Social Gatherings with Friends and Family: Patient declined    Attends Religious Services: More than 4 times per year    Active Member of [employer] or Organizations: Yes    Attends Engineer, structural:  More than 4 times per year    Marital Status: Divorced    No Known Allergies  Outpatient Medications Prior to Visit  Medication Sig Dispense Refill   albuterol (VENTOLIN HFA) 108 (90 Base) MCG/ACT inhaler Inhale 2 puffs into the lungs every 6 (six) hours as needed for wheezing or shortness of breath. 20.1 g 3   Blood Glucose Monitoring Suppl (TRUE METRIX METER) w/Device KIT Use 3 (three) times daily before meals. 1 kit 0   glucose blood (TRUE METRIX BLOOD GLUCOSE TEST) test strip Use 3 times daily 100 each 12   Insulin Pen Needle (TECHLITE PEN NEEDLES) 31G X 5 MM MISC Use 1 pen needle each daily at bedtime. 100 each 5   mupirocin ointment (BACTROBAN) 2 % Apply 1 Application topically 2 (two) times daily. 22 g 0   promethazine-dextromethorphan (PROMETHAZINE-DM) 6.25-15 MG/5ML syrup Take 5 mLs by mouth 3 (three) times daily as needed for cough. 200 mL 0   Selenium Sulfide 2.25 % SHAM Use 2 (two) times a week. 180 mL 1   traZODone (DESYREL) 50 MG tablet Take 1 tablet (50 mg total) by mouth at bedtime. 30 tablet 1   TRUEplus Lancets 28G MISC Use 3 (three) times daily before meals. 100 each 12   amoxicillin-clavulanate (AUGMENTIN) 875-125 MG tablet Take 1 tablet by mouth 2 (two) times daily. 20 tablet 0   gabapentin (NEURONTIN) 300 MG capsule Take 1 capsule (300 mg total) by mouth at bedtime. 30 capsule 6   Insulin Glargine (BASAGLAR KWIKPEN) 100 UNIT/ML Inject 40 Units into the skin daily. 30 mL 3   lisinopril (ZESTRIL) 10 MG tablet Take 1 tablet (10 mg total) by mouth daily. 30 tablet 6   Semaglutide, 1 MG/DOSE, (OZEMPIC, 1 MG/DOSE,) 4 MG/3ML SOPN Inject 1 mg as directed once a week. 3 mL 0   sildenafil (VIAGRA) 100 MG tablet Take 1 tablet (100 mg total) by mouth daily as needed for erectile dysfunction. At least 24 hours between doses 10 tablet 0   FLUoxetine (PROZAC) 40 MG capsule Take 1 capsule (40 mg total) by mouth daily. 30 capsule 2   No facility-administered medications prior to visit.      ROS Review of Systems  Constitutional:  Negative for activity change and appetite change.  HENT:  Negative for sinus pressure and sore throat.   Respiratory:  Negative for chest tightness, shortness of breath and wheezing.   Cardiovascular:  Negative for chest pain and palpitations.  Gastrointestinal:  Negative for abdominal distention, abdominal pain and constipation.  Genitourinary: Negative.   Musculoskeletal: Negative.   Psychiatric/Behavioral:  Negative for behavioral problems and dysphoric mood.     Objective:  BP 126/84   Pulse 80   Ht 6' (1.829 m)   Wt 222  lb 6.4 oz (100.9 kg)   SpO2 96%   BMI 30.16 kg/m      09/07/2023    4:08 PM 09/07/2023    3:27 PM 03/08/2023    3:59 PM  BP/Weight  Systolic BP 126 144 140  Diastolic BP 84 93 90  Wt. (Lbs)  222.4   BMI  30.16 kg/m2       Physical Exam Constitutional:      Appearance: He is well-developed.  Cardiovascular:     Rate and Rhythm: Normal rate.     Heart sounds: Normal heart sounds. No murmur heard. Pulmonary:     Effort: Pulmonary effort is normal.     Breath sounds: Normal breath sounds. No wheezing or rales.  Chest:     Chest wall: No tenderness.  Abdominal:     General: Bowel sounds are normal. There is no distension.     Palpations: Abdomen is soft. There is no mass.     Tenderness: There is no abdominal tenderness.  Musculoskeletal:        General: Normal range of motion.     Right lower leg: No edema.     Left lower leg: No edema.  Neurological:     Mental Status: He is alert and oriented to person, place, and time.  Psychiatric:        Mood and Affect: Mood normal.        Latest Ref Rng & Units 09/09/2022   10:12 AM 06/02/2022    2:42 PM 03/02/2022   10:01 AM  CMP  Glucose 70 - 99 mg/dL 696  295  284   BUN 6 - 20 mg/dL 12  12  11    Creatinine 0.76 - 1.27 mg/dL 1.32  4.40  1.02   Sodium 134 - 144 mmol/L 136  138  135   Potassium 3.5 - 5.2 mmol/L 5.1  4.6  4.6   Chloride 96 - 106  mmol/L 97  97  98   CO2 20 - 29 mmol/L 23  24  23    Calcium 8.7 - 10.2 mg/dL 72.5  9.9  9.1   Total Protein 6.0 - 8.5 g/dL   6.5   Total Bilirubin 0.0 - 1.2 mg/dL   0.4   Alkaline Phos 44 - 121 IU/L   114   AST 0 - 40 IU/L   12   ALT 0 - 44 IU/L   17     Lipid Panel     Component Value Date/Time   CHOL 197 03/02/2022 1001   TRIG 183 (H) 03/02/2022 1001   HDL 35 (L) 03/02/2022 1001   CHOLHDL 6.8 (H) 09/19/2021 0957   CHOLHDL 4.2 08/15/2014 1211   VLDL 41 (H) 08/15/2014 1211   LDLCALC 129 (H) 03/02/2022 1001    CBC    Component Value Date/Time   WBC 4.1 04/12/2021 1534   RBC 4.86 04/12/2021 1534   HGB 13.4 04/12/2021 1534   HCT 38.3 (L) 04/12/2021 1534   PLT 294 04/12/2021 1534   MCV 78.8 (L) 04/12/2021 1534   MCH 27.6 04/12/2021 1534   MCHC 35.0 04/12/2021 1534   RDW 12.7 04/12/2021 1534   LYMPHSABS 2.5 07/02/2015 2316   MONOABS 0.4 07/02/2015 2316   EOSABS 0.2 07/02/2015 2316   BASOSABS 0.0 07/02/2015 2316    Lab Results  Component Value Date   HGBA1C 14.4 (A) 09/07/2023    Assessment & Plan:      Uncontrolled Type 2 Diabetes Mellitus A1c 14.4, fasting  glucose in mid 200s. Patient has been off Ozempic for 1 month and has been adjusting Basaglar dose. -Refer to endocrinology for further management. -Resume Ozempic 1mg . -Increase Basaglar to 50 units daily. -Check fasting blood glucose, kidney function, liver function, and urine today. -Check vitamin D level today per patient request as he states he would like to check "all his vitamins". Advised that there is no justification to order all these test at the moment and he is agreeable -Follow up in 3 months.  Hypertension Blood pressure slightly elevated today, but patient reported recent food intake. -Repeat Bp is normal -Continue Lisinopril. -Counseled on blood pressure goal of less than 130/80, low-sodium, DASH diet, medication compliance, 150 minutes of moderate intensity exercise per week. Discussed  medication compliance, adverse effects.   Depression Patient has not been in contact with behavioral health for some time. -Encourage patient to schedule appointment with behavioral health.  Neuropathy Patient reports itching and scratching leading to sores. -Continue Gabapentin. -Advise use of over-the-counter hydrocortisone for itching.  Astigmatism Patient reports worsening over the past two years. -Refer to ophthalmology for evaluation.  Dental Health Patient has not had a dental cleaning since 2010. -Refer to dentist for cleaning.  Retrograde Ejaculation/ Erectile dysfunction --Refill Sildenafil. Patient reports concern about fertility. -Refer to urology for evaluation.            Meds ordered this encounter  Medications   Insulin Glargine (BASAGLAR KWIKPEN) 100 UNIT/ML    Sig: Inject 50 Units into the skin daily.    Dispense:  30 mL    Refill:  3    Dose increase   gabapentin (NEURONTIN) 300 MG capsule    Sig: Take 1 capsule (300 mg total) by mouth at bedtime.    Dispense:  30 capsule    Refill:  6   lisinopril (ZESTRIL) 10 MG tablet    Sig: Take 1 tablet (10 mg total) by mouth daily.    Dispense:  30 tablet    Refill:  6    Dose increase   Semaglutide, 1 MG/DOSE, (OZEMPIC, 1 MG/DOSE,) 4 MG/3ML SOPN    Sig: Inject 1 mg as directed once a week.    Dispense:  3 mL    Refill:  0    Must keep upcoming appt. Has not been seen since 08/2022.   sildenafil (VIAGRA) 100 MG tablet    Sig: Take 1 tablet (100 mg total) by mouth daily as needed for erectile dysfunction. At least 24 hours between doses    Dispense:  10 tablet    Refill:  0    Follow-up: Return in about 3 months (around 12/07/2023) for Chronic medical conditions.       Hoy Register, MD, FAAFP. [facility] [place], [place] [phone]   09/07/2023, 5:24 PM

## 2023-09-08 ENCOUNTER — Other Ambulatory Visit: Payer: Self-pay

## 2023-09-08 ENCOUNTER — Other Ambulatory Visit (HOSPITAL_COMMUNITY): Payer: Self-pay

## 2023-09-08 ENCOUNTER — Other Ambulatory Visit: Payer: Self-pay | Admitting: Family Medicine

## 2023-09-08 LAB — CBC WITH DIFFERENTIAL/PLATELET
Basophils Absolute: 0 10*3/uL (ref 0.0–0.2)
Basos: 0 %
EOS (ABSOLUTE): 0.2 10*3/uL (ref 0.0–0.4)
Eos: 4 %
Hematocrit: 41.5 % (ref 37.5–51.0)
Hemoglobin: 13.4 g/dL (ref 13.0–17.7)
Immature Grans (Abs): 0 10*3/uL (ref 0.0–0.1)
Immature Granulocytes: 0 %
Lymphocytes Absolute: 2.6 10*3/uL (ref 0.7–3.1)
Lymphs: 43 %
MCH: 27.6 pg (ref 26.6–33.0)
MCHC: 32.3 g/dL (ref 31.5–35.7)
MCV: 86 fL (ref 79–97)
Monocytes Absolute: 0.4 10*3/uL (ref 0.1–0.9)
Monocytes: 7 %
Neutrophils Absolute: 2.7 10*3/uL (ref 1.4–7.0)
Neutrophils: 46 %
Platelets: 388 10*3/uL (ref 150–450)
RBC: 4.85 x10E6/uL (ref 4.14–5.80)
RDW: 12.6 % (ref 11.6–15.4)
WBC: 5.9 10*3/uL (ref 3.4–10.8)

## 2023-09-08 LAB — CMP14+EGFR
ALT: 26 IU/L (ref 0–44)
AST: 15 IU/L (ref 0–40)
Albumin: 4.3 g/dL (ref 4.1–5.1)
Alkaline Phosphatase: 113 IU/L (ref 44–121)
BUN/Creatinine Ratio: 16 (ref 9–20)
BUN: 12 mg/dL (ref 6–20)
Bilirubin Total: 0.2 mg/dL (ref 0.0–1.2)
CO2: 23 mmol/L (ref 20–29)
Calcium: 9.3 mg/dL (ref 8.7–10.2)
Chloride: 97 mmol/L (ref 96–106)
Creatinine, Ser: 0.76 mg/dL (ref 0.76–1.27)
Globulin, Total: 2.4 g/dL (ref 1.5–4.5)
Glucose: 414 mg/dL — ABNORMAL HIGH (ref 70–99)
Potassium: 4.4 mmol/L (ref 3.5–5.2)
Sodium: 138 mmol/L (ref 134–144)
Total Protein: 6.7 g/dL (ref 6.0–8.5)
eGFR: 123 mL/min/{1.73_m2} (ref >59)

## 2023-09-08 LAB — MICROALBUMIN / CREATININE URINE RATIO
Creatinine, Urine: 39.8 mg/dL
Microalb/Creat Ratio: 122 mg/g creat — ABNORMAL HIGH (ref 0–29)
Microalbumin, Urine: 48.7 ug/mL

## 2023-09-08 LAB — VITAMIN D 25 HYDROXY (VIT D DEFICIENCY, FRACTURES): Vit D, 25-Hydroxy: 16.6 ng/mL — ABNORMAL LOW (ref 30.0–100.0)

## 2023-09-08 MED ORDER — ERGOCALCIFEROL 1.25 MG (50000 UT) PO CAPS
50000.0000 [IU] | ORAL_CAPSULE | ORAL | 0 refills | Status: DC
  Filled 2023-09-08 – 2023-09-15 (×3): qty 12, 84d supply, fill #0

## 2023-09-09 ENCOUNTER — Other Ambulatory Visit: Payer: Self-pay | Admitting: Family Medicine

## 2023-09-09 ENCOUNTER — Other Ambulatory Visit: Payer: Self-pay

## 2023-09-09 MED ORDER — DAPAGLIFLOZIN PROPANEDIOL 10 MG PO TABS
10.0000 mg | ORAL_TABLET | Freq: Every day | ORAL | 3 refills | Status: DC
  Filled 2023-09-09 – 2023-09-17 (×4): qty 30, 30d supply, fill #0
  Filled 2023-12-09: qty 30, 30d supply, fill #1
  Filled 2024-01-05: qty 30, 30d supply, fill #2
  Filled 2024-02-03: qty 30, 30d supply, fill #3

## 2023-09-10 ENCOUNTER — Other Ambulatory Visit: Payer: Self-pay

## 2023-09-13 ENCOUNTER — Other Ambulatory Visit: Payer: Self-pay

## 2023-09-13 ENCOUNTER — Ambulatory Visit
Admission: EM | Admit: 2023-09-13 | Discharge: 2023-09-13 | Disposition: A | Discharge status: Home / Self Care | Payer: MEDICAID | Attending: Internal Medicine | Admitting: Internal Medicine

## 2023-09-13 ENCOUNTER — Other Ambulatory Visit (HOSPITAL_COMMUNITY): Payer: Self-pay

## 2023-09-13 DIAGNOSIS — M545 Low back pain, unspecified: Secondary | ICD-10-CM

## 2023-09-13 MED ORDER — KETOROLAC TROMETHAMINE 30 MG/ML IJ SOLN
30.0000 mg | Freq: Once | INTRAMUSCULAR | Status: AC
  Administered 2023-09-13: 30 mg via INTRAMUSCULAR

## 2023-09-13 MED ORDER — CYCLOBENZAPRINE HCL 10 MG PO TABS
10.0000 mg | ORAL_TABLET | Freq: Two times a day (BID) | ORAL | 0 refills | Status: DC | PRN
Start: 2023-09-13 — End: 2023-12-14
  Filled 2023-09-13 (×4): qty 10, 5d supply, fill #0

## 2023-09-13 NOTE — ED Triage Notes (Signed)
Pt presents to UC w/ c/o lower back pain x1 month. Pt denies falling, direct injury, or lifting heavy objects. Pt reports it has affected his sleep. He has tried icyhot patches and naproxen otc w/o relief.

## 2023-09-13 NOTE — Discharge Instructions (Signed)
You were given a Toradol injection in clinic today. Do not take any over the counter NSAID's such as Advil, ibuprofen, Aleve, or naproxen for 24 hours. You may take tylenol if needed.  You may take Flexeril as needed.  Please this medication can make you drowsy.  Do not drink alcohol or drive on this medication.  Heat and rest.  Please follow-up with your PCP in 2 days for recheck.  Please go to the ER for any worsening symptoms.  I hope you feel better soon!

## 2023-09-13 NOTE — ED Provider Notes (Addendum)
[facility]    CSN: [id number] Arrival date & time: 09/13/23  0930      History   Chief Complaint No chief complaint on file.   HPI Ernest Mccann is a 32 y.o. male presents for back pain.  Patient reports 1 week of a persistent nonradiating bilateral lower back pain that he describes as an aching type pain.  Denies any known injury or inciting event.  Denies any numbness/tingling/weakness of his lower extremities, no bowel or bladder incontinence, no saddle paresthesia.  No history of back injury/surgeries.  He has been taking OTC naproxen and IcyHot without improvement.  He has not taken any medications today for his symptoms.  No other concerns at this time.  HPI  Past Medical History:  Diagnosis Date   Depression    Diabetes mellitus without complication (HCC)    Diabetes mellitus without complication (HCC)    Type II    Patient Active Problem List   Diagnosis Date Noted   Insomnia 01/27/2022   Severe episode of recurrent major depressive disorder, with psychotic features (HCC) 11/12/2021   GAD (generalized anxiety disorder) 11/12/2021   PTSD (post-traumatic stress disorder) 11/12/2021   HTN (hypertension) 01/08/2016   Adjustment disorder with depressed mood 07/03/2015   Type 2 diabetes mellitus with hyperglycemia, with long-term current use of insulin (HCC) 08/15/2014   Routine general medical examination at a health care facility 06/12/2013    Past Surgical History:  Procedure Laterality Date   KNEE ARTHROPLASTY     TONSILLECTOMY         Home Medications    Prior to Admission medications   Medication Sig Start Date End Date Taking? Authorizing Provider  cyclobenzaprine (FLEXERIL) 10 MG tablet Take 1 tablet (10 mg total) by mouth 2 (two) times daily as needed for muscle spasms. 09/13/23  Yes Radford Pax, NP  lisinopril (ZESTRIL) 10 MG tablet Take 1 tablet (10 mg total) by mouth daily. 09/07/23  Yes Hoy Register, MD  albuterol (VENTOLIN HFA)  108 (90 Base) MCG/ACT inhaler Inhale 2 puffs into the lungs every 6 (six) hours as needed for wheezing or shortness of breath. 03/08/23   Wallis Bamberg, PA-C  Blood Glucose Monitoring Suppl (TRUE METRIX METER) w/Device KIT Use 3 (three) times daily before meals. 09/09/22   Hoy Register, MD  dapagliflozin propanediol (FARXIGA) 10 MG TABS tablet Take 1 tablet (10 mg total) by mouth daily before breakfast. 09/09/23   Hoy Register, MD  ergocalciferol (DRISDOL) 1.25 MG (50000 UT) capsule Take 1 capsule (50,000 Units total) by mouth once a week. 09/08/23   Hoy Register, MD  FLUoxetine (PROZAC) 40 MG capsule Take 1 capsule (40 mg total) by mouth daily. 05/19/22 05/19/23  Nwoko, Tommas Olp, PA  gabapentin (NEURONTIN) 300 MG capsule Take 1 capsule (300 mg total) by mouth at bedtime. 09/07/23   Hoy Register, MD  glucose blood (TRUE METRIX BLOOD GLUCOSE TEST) test strip Use 3 times daily 09/09/22   Hoy Register, MD  Insulin Glargine (BASAGLAR KWIKPEN) 100 UNIT/ML Inject 50 Units into the skin daily. 09/07/23   Hoy Register, MD  Insulin Pen Needle (TECHLITE PEN NEEDLES) 31G X 5 MM MISC Use 1 pen needle each daily at bedtime. 09/11/22   Hoy Register, MD  mupirocin ointment (BACTROBAN) 2 % Apply 1 Application topically 2 (two) times daily. 06/24/22   Mayers, Cari S, PA-C  promethazine-dextromethorphan (PROMETHAZINE-DM) 6.25-15 MG/5ML syrup Take 5 mLs by mouth 3 (three) times daily as needed for cough. 03/08/23  Wallis Bamberg, PA-C  Selenium Sulfide 2.25 % SHAM Use 2 (two) times a week. 03/02/22   Hoy Register, MD  Semaglutide, 1 MG/DOSE, (OZEMPIC, 1 MG/DOSE,) 4 MG/3ML SOPN Inject 1 mg as directed once a week. 09/07/23   Hoy Register, MD  sildenafil (VIAGRA) 100 MG tablet Take 1 tablet (100 mg total) by mouth daily as needed for erectile dysfunction. At least 24 hours between doses 09/07/23   Hoy Register, MD  traZODone (DESYREL) 50 MG tablet Take 1 tablet (50 mg total) by mouth at bedtime. 03/17/22   Nwoko,  Tommas Olp, PA  TRUEplus Lancets 28G MISC Use 3 (three) times daily before meals. 09/09/22   Hoy Register, MD    Family History Family History  Problem Relation Age of Onset   Diabetes Mother    Diabetes Father    Heart disease Father    Renal Disease Father    Cancer Maternal Aunt    Heart disease Maternal Grandfather     Social History Social History   Tobacco Use   Smoking status: Never   Smokeless tobacco: Never  Substance Use Topics   Alcohol use: Yes    Comment: socially   Drug use: Yes    Comment: THC     Allergies   Patient has no known allergies.   Review of Systems Review of Systems  Musculoskeletal:  Positive for back pain.     Physical Exam Triage Vital Signs ED Triage Vitals  Encounter Vitals Group     BP 09/13/23 0941 (!) 144/95     Systolic BP Percentile --      Diastolic BP Percentile --      Pulse Rate 09/13/23 0941 87     Resp 09/13/23 0941 16     Temp 09/13/23 0941 98 F (36.7 C)     Temp Source 09/13/23 0941 Oral     SpO2 09/13/23 0941 97 %     Weight --      Height --      Head Circumference --      Peak Flow --      Pain Score 09/13/23 0945 10     Pain Loc --      Pain Education --      Exclude from Growth Chart --    No data found.  Updated Vital Signs BP (!) 144/95 (BP Location: Left Arm)   Pulse 87   Temp 98 F (36.7 C) (Oral)   Resp 16   SpO2 97%   Visual Acuity Right Eye Distance:   Left Eye Distance:   Bilateral Distance:    Right Eye Near:   Left Eye Near:    Bilateral Near:     Physical Exam Vitals and nursing note reviewed.  Constitutional:      General: He is not in acute distress.    Appearance: Normal appearance. He is not ill-appearing, toxic-appearing or diaphoretic.  HENT:     Head: Normocephalic and atraumatic.  Eyes:     Pupils: Pupils are equal, round, and reactive to light.  Cardiovascular:     Rate and Rhythm: Normal rate.  Pulmonary:     Effort: Pulmonary effort is normal.   Musculoskeletal:     Cervical back: Normal.     Thoracic back: Normal.     Lumbar back: Spasms and tenderness present. No swelling, edema, deformity, signs of trauma, lacerations or bony tenderness. Normal range of motion. Negative right straight leg raise test and negative left straight leg raise  test. No scoliosis.       Back:     Comments: Strength 5 out of 5 bilateral lower extremities.  Skin:    General: Skin is warm and dry.  Neurological:     General: No focal deficit present.     Mental Status: He is alert and oriented to person, place, and time.  Psychiatric:        Mood and Affect: Mood normal.        Behavior: Behavior normal.      UC Treatments / Results  Labs (all labs ordered are listed, but only abnormal results are displayed) Labs Reviewed - No data to display  CMP14+EGFR Order: [id number] Status: Final result     Visible to patient: Yes (seen)     Next appt: 09/20/2023 at 10:30 AM in Urology Joline Maxcy, MD)     Dx: Type 2 diabetes mellitus with diabeti...   0 Result Notes     2 Patient Communications     1 HM Topic          Component Ref Range & Units 6 d ago (09/07/23) 1 yr ago (09/09/22) 1 yr ago (06/02/22) 1 yr ago (03/02/22) 1 yr ago (09/19/21) 2 yr ago (04/12/21) 8 yr ago (07/02/15)  Glucose 70 - 99 mg/dL 469 High  629 High  528 High  361 High  370 High  CM 478 High  CM 419 High  R  BUN 6 - 20 mg/dL 12 12 12 11 17 14 13   Creatinine, Ser 0.76 - 1.27 mg/dL 4.13 2.44 0.10 2.72 Low  0.72 Low  0.70 R 0.84 R  eGFR >59 mL/min/1.73 123 123 123 126 127    BUN/Creatinine Ratio 9 - 20 16 15 15 15 24  High     Sodium 134 - 144 mmol/L 138 136 138 135 135 134 Low  R 136 R  Potassium 3.5 - 5.2 mmol/L 4.4 5.1 4.6 4.6 4.8 4.0 R 4.1 R  Chloride 96 - 106 mmol/L 97 97 97 98 97 100 R 104 R  CO2 20 - 29 mmol/L 23 23 24 23 22 25  R 26 R  Calcium 8.7 - 10.2 mg/dL 9.3 53.6 High  9.9 9.1 9.9 8.7 Low  R 9.0 R  Total Protein 6.0 - 8.5 g/dL 6.7   6.5 6.8     Albumin 4.1 - 5.1 g/dL 4.3   4.6 R 4.8 R    Globulin, Total 1.5 - 4.5 g/dL 2.4   1.9 2.0    Bilirubin Total 0.0 - 1.2 mg/dL 0.2   0.4 0.3    Alkaline Phosphatase 44 - 121 IU/L 113   114 121    AST 0 - 40 IU/L 15   12 10     ALT 0 - 44 IU/L 26   17 19     Resulting Agency [facility]         Narrative Performed by: Verdell Carmine Performed at:  [address] [facility] [address] [place], [place]  [id number] Lab Director: Jolene Schimke MD, Phone:  [phone]    Specimen Collected: 09/07/23 16:24 Last Resulted: 09/08/23 16:36         EKG   Radiology No results found.  Procedures Procedures (including critical care time)  Medications Ordered in UC Medications  ketorolac (TORADOL) 30 MG/ML injection 30 mg (30 mg Intramuscular Given 09/13/23 0956)    Initial Impression / Assessment and Plan / UC Course  I have reviewed the triage vital signs and the nursing notes.  Pertinent labs & imaging results that were available during my care of the patient were reviewed by me and considered in my medical decision making (see chart for details).     Reviewed exam and symptoms with patient.  No red flags.  Patient given Toradol injection in clinic.  Monitored for 10 minutes after injection with no reaction noted and tolerated well.  Instructed no NSAIDs for 24 hours and verbalized understanding.  Trial of Flexeril.  Side effect profile reviewed.  Discussed heat and rest.  PCP follow-up 2 days for recheck.  ER precautions reviewed. Final Clinical Impressions(s) / UC Diagnoses   Final diagnoses:  Acute bilateral low back pain without sciatica     Discharge Instructions      You were given a Toradol injection in clinic today. Do not take any over the counter NSAID's such as Advil, ibuprofen, Aleve, or naproxen for 24 hours. You may take tylenol if needed.  You may take Flexeril as needed.  Please this medication can make you  drowsy.  Do not drink alcohol or drive on this medication.  Heat and rest.  Please follow-up with your PCP in 2 days for recheck.  Please go to the ER for any worsening symptoms.  I hope you feel better soon!      ED Prescriptions     Medication Sig Dispense Auth. Provider   cyclobenzaprine (FLEXERIL) 10 MG tablet Take 1 tablet (10 mg total) by mouth 2 (two) times daily as needed for muscle spasms. 10 tablet Radford Pax, NP      PDMP not reviewed this encounter.   Radford Pax, NP 09/13/23 1013    Radford Pax, NP 09/13/23 1014

## 2023-09-14 ENCOUNTER — Other Ambulatory Visit: Payer: Self-pay

## 2023-09-16 ENCOUNTER — Other Ambulatory Visit (HOSPITAL_BASED_OUTPATIENT_CLINIC_OR_DEPARTMENT_OTHER): Payer: Self-pay

## 2023-09-16 ENCOUNTER — Other Ambulatory Visit: Payer: Self-pay

## 2023-09-16 ENCOUNTER — Other Ambulatory Visit (HOSPITAL_COMMUNITY): Payer: Self-pay

## 2023-09-17 ENCOUNTER — Other Ambulatory Visit: Payer: Self-pay

## 2023-09-17 ENCOUNTER — Other Ambulatory Visit (HOSPITAL_COMMUNITY): Payer: Self-pay

## 2023-09-20 ENCOUNTER — Ambulatory Visit (INDEPENDENT_AMBULATORY_CARE_PROVIDER_SITE_OTHER): Payer: MEDICAID | Admitting: Urology

## 2023-09-20 ENCOUNTER — Encounter: Payer: Self-pay | Admitting: Urology

## 2023-09-20 VITALS — BP 138/94 | HR 93 | Ht 72.0 in | Wt 220.0 lb

## 2023-09-20 DIAGNOSIS — N529 Male erectile dysfunction, unspecified: Secondary | ICD-10-CM | POA: Diagnosis not present

## 2023-09-20 DIAGNOSIS — N469 Male infertility, unspecified: Secondary | ICD-10-CM | POA: Diagnosis not present

## 2023-09-20 DIAGNOSIS — N5319 Other ejaculatory dysfunction: Secondary | ICD-10-CM

## 2023-09-20 NOTE — Progress Notes (Signed)
   Assessment: 1. Erectile dysfunction of organic origin   2. Other ejaculatory dysfunction   3. Infertility male      Plan: Today I had a long discussion with the patient and his fiance regarding his ED as well as ejaculatory dysfunction and resulting infertility.  At this time it is unclear whether he is retrograde ejaculation or an ejaculation.  This is likely due to his poorly controlled diabetes. They are interested in infertility evaluation. Will refer to Dr. Lafonda Mosses at [facility] urology for infertility evaluation Follow-up as needed  Chief Complaint: ED/ejaculatory dysfunction/infertility  History of Present Illness:  Ernest Mccann is a 32 y.o. male with a significant past medical history of poorly controlled insulin-dependent type 2 diabetes mellitus (A1c 11.8), anxiety and depression, hypertension who is seen in consultation from Hoy Register, MD for evaluation of ED. Actually when discussing this issue with the patient he is really here because of an infertility issue.  He is here along with his fiance today they have had unprotected intercourse for over a year and want to have children.  Interestingly, the patient reports that he has a several year history of not ejaculating during orgasm.  ED is not really much of an issue.  He does not always need medication but uses sildenafil on occasion and it appears to work well.    Past Medical History:  Past Medical History:  Diagnosis Date   Depression    Diabetes mellitus without complication (HCC)    Diabetes mellitus without complication (HCC)    Type II    Past Surgical History:  Past Surgical History:  Procedure Laterality Date   KNEE ARTHROPLASTY     TONSILLECTOMY      Allergies:  No Known Allergies  Family History:  Family History  Problem Relation Age of Onset   Diabetes Mother    Diabetes Father    Heart disease Father    Renal Disease Father    Cancer Maternal Aunt    Heart disease Maternal  Grandfather     Social History:  Social History   Tobacco Use   Smoking status: Never   Smokeless tobacco: Never  Substance Use Topics   Alcohol use: Yes    Comment: socially   Drug use: Yes    Comment: THC    Review of symptoms:  Constitutional:  Negative for unexplained weight loss, night sweats, fever, chills ENT:  Negative for nose bleeds, sinus pain, painful swallowing CV:  Negative for chest pain, shortness of breath, exercise intolerance, palpitations, loss of consciousness Resp:  Negative for cough, wheezing, shortness of breath GI:  Negative for nausea, vomiting, diarrhea, bloody stools GU:  Positives noted in HPI; otherwise negative for gross hematuria, dysuria, urinary incontinence Neuro:  Negative for seizures, poor balance, limb weakness, slurred speech Psych:  Negative for lack of energy, depression, anxiety Endocrine:  Negative for polydipsia, polyuria, symptoms of hypoglycemia (dizziness, hunger, sweating) Hematologic:  Negative for anemia, purpura, petechia, prolonged or excessive bleeding, use of anticoagulants  Allergic:  Negative for difficulty breathing or choking as a result of exposure to anything; no shellfish allergy; no allergic response (rash/itch) to materials, foods  Physical exam: BP (!) 138/94   Pulse 93   Ht 6' (1.829 m)   Wt 220 lb (99.8 kg)   BMI 29.84 kg/m  GENERAL APPEARANCE:  Well appearing, well developed, well nourished, NAD

## 2023-09-24 ENCOUNTER — Other Ambulatory Visit: Payer: Self-pay

## 2023-10-19 ENCOUNTER — Telehealth: Payer: Self-pay

## 2023-10-19 NOTE — Telephone Encounter (Signed)
[employer] form has been faxed and patient has been informed to pick up copy.

## 2023-12-09 ENCOUNTER — Other Ambulatory Visit (HOSPITAL_COMMUNITY): Payer: Self-pay

## 2023-12-09 ENCOUNTER — Other Ambulatory Visit: Payer: Self-pay | Admitting: Family Medicine

## 2023-12-09 DIAGNOSIS — E1142 Type 2 diabetes mellitus with diabetic polyneuropathy: Secondary | ICD-10-CM

## 2023-12-09 DIAGNOSIS — L219 Seborrheic dermatitis, unspecified: Secondary | ICD-10-CM

## 2023-12-09 DIAGNOSIS — N529 Male erectile dysfunction, unspecified: Secondary | ICD-10-CM

## 2023-12-09 MED ORDER — OZEMPIC (1 MG/DOSE) 4 MG/3ML ~~LOC~~ SOPN
1.0000 mg | PEN_INJECTOR | SUBCUTANEOUS | 0 refills | Status: DC
  Filled 2023-12-09: qty 3, 28d supply, fill #0

## 2023-12-09 MED ORDER — SELENIUM SULFIDE 2.25 % EX SHAM
1.0000 | MEDICATED_SHAMPOO | CUTANEOUS | 0 refills | Status: DC
  Filled 2023-12-09: qty 180, 25d supply, fill #0

## 2023-12-09 MED ORDER — SILDENAFIL CITRATE 100 MG PO TABS
100.0000 mg | ORAL_TABLET | Freq: Every day | ORAL | 0 refills | Status: DC | PRN
  Filled 2023-12-09: qty 10, 10d supply, fill #0

## 2023-12-10 ENCOUNTER — Other Ambulatory Visit: Payer: Self-pay

## 2023-12-10 ENCOUNTER — Other Ambulatory Visit (HOSPITAL_COMMUNITY): Payer: Self-pay

## 2023-12-14 ENCOUNTER — Other Ambulatory Visit: Payer: Self-pay

## 2023-12-14 ENCOUNTER — Ambulatory Visit: Payer: MEDICAID | Attending: Family Medicine | Admitting: Family Medicine

## 2023-12-14 ENCOUNTER — Encounter: Payer: Self-pay | Admitting: Family Medicine

## 2023-12-14 VITALS — BP 157/108 | HR 82 | Ht 72.0 in | Wt 226.2 lb

## 2023-12-14 DIAGNOSIS — Z794 Long term (current) use of insulin: Secondary | ICD-10-CM | POA: Diagnosis not present

## 2023-12-14 DIAGNOSIS — E1159 Type 2 diabetes mellitus with other circulatory complications: Secondary | ICD-10-CM | POA: Diagnosis not present

## 2023-12-14 DIAGNOSIS — R197 Diarrhea, unspecified: Secondary | ICD-10-CM | POA: Diagnosis not present

## 2023-12-14 DIAGNOSIS — E1142 Type 2 diabetes mellitus with diabetic polyneuropathy: Secondary | ICD-10-CM | POA: Diagnosis not present

## 2023-12-14 DIAGNOSIS — I152 Hypertension secondary to endocrine disorders: Secondary | ICD-10-CM

## 2023-12-14 DIAGNOSIS — M545 Low back pain, unspecified: Secondary | ICD-10-CM | POA: Diagnosis not present

## 2023-12-14 LAB — POCT GLYCOSYLATED HEMOGLOBIN (HGB A1C): HbA1c, POC (controlled diabetic range): 10.2 % — AB (ref 0.0–7.0)

## 2023-12-14 MED ORDER — BASAGLAR KWIKPEN 100 UNIT/ML ~~LOC~~ SOPN
55.0000 [IU] | PEN_INJECTOR | Freq: Every day | SUBCUTANEOUS | 3 refills | Status: DC
Start: 2023-12-14 — End: 2024-10-10
  Filled 2023-12-14 – 2024-02-03 (×3): qty 30, 54d supply, fill #0
  Filled 2024-04-05: qty 15, 28d supply, fill #1
  Filled 2024-07-05: qty 30, 54d supply, fill #1

## 2023-12-14 MED ORDER — SEMAGLUTIDE (2 MG/DOSE) 8 MG/3ML ~~LOC~~ SOPN
2.0000 mg | PEN_INJECTOR | SUBCUTANEOUS | 1 refills | Status: DC
  Filled 2023-12-14: qty 3, 28d supply, fill #0

## 2023-12-14 MED ORDER — CYCLOBENZAPRINE HCL 10 MG PO TABS
10.0000 mg | ORAL_TABLET | Freq: Two times a day (BID) | ORAL | 1 refills | Status: AC | PRN
  Filled 2023-12-14: qty 30, 15d supply, fill #0

## 2023-12-14 NOTE — Progress Notes (Signed)
 Subjective:  Patient ID: Ernest Mccann, male    DOB: 04-02-1991  Age: 32 y.o. MRN: [id number]  CC: Medical Management of Chronic Issues (Referral to gastro/)   HPI Ernest Mccann is a 32 y.o. year old male with a history of  type 2 diabetes mellitus (A1c 14.4), anxiety and depression, hypertension here for follow-up of chronic medical conditions.   Interval History: Discussed the use of AI scribe software for clinical note transcription with the patient, who gave verbal consent to proceed.  He presents with chronic diarrhea and incontinence that has been ongoing since 2022. The episodes occur both during sleep and while awake, with the patient describing a sudden urge to defecate. The patient reports that the diarrhea is often accompanied by excessive burping, sometimes with a sulfur-like smell. The patient has been managing the symptoms with liquid Imodium. The patient also mentions a family history of gastritis and a cousin with Crohn's disease. The patient denies any blood in the stool but describes it as mucousy, similar to bile.  He tested negative for H. pylori in the past.  The patient also reports elevated blood pressure and admits to not taking his medication due to misplacing it during a recent move. The patient assures that the medication is available and will be taken once found. The patient also mentions a need for an eye exam and a referral to an ophthalmologist.  Regarding his diabetes, the patient reports an improvement in his A1c levels from 14.4 to 10.2. The patient is currently on Ozempic , Farxiga , and insulin , with the insulin  dosage being 50 units. The patient reports that his blood sugar levels have been between 150 and 250. The patient also mentions back soreness and a cough due to the season, for which he may request a muscle relaxer.        Past Medical History:  Diagnosis Date   Depression    Diabetes mellitus without complication (HCC)    Diabetes  mellitus without complication (HCC)    Type II    Past Surgical History:  Procedure Laterality Date   KNEE ARTHROPLASTY     TONSILLECTOMY      Family History  Problem Relation Age of Onset   Diabetes Mother    Diabetes Father    Heart disease Father    Renal Disease Father    Cancer Maternal Aunt    Heart disease Maternal Grandfather     Social History   Socioeconomic History   Marital status: Married    Spouse name: Not on file   Number of children: Not on file   Years of education: Not on file   Highest education level: Some college, no degree  Occupational History   Not on file  Tobacco Use   Smoking status: Never   Smokeless tobacco: Never  Substance and Sexual Activity   Alcohol use: Yes    Comment: socially   Drug use: Yes    Comment: THC   Sexual activity: Yes  Other Topics Concern   Not on file  Social History Narrative   ** Merged History Encounter **       Social Drivers of Health   Financial Resource Strain: Medium Risk (12/13/2023)   Overall Financial Resource Strain (CARDIA)    Difficulty of Paying Living Expenses: Somewhat hard  Food Insecurity: Food Insecurity Present (12/13/2023)   Hunger Vital Sign    Worried About Running Out of Food in the Last Year: Often true    Ran Out  of Food in the Last Year: Sometimes true  Transportation Needs: No Transportation Needs (12/13/2023)   PRAPARE - Administrator, Civil Service (Medical): No    Lack of Transportation (Non-Medical): No  Physical Activity: Inactive (12/13/2023)   Exercise Vital Sign    Days of Exercise per Week: 0 days    Minutes of Exercise per Session: 30 min  Stress: No Stress Concern Present (12/13/2023)   [employer] of Occupational Health - Occupational Stress Questionnaire    Feeling of Stress : Only a little  Social Connections: Moderately Integrated (12/13/2023)   Social Connection and Isolation Panel [NHANES]    Frequency of Communication with Friends and  Family: More than three times a week    Frequency of Social Gatherings with Friends and Family: Twice a week    Attends Religious Services: More than 4 times per year    Active Member of [employer] or Organizations: Yes    Attends Engineer, Structural: More than 4 times per year    Marital Status: Divorced    No Known Allergies  Outpatient Medications Prior to Visit  Medication Sig Dispense Refill   albuterol  (VENTOLIN  HFA) 108 (90 Base) MCG/ACT inhaler Inhale 2 puffs into the lungs every 6 (six) hours as needed for wheezing or shortness of breath. 20.1 g 3   Blood Glucose Monitoring Suppl (TRUE METRIX METER) w/Device KIT Use 3 (three) times daily before meals. 1 kit 0   dapagliflozin  propanediol (FARXIGA ) 10 MG TABS tablet Take 1 tablet (10 mg total) by mouth daily before breakfast. 30 tablet 3   ergocalciferol  (DRISDOL ) 1.25 MG (50000 UT) capsule Take 1 capsule (50,000 Units total) by mouth once a week. 12 capsule 0   gabapentin  (NEURONTIN ) 300 MG capsule Take 1 capsule (300 mg total) by mouth at bedtime. 30 capsule 6   glucose blood (TRUE METRIX BLOOD GLUCOSE TEST) test strip Use 3 times daily 100 each 12   Insulin  Pen Needle (TECHLITE PEN NEEDLES) 31G X 5 MM MISC Use 1 pen needle each daily at bedtime. 100 each 5   lisinopril  (ZESTRIL ) 10 MG tablet Take 1 tablet (10 mg total) by mouth daily. 30 tablet 6   mupirocin  ointment (BACTROBAN ) 2 % Apply 1 Application topically 2 (two) times daily. 22 g 0   promethazine -dextromethorphan (PROMETHAZINE -DM) 6.25-15 MG/5ML syrup Take 5 mLs by mouth 3 (three) times daily as needed for cough. 200 mL 0   Selenium  Sulfide 2.25 % SHAM Use 2 (two) times a week. 180 mL 0   sildenafil  (VIAGRA ) 100 MG tablet Take 1 tablet (100 mg total) by mouth daily as needed for erectile dysfunction. At least 24 hours between doses 10 tablet 0   traZODone  (DESYREL ) 50 MG tablet Take 1 tablet (50 mg total) by mouth at bedtime. 30 tablet 1   TRUEplus Lancets 28G MISC Use  3 (three) times daily before meals. 100 each 12   cyclobenzaprine  (FLEXERIL ) 10 MG tablet Take 1 tablet (10 mg total) by mouth 2 (two) times daily as needed for muscle spasms. 10 tablet 0   Insulin  Glargine (BASAGLAR  KWIKPEN) 100 UNIT/ML Inject 50 Units into the skin daily. 30 mL 3   Semaglutide , 1 MG/DOSE, (OZEMPIC , 1 MG/DOSE,) 4 MG/3ML SOPN Inject 1 mg into the skin once a week. 3 mL 0   FLUoxetine  (PROZAC ) 40 MG capsule Take 1 capsule (40 mg total) by mouth daily. 30 capsule 2   No facility-administered medications prior to visit.  ROS Review of Systems  Constitutional:  Negative for activity change and appetite change.  HENT:  Negative for sinus pressure and sore throat.   Respiratory:  Negative for chest tightness, shortness of breath and wheezing.   Cardiovascular:  Negative for chest pain and palpitations.  Gastrointestinal:  Positive for diarrhea. Negative for abdominal distention, abdominal pain and constipation.  Genitourinary: Negative.   Musculoskeletal: Negative.   Psychiatric/Behavioral:  Negative for behavioral problems and dysphoric mood.     Objective:  BP (!) 157/108   Pulse 82   Ht 6' (1.829 m)   Wt 226 lb 3.2 oz (102.6 kg)   SpO2 98%   BMI 30.68 kg/m      12/14/2023   11:38 AM 12/14/2023   11:04 AM 09/20/2023   10:41 AM  BP/Weight  Systolic BP 157 162 138  Diastolic BP 108 114 94  Wt. (Lbs)  226.2 220  BMI  30.68 kg/m2 29.84 kg/m2      Physical Exam Constitutional:      Appearance: He is well-developed.  Cardiovascular:     Rate and Rhythm: Normal rate.     Heart sounds: Normal heart sounds. No murmur heard. Pulmonary:     Effort: Pulmonary effort is normal.     Breath sounds: Normal breath sounds. No wheezing or rales.  Chest:     Chest wall: No tenderness.  Abdominal:     General: Bowel sounds are normal. There is no distension.     Palpations: Abdomen is soft. There is no mass.     Tenderness: There is no abdominal tenderness.   Musculoskeletal:        General: Normal range of motion.     Right lower leg: No edema.     Left lower leg: No edema.  Neurological:     Mental Status: He is alert and oriented to person, place, and time.  Psychiatric:        Mood and Affect: Mood normal.        Latest Ref Rng & Units 09/07/2023    4:24 PM 09/09/2022   10:12 AM 06/02/2022    2:42 PM  CMP  Glucose 70 - 99 mg/dL 585  618  620   BUN 6 - 20 mg/dL 12  12  12    Creatinine 0.76 - 1.27 mg/dL 9.23  9.20  9.20   Sodium 134 - 144 mmol/L 138  136  138   Potassium 3.5 - 5.2 mmol/L 4.4  5.1  4.6   Chloride 96 - 106 mmol/L 97  97  97   CO2 20 - 29 mmol/L 23  23  24    Calcium  8.7 - 10.2 mg/dL 9.3  89.5  9.9   Total Protein 6.0 - 8.5 g/dL 6.7     Total Bilirubin 0.0 - 1.2 mg/dL 0.2     Alkaline Phos 44 - 121 IU/L 113     AST 0 - 40 IU/L 15     ALT 0 - 44 IU/L 26       Lipid Panel     Component Value Date/Time   CHOL 197 03/02/2022 1001   TRIG 183 (H) 03/02/2022 1001   HDL 35 (L) 03/02/2022 1001   CHOLHDL 6.8 (H) 09/19/2021 0957   CHOLHDL 4.2 08/15/2014 1211   VLDL 41 (H) 08/15/2014 1211   LDLCALC 129 (H) 03/02/2022 1001    CBC    Component Value Date/Time   WBC 5.9 09/07/2023 1624   WBC 4.1 04/12/2021 1534  RBC 4.85 09/07/2023 1624   RBC 4.86 04/12/2021 1534   HGB 13.4 09/07/2023 1624   HCT 41.5 09/07/2023 1624   PLT 388 09/07/2023 1624   MCV 86 09/07/2023 1624   MCH 27.6 09/07/2023 1624   MCH 27.6 04/12/2021 1534   MCHC 32.3 09/07/2023 1624   MCHC 35.0 04/12/2021 1534   RDW 12.6 09/07/2023 1624   LYMPHSABS 2.6 09/07/2023 1624   MONOABS 0.4 07/02/2015 2316   EOSABS 0.2 09/07/2023 1624   BASOSABS 0.0 09/07/2023 1624    Lab Results  Component Value Date   HGBA1C 10.2 (A) 12/14/2023    Assessment & Plan:      Chronic Diarrhea Diarrhea incontinence since 2022, predominantly nocturnal. No blood in stool, but reports mucous. Associated with increased burping, sometimes sulfuric. Negative H. Pylori  test in the past. Family history of gastritis and gallbladder disease. Possible differential diagnoses include pancreatic disorder, gallbladder disease, or inflammatory bowel disease. -Refer to gastroenterology for further evaluation, possibly including colonoscopy.  Hypertension Elevated blood pressure at today's visit, patient reports not taking medication due to misplaced medication during a move. -Continue Lisinopril  as prescribed once located. -Counseled on blood pressure goal of less than 130/80, low-sodium, DASH diet, medication compliance, 150 minutes of moderate intensity exercise per week. Discussed medication compliance, adverse effects.   Type 2 Diabetes Mellitus A1c improved from 14.4 to 10.2. Patient reports inconsistent use of Farxiga , but is using Ozempic  and Basaglar  insulin . -Increase Basaglar  to 55 units. -Increase Ozempic  to 2mg . -Check A1c in 3 months. -Had referred him to endocrinology and he has an upcoming appointment next month  Musculoskeletal Pain Reports back soreness and is requesting a refill of cyclobenzaprine  which he previously received from urgent care. -Prescribe Cyclobenzaprine  for muscle relaxation as needed.  General Health Maintenance -Refer to ophthalmology for routine eye exam. -Follow-up in 3 months.          Meds ordered this encounter  Medications   Insulin  Glargine (BASAGLAR  KWIKPEN) 100 UNIT/ML    Sig: Inject 55 Units into the skin daily.    Dispense:  30 mL    Refill:  3    Dose increase   Semaglutide , 2 MG/DOSE, 8 MG/3ML SOPN    Sig: Inject 2 mg as directed once a week.    Dispense:  3 mL    Refill:  1    Dose increase   cyclobenzaprine  (FLEXERIL ) 10 MG tablet    Sig: Take 1 tablet (10 mg total) by mouth 2 (two) times daily as needed for muscle spasms.    Dispense:  30 tablet    Refill:  1    Follow-up: Return in about 3 months (around 03/13/2024) for Chronic medical conditions.       Corrina Sabin, MD, FAAFP. [facility] [place], [place] [phone]   12/14/2023, 12:30 PM

## 2023-12-14 NOTE — Patient Instructions (Signed)
 VISIT SUMMARY:  During today's visit, we discussed your ongoing issues with chronic diarrhea and incontinence, elevated blood pressure, diabetes management, and back soreness. We also reviewed your need for an eye exam and made plans for follow-up care.  YOUR PLAN:  -CHRONIC DIARRHEA: Chronic diarrhea is a condition where you have frequent, loose, or watery bowel movements for an extended period. We will refer you to a gastroenterologist for further evaluation, which may include a colonoscopy to determine the underlying cause.  -HYPERTENSION: Hypertension, or high blood pressure, is when the force of your blood against your artery walls is too high. You should continue taking Lisinopril  as prescribed once you locate your medication.  -TYPE 2 DIABETES MELLITUS: Type 2 diabetes is a condition that affects the way your body processes blood sugar. Your A1c levels have improved, but we need to increase your Basaglar  insulin  to 55 units and your Ozempic  to 2mg . We will check your A1c again in 3 months.  -MUSCULOSKELETAL PAIN: Musculoskeletal pain refers to pain in the muscles, bones, or joints. We will prescribe Cyclobenzaprine  for muscle relaxation to help with your back soreness.  -GENERAL HEALTH MAINTENANCE: We will refer you to an ophthalmologist for a routine eye exam. Please follow up in 3 months for a review of your condition and any necessary adjustments to your treatment plan.  INSTRUCTIONS:  Please follow up in 3 months for a review of your condition and any necessary adjustments to your treatment plan. Make sure to take your Lisinopril  once you locate it, and follow through with the gastroenterology and ophthalmology referrals.

## 2023-12-16 ENCOUNTER — Other Ambulatory Visit: Payer: Self-pay

## 2023-12-16 DIAGNOSIS — Z794 Long term (current) use of insulin: Secondary | ICD-10-CM

## 2023-12-17 ENCOUNTER — Other Ambulatory Visit: Payer: MEDICAID

## 2023-12-18 LAB — COMPREHENSIVE METABOLIC PANEL
AG Ratio: 1.8 (calc) (ref 1.0–2.5)
ALT: 18 U/L (ref 9–46)
AST: 14 U/L (ref 10–40)
Albumin: 4.3 g/dL (ref 3.6–5.1)
Alkaline phosphatase (APISO): 91 U/L (ref 36–130)
BUN: 12 mg/dL (ref 7–25)
CO2: 27 mmol/L (ref 20–32)
Calcium: 8.7 mg/dL (ref 8.6–10.3)
Chloride: 106 mmol/L (ref 98–110)
Creat: 0.88 mg/dL (ref 0.60–1.26)
Globulin: 2.4 g/dL (ref 1.9–3.7)
Glucose, Bld: 175 mg/dL — ABNORMAL HIGH (ref 65–99)
Potassium: 4.9 mmol/L (ref 3.5–5.3)
Sodium: 142 mmol/L (ref 135–146)
Total Bilirubin: 0.3 mg/dL (ref 0.2–1.2)
Total Protein: 6.7 g/dL (ref 6.1–8.1)

## 2023-12-18 LAB — LIPID PANEL
Cholesterol: 181 mg/dL (ref <200)
HDL: 37 mg/dL — ABNORMAL LOW (ref >=40)
LDL Cholesterol (Calc): 117 mg/dL — ABNORMAL HIGH
Non-HDL Cholesterol (Calc): 144 mg/dL — ABNORMAL HIGH (ref <130)
Total CHOL/HDL Ratio: 4.9 (calc) (ref <5.0)
Triglycerides: 157 mg/dL — ABNORMAL HIGH (ref <150)

## 2023-12-18 LAB — MICROALBUMIN / CREATININE URINE RATIO
Creatinine, Urine: 80 mg/dL (ref 20–320)
Microalb Creat Ratio: 66 mg/g{creat} — ABNORMAL HIGH (ref <30)
Microalb, Ur: 5.3 mg/dL

## 2023-12-18 LAB — HEMOGLOBIN A1C
Hgb A1c MFr Bld: 10.4 %{Hb} — ABNORMAL HIGH (ref <5.7)
Mean Plasma Glucose: 252 mg/dL
eAG (mmol/L): 13.9 mmol/L

## 2023-12-21 ENCOUNTER — Telehealth (INDEPENDENT_AMBULATORY_CARE_PROVIDER_SITE_OTHER): Payer: MEDICAID | Admitting: "Endocrinology

## 2023-12-21 ENCOUNTER — Other Ambulatory Visit: Payer: Self-pay

## 2023-12-21 ENCOUNTER — Encounter: Payer: Self-pay | Admitting: "Endocrinology

## 2023-12-21 DIAGNOSIS — Z7985 Long-term (current) use of injectable non-insulin antidiabetic drugs: Secondary | ICD-10-CM | POA: Diagnosis not present

## 2023-12-21 DIAGNOSIS — Z794 Long term (current) use of insulin: Secondary | ICD-10-CM | POA: Diagnosis not present

## 2023-12-21 DIAGNOSIS — Z7984 Long term (current) use of oral hypoglycemic drugs: Secondary | ICD-10-CM

## 2023-12-21 DIAGNOSIS — E782 Mixed hyperlipidemia: Secondary | ICD-10-CM

## 2023-12-21 DIAGNOSIS — E1165 Type 2 diabetes mellitus with hyperglycemia: Secondary | ICD-10-CM | POA: Diagnosis not present

## 2023-12-21 MED ORDER — BAQSIMI ONE PACK 3 MG/DOSE NA POWD
1.0000 | NASAL | 3 refills | Status: AC | PRN
  Filled 2023-12-21: qty 2, 30d supply, fill #0
  Filled 2023-12-22: qty 2, 2d supply, fill #0
  Filled 2023-12-22: qty 2, 30d supply, fill #0
  Filled 2024-07-05: qty 2, 2d supply, fill #1

## 2023-12-21 MED ORDER — DEXCOM G7 SENSOR MISC
1.0000 | 0 refills | Status: AC
  Filled 2023-12-21: qty 9, fill #0
  Filled 2023-12-31: qty 9, 90d supply, fill #0

## 2023-12-21 NOTE — Progress Notes (Signed)
 The patient reports they are currently: Ernest Mccann. I spent 11-12 minutes on the video with the patient on the date of service. I spent an additional 5 minutes on pre- and post-visit activities on the date of service.   The patient was physically located in [place]  or a state in which I am permitted to provide care. The patient and/or parent/guardian understood that s/he may incur co-pays and cost sharing, and agreed to the telemedicine visit. The visit was reasonable and appropriate under the circumstances given the patient's presentation at the time.  The patient and/or parent/guardian has been advised of the potential risks and limitations of this mode of treatment (including, but not limited to, the absence of in-person examination) and has agreed to be treated using telemedicine. The patient's/patient's family's questions regarding telemedicine have been answered.   The patient and/or parent/guardian has also been advised to contact their provider's office for worsening conditions, and seek emergency medical treatment and/or call 911 if the patient deems either necessary.    Outpatient Endocrinology Note [name], MD  12/21/23   Ernest Mccann Oct 15, 1991 [id number]  Referring Provider: Delbert Clam, MD Primary Care Provider: Delbert Clam, MD Reason for consultation: Subjective   Assessment & Plan  Diagnoses and all orders for this visit:  Uncontrolled type 2 diabetes mellitus with hyperglycemia (HCC)  Long term (current) use of oral hypoglycemic drugs  Long-term (current) use of injectable non-insulin  antidiabetic drugs  Long-term insulin  use (HCC)  Mixed hypercholesterolemia and hypertriglyceridemia  Other orders -     Continuous Glucose Sensor (DEXCOM G7 SENSOR) MISC; use as directed -     Glucagon  (BAQSIMI  ONE PACK) 3 MG/DOSE POWD; Place 1 Device into the nose as needed (Low blood sugar with impaired consciousness).    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, No results found for: GFR Hba1c goal less than 7, current Hba1c is  Lab Results  Component Value Date   HGBA1C 10.4 (H) 12/17/2023   Will recommend the following: Basaglar  55 units qam Ozempic  2 mg/week Farxiga  10 mg every day Ordered DexCom  Discussed to share ore BG to address medication change if needed   No known contraindications/side effects to any of above medications Glucagon  discussed and prescribed with refills on 12/21/23  -Last LD and Tg are as follows: Lab Results  Component Value Date   LDLCALC 117 (H) 12/17/2023    Lab Results  Component Value Date   TRIG 157 (H) 12/17/2023   -not on statin -Follow low fat diet and exercise   -Blood pressure goal <140/90 - Microalbumin/creatinine goal is < 30 -Last MA/Cr is as follows: Lab Results  Component Value Date   MICROALBUR 5.3 12/17/2023   -on ACE/ARB lisinopril  10 mg every day  -diet changes including salt restriction -limit eating outside -counseled BP targets per standards of diabetes care -uncontrolled blood pressure can lead to retinopathy, nephropathy and cardiovascular and atherosclerotic heart disease  Reviewed and counseled on: -A1C target -Blood sugar targets -Complications of uncontrolled diabetes  -Checking blood sugar before meals and bedtime and bring log next visit -All medications with mechanism of action and side effects -Hypoglycemia management: rule of 15's, Glucagon  Emergency Kit and medical alert ID -low-carb low-fat plate-method diet -At least 20 minutes of physical activity per day -Annual dilated retinal eye exam and foot exam -compliance and follow up needs -follow up as scheduled or earlier if problem gets worse  Call if blood sugar is less than 70 or consistently above 250  Take a 15 gm snack of carbohydrate at bedtime before you go to sleep if your blood sugar is less than 100.    If you are going to fast after midnight for a test or procedure, ask your  physician for instructions on how to reduce/decrease your insulin  dose.    Call if blood sugar is less than 70 or consistently above 250  -Treating a low sugar by rule of 15  (15 gms of sugar every 15 min until sugar is more than 70) If you feel your sugar is low, test your sugar to be sure If your sugar is low (less than 70), then take 15 grams of a fast acting Carbohydrate (3-4 glucose tablets or glucose gel or 4 ounces of juice or regular soda) Recheck your sugar 15 min after treating low to make sure it is more than 70 If sugar is still less than 70, treat again with 15 grams of carbohydrate          Don't drive the hour of hypoglycemia  If unconscious/unable to eat or drink by mouth, use glucagon  injection or nasal spray baqsimi  and call 911. Can repeat again in 15 min if still unconscious.  Return in about 2 months (around 02/18/2024).   I have reviewed current medications, nurse's notes, allergies, vital signs, past medical and surgical history, family medical history, and social history for this encounter. Counseled patient on symptoms, examination findings, lab findings, imaging results, treatment decisions and monitoring and prognosis. The patient understood the recommendations and agrees with the treatment plan. All questions regarding treatment plan were fully answered.  [name], MD  12/21/23    History of Present Illness Ernest Mccann is a 33 y.o. year old male who presents for evaluation of Type II diabetes mellitus.  Ernest Mccann was first diagnosed in 2015.   Diabetes education +  Home diabetes regimen: Basaglar  55 units qam Ozempic  2 mg/week Farxiga  10 mg every day  COMPLICATIONS -  MI/Stroke -  retinopathy +  neuropathy +  nephropathy/microalbuminuria  SYMPTOMS REVIEWED - Polyuria - Weight loss + Blurred vision  BLOOD SUGAR DATA Blood Sugar readings: 1/7:111 1/6 @ 7:35 pm 138, 9 am: 125 1/5: @ 11:01 am 127  1/3: @ 11:20 am  123  Physical Exam  There were no vitals taken for this visit.   Constitutional: well developed, well nourished Head: normocephalic, atraumatic Eyes: sclera anicteric, no redness Neck: supple Lungs: normal respiratory effort Neurology: alert and oriented Skin: dry, no appreciable rashes Musculoskeletal: no appreciable defects Psychiatric: normal mood and affect Diabetic Foot Exam - Simple   No data filed      Current Medications Patient's Medications  New Prescriptions   CONTINUOUS GLUCOSE SENSOR (DEXCOM G7 SENSOR) MISC    use as directed   GLUCAGON  (BAQSIMI  ONE PACK) 3 MG/DOSE POWD    Place 1 Device into the nose as needed (Low blood sugar with impaired consciousness).  Previous Medications   ALBUTEROL  (VENTOLIN  HFA) 108 (90 BASE) MCG/ACT INHALER    Inhale 2 puffs into the lungs every 6 (six) hours as needed for wheezing or shortness of breath.   BLOOD GLUCOSE MONITORING SUPPL (TRUE METRIX METER) W/DEVICE KIT    Use 3 (three) times daily before meals.   CYCLOBENZAPRINE  (FLEXERIL ) 10 MG TABLET    Take 1 tablet (10 mg total) by mouth 2 (two) times daily as needed for muscle spasms.   DAPAGLIFLOZIN  PROPANEDIOL (FARXIGA ) 10 MG TABS TABLET    Take 1  tablet (10 mg total) by mouth daily before breakfast.   ERGOCALCIFEROL  (DRISDOL ) 1.25 MG (50000 UT) CAPSULE    Take 1 capsule (50,000 Units total) by mouth once a week.   FLUOXETINE  (PROZAC ) 40 MG CAPSULE    Take 1 capsule (40 mg total) by mouth daily.   GABAPENTIN  (NEURONTIN ) 300 MG CAPSULE    Take 1 capsule (300 mg total) by mouth at bedtime.   GLUCOSE BLOOD (TRUE METRIX BLOOD GLUCOSE TEST) TEST STRIP    Use 3 times daily   INSULIN  GLARGINE (BASAGLAR  KWIKPEN) 100 UNIT/ML    Inject 55 Units into the skin daily.   INSULIN  PEN NEEDLE (TECHLITE PEN NEEDLES) 31G X 5 MM MISC    Use 1 pen needle each daily at bedtime.   LISINOPRIL  (ZESTRIL ) 10 MG TABLET    Take 1 tablet (10 mg total) by mouth daily.   MUPIROCIN  OINTMENT (BACTROBAN ) 2 %    Apply  1 Application topically 2 (two) times daily.   PROMETHAZINE -DEXTROMETHORPHAN (PROMETHAZINE -DM) 6.25-15 MG/5ML SYRUP    Take 5 mLs by mouth 3 (three) times daily as needed for cough.   SELENIUM  SULFIDE 2.25 % SHAM    Use 2 (two) times a week.   SEMAGLUTIDE , 2 MG/DOSE, 8 MG/3ML SOPN    Inject 2 mg as directed once a week.   SILDENAFIL  (VIAGRA ) 100 MG TABLET    Take 1 tablet (100 mg total) by mouth daily as needed for erectile dysfunction. At least 24 hours between doses   TRAZODONE  (DESYREL ) 50 MG TABLET    Take 1 tablet (50 mg total) by mouth at bedtime.   TRUEPLUS LANCETS 28G MISC    Use 3 (three) times daily before meals.  Modified Medications   No medications on file  Discontinued Medications   No medications on file    Allergies No Known Allergies  Past Medical History Past Medical History:  Diagnosis Date   Depression    Diabetes mellitus without complication (HCC)    Diabetes mellitus without complication (HCC)    Type II    Past Surgical History Past Surgical History:  Procedure Laterality Date   KNEE ARTHROPLASTY     TONSILLECTOMY      Family History family history includes Cancer in his maternal aunt; Diabetes in his father and mother; Heart disease in his father and maternal grandfather; Renal Disease in his father.  Social History Social History   Socioeconomic History   Marital status: Married    Spouse name: Not on file   Number of children: Not on file   Years of education: Not on file   Highest education level: Some college, no degree  Occupational History   Not on file  Tobacco Use   Smoking status: Never   Smokeless tobacco: Never  Substance and Sexual Activity   Alcohol use: Yes    Comment: socially   Drug use: Yes    Comment: THC   Sexual activity: Yes  Other Topics Concern   Not on file  Social History Narrative   ** Merged History Encounter **       Social Drivers of Health   Financial Resource Strain: Medium Risk (12/13/2023)    Overall Financial Resource Strain (CARDIA)    Difficulty of Paying Living Expenses: Somewhat hard  Food Insecurity: Food Insecurity Present (12/13/2023)   Hunger Vital Sign    Worried About Running Out of Food in the Last Year: Often true    Ran Out of Food in the Last Year:  Sometimes true  Transportation Needs: No Transportation Needs (12/13/2023)   PRAPARE - Administrator, Civil Service (Medical): No    Lack of Transportation (Non-Medical): No  Physical Activity: Inactive (12/13/2023)   Exercise Vital Sign    Days of Exercise per Week: 0 days    Minutes of Exercise per Session: 30 min  Stress: No Stress Concern Present (12/13/2023)   [employer] of Occupational Health - Occupational Stress Questionnaire    Feeling of Stress : Only a little  Social Connections: Moderately Integrated (12/13/2023)   Social Connection and Isolation Panel [NHANES]    Frequency of Communication with Friends and Family: More than three times a week    Frequency of Social Gatherings with Friends and Family: Twice a week    Attends Religious Services: More than 4 times per year    Active Member of Clubs or Organizations: Yes    Attends Banker Meetings: More than 4 times per year    Marital Status: Divorced  Intimate Partner Violence: Not At Risk (12/14/2023)   Humiliation, Afraid, Rape, and Kick questionnaire    Fear of Current or Ex-Partner: No    Emotionally Abused: No    Physically Abused: No    Sexually Abused: No    Lab Results  Component Value Date   HGBA1C 10.4 (H) 12/17/2023   HGBA1C 10.2 (A) 12/14/2023   HGBA1C 14.4 (A) 09/07/2023   Lab Results  Component Value Date   CHOL 181 12/17/2023   Lab Results  Component Value Date   HDL 37 (L) 12/17/2023   Lab Results  Component Value Date   LDLCALC 117 (H) 12/17/2023   Lab Results  Component Value Date   TRIG 157 (H) 12/17/2023   Lab Results  Component Value Date   CHOLHDL 4.9 12/17/2023   Lab  Results  Component Value Date   CREATININE 0.88 12/17/2023   No results found for: GFR Lab Results  Component Value Date   MICROALBUR 5.3 12/17/2023      Component Value Date/Time   NA 142 12/17/2023 0903   NA 138 09/07/2023 1624   K 4.9 12/17/2023 0903   CL 106 12/17/2023 0903   CO2 27 12/17/2023 0903   GLUCOSE 175 (H) 12/17/2023 0903   BUN 12 12/17/2023 0903   BUN 12 09/07/2023 1624   CREATININE 0.88 12/17/2023 0903   CALCIUM  8.7 12/17/2023 0903   PROT 6.7 12/17/2023 0903   PROT 6.7 09/07/2023 1624   ALBUMIN 4.3 09/07/2023 1624   AST 14 12/17/2023 0903   ALT 18 12/17/2023 0903   ALKPHOS 113 09/07/2023 1624   BILITOT 0.3 12/17/2023 0903   BILITOT 0.2 09/07/2023 1624   GFRNONAA >60 04/12/2021 1534   GFRNONAA >89 08/15/2014 1211   GFRAA >60 07/02/2015 2316   GFRAA >89 08/15/2014 1211      Latest Ref Rng & Units 12/17/2023    9:03 AM 09/07/2023    4:24 PM 09/09/2022   10:12 AM  BMP  Glucose 65 - 99 mg/dL 824  585  618   BUN 7 - 25 mg/dL 12  12  12    Creatinine 0.60 - 1.26 mg/dL 9.11  9.23  9.20   BUN/Creat Ratio 6 - 22 (calc) SEE NOTE:  16  15   Sodium 135 - 146 mmol/L 142  138  136   Potassium 3.5 - 5.3 mmol/L 4.9  4.4  5.1   Chloride 98 - 110 mmol/L 106  97  97  CO2 20 - 32 mmol/L 27  23  23    Calcium  8.6 - 10.3 mg/dL 8.7  9.3  89.5        Component Value Date/Time   WBC 5.9 09/07/2023 1624   WBC 4.1 04/12/2021 1534   RBC 4.85 09/07/2023 1624   RBC 4.86 04/12/2021 1534   HGB 13.4 09/07/2023 1624   HCT 41.5 09/07/2023 1624   PLT 388 09/07/2023 1624   MCV 86 09/07/2023 1624   MCH 27.6 09/07/2023 1624   MCH 27.6 04/12/2021 1534   MCHC 32.3 09/07/2023 1624   MCHC 35.0 04/12/2021 1534   RDW 12.6 09/07/2023 1624   LYMPHSABS 2.6 09/07/2023 1624   MONOABS 0.4 07/02/2015 2316   EOSABS 0.2 09/07/2023 1624   BASOSABS 0.0 09/07/2023 1624     Parts of this note may have been dictated using voice recognition software. There may be variances in spelling and  vocabulary which are unintentional. Not all errors are proofread. Please notify the dino if any discrepancies are noted or if the meaning of any statement is not clear.

## 2023-12-21 NOTE — Patient Instructions (Signed)

## 2023-12-22 ENCOUNTER — Other Ambulatory Visit (HOSPITAL_COMMUNITY): Payer: Self-pay

## 2023-12-22 ENCOUNTER — Other Ambulatory Visit: Payer: Self-pay

## 2023-12-24 ENCOUNTER — Encounter: Payer: Self-pay | Admitting: Gastroenterology

## 2023-12-28 ENCOUNTER — Encounter: Payer: Self-pay | Admitting: Family Medicine

## 2023-12-28 ENCOUNTER — Other Ambulatory Visit: Payer: Self-pay | Admitting: Family Medicine

## 2023-12-29 ENCOUNTER — Other Ambulatory Visit: Payer: Self-pay

## 2023-12-29 ENCOUNTER — Other Ambulatory Visit: Payer: Self-pay | Admitting: Family Medicine

## 2023-12-29 MED ORDER — DIPHENOXYLATE-ATROPINE 2.5-0.025 MG PO TABS
1.0000 | ORAL_TABLET | Freq: Four times a day (QID) | ORAL | 1 refills | Status: DC | PRN
  Filled 2023-12-29: qty 30, 8d supply, fill #0
  Filled 2024-02-04 (×2): qty 30, 8d supply, fill #1

## 2023-12-31 ENCOUNTER — Other Ambulatory Visit: Payer: Self-pay | Admitting: Family Medicine

## 2023-12-31 DIAGNOSIS — E1142 Type 2 diabetes mellitus with diabetic polyneuropathy: Secondary | ICD-10-CM

## 2024-01-03 ENCOUNTER — Other Ambulatory Visit (HOSPITAL_COMMUNITY): Payer: Self-pay

## 2024-01-03 ENCOUNTER — Other Ambulatory Visit: Payer: Self-pay

## 2024-01-03 MED ORDER — TRUE METRIX BLOOD GLUCOSE TEST VI STRP
ORAL_STRIP | 6 refills | Status: AC
  Filled 2024-01-03: qty 100, 33d supply, fill #0
  Filled 2024-01-05: qty 100, 33d supply, fill #1

## 2024-01-03 MED ORDER — TRUEPLUS LANCETS 28G MISC
1.0000 | Freq: Three times a day (TID) | 6 refills | Status: AC
  Filled 2024-01-03: qty 102, 34d supply, fill #0
  Filled 2024-01-05 – 2024-02-03 (×2): qty 102, 34d supply, fill #1

## 2024-01-03 NOTE — Telephone Encounter (Signed)
Requested Prescriptions  Pending Prescriptions Disp Refills   TRUEplus Lancets 28G MISC 100 each 6    Sig: Use 3 (three) times daily before meals.     Endocrinology: Diabetes - Testing Supplies Passed - 01/03/2024  8:37 AM      Passed - Valid encounter within last 12 months    Recent Outpatient Visits           2 weeks ago Type 2 diabetes mellitus with diabetic polyneuropathy, with long-term current use of insulin (HCC)   [facility] - [facility] Dept Of [place]. [facility] Hoy Register, MD   3 months ago Type 2 diabetes mellitus with diabetic polyneuropathy, with long-term current use of insulin (HCC)   [facility] [place] - A Dept Of [place]. [facility] Hoy Register, MD   1 year ago Type 2 diabetes mellitus with diabetic polyneuropathy, with long-term current use of insulin (HCC)   [facility] - [facility] Dept Of [place]. [facility] Hoy Register, MD   1 year ago Type 2 diabetes mellitus with diabetic polyneuropathy, with long-term current use of insulin (HCC)   [facility] [place] - [facility] Dept Of [place]. [facility] Hoy Register, MD   1 year ago Type 2 diabetes mellitus with diabetic polyneuropathy, with long-term current use of insulin (HCC)   [facility] - [facility] Dept Of [place]. [facility] [place], Marzella Schlein, [place]       Future Appointments             In 2 months Hoy Register, MD [facility] [place] - A Dept Of [place]. [facility]             glucose blood (TRUE METRIX BLOOD GLUCOSE TEST) test strip 100 each 6    Sig: Use 3 times daily     Endocrinology: Diabetes - Testing Supplies Passed - 01/03/2024  8:37 AM      Passed - Valid encounter within last 12 months    Recent Outpatient Visits           2 weeks ago Type 2 diabetes mellitus with diabetic polyneuropathy, with long-term current use of insulin (HCC)   [facility] [place] - [facility] Dept Of [place]. [facility] Hoy Register, MD   3 months ago Type 2 diabetes mellitus with diabetic polyneuropathy, with long-term current use of insulin (HCC)   [facility] [place] - A Dept Of [place]. [facility] Hoy Register, MD   1 year ago Type 2 diabetes mellitus with diabetic polyneuropathy, with long-term current use of insulin (HCC)   [facility] [place] - A Dept Of [place]. [facility] Hoy Register, MD   1 year ago Type 2 diabetes mellitus with diabetic polyneuropathy, with long-term current use of insulin (HCC)   [facility] - [facility] Dept Of [place]. [facility] Hoy Register, MD   1 year ago Type 2 diabetes mellitus with diabetic polyneuropathy, with long-term current use of insulin (HCC)   [facility] [place] - [facility] Dept Of [place]. [facility] [place], Marzella Schlein, [place]       Future Appointments             In 2 months Hoy Register, MD [facility] - A Dept  Of [place]. [facility]

## 2024-01-05 ENCOUNTER — Other Ambulatory Visit (HOSPITAL_COMMUNITY): Payer: Self-pay

## 2024-01-05 ENCOUNTER — Other Ambulatory Visit: Payer: Self-pay | Admitting: Family Medicine

## 2024-01-05 MED ORDER — ERGOCALCIFEROL 1.25 MG (50000 UT) PO CAPS
50000.0000 [IU] | ORAL_CAPSULE | ORAL | 0 refills | Status: DC
  Filled 2024-01-05: qty 12, 84d supply, fill #0

## 2024-01-06 ENCOUNTER — Other Ambulatory Visit: Payer: Self-pay

## 2024-01-07 ENCOUNTER — Other Ambulatory Visit: Payer: Self-pay

## 2024-01-12 ENCOUNTER — Other Ambulatory Visit: Payer: Self-pay

## 2024-01-12 ENCOUNTER — Other Ambulatory Visit (HOSPITAL_COMMUNITY): Payer: Self-pay

## 2024-01-12 MED ORDER — ACCU-CHEK GUIDE W/DEVICE KIT
PACK | 0 refills | Status: DC
  Filled 2024-01-12: qty 1, 30d supply, fill #0

## 2024-01-13 NOTE — Progress Notes (Signed)
Chief Complaint:diarrhea Primary GI Doctor: Dr. Leonides Schanz  HPI: Patient is a 33 year old male patient with past medical history of depression and DM type 2, who was referred to me by Hoy Register, MD, on 12/14/2023 for a complaint of diarrhea.    Interval History     Patient presents today with main complaint of chronic diarrhea that started two years ago. No new medications. He does reports he had a lot of stress when this started including his mother passing away, he lost his house, and had a newborn. He reports intermittent episodes of diarrhea few times a month and will last on average 2-3 days. During these episodes he will have on average 5-6 loose stools with fecal incontinence during his sleep. He uses OTC imodium alternating with lomotil which works well. When he does not have an episode he has regular bowel movements. No abdominal pain or bleeding. He reports his symptoms are worse with highly season foods. He also reports flatulence with strong odor.     Patient also complains of intermittently having episodes of burping. He denies pyrosis or regurgitation. Patient complains the burping sometimes burns his throat and mouth. He reports in the past the burps would smell like rotten eggs. The burping cause be worse with spicy foods. He admits he eats quickly. Does not drink carbonated drinks. He has not tried OTC antiacids. Per patient has been checked in past for H. Pylori and it was negative. Patient denies dysphagia. Patient denies nausea, vomiting, or weight loss.Patient started on Ozempic 1 year ago. 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  Socially drinks alcohol. Socially smokes cigars. Denies NSAID use. Surgical history includes knee reconstruction and tonsillectomy. Patients never had colonoscopy or EGD Patient's family history includes aunt with pancreatic CA, great uncle with colon CA. Mother with gastritis, dad had liver  transplant from cirrhosis  Wt Readings from Last 3 Encounters:  01/14/24 220 lb (99.8 kg)  12/14/23 226 lb 3.2 oz (102.6 kg)  09/20/23 220 lb (99.8 kg)    Past Medical History:  Diagnosis Date   Anxiety    Depression    Diabetes mellitus without complication (HCC)    Diabetes mellitus without complication (HCC)    Type II   Hypertension    Neuropathy    Vitamin D deficiency    Past Surgical History:  Procedure Laterality Date   KNEE ARTHROPLASTY Left    TONSILLECTOMY      Current Outpatient Medications  Medication Sig Dispense Refill   albuterol (VENTOLIN HFA) 108 (90 Base) MCG/ACT inhaler Inhale 2 puffs into the lungs every 6 (six) hours as needed for wheezing or shortness of breath. 20.1 g 3   Blood Glucose Monitoring Suppl (ACCU-CHEK GUIDE) w/Device KIT Use to check blood glucose 3 times a day 1 kit 0   Blood Glucose Monitoring Suppl (TRUE METRIX METER) w/Device KIT Use 3 (three) times daily before meals. 1 kit 0   Continuous Glucose Sensor (DEXCOM G7 SENSOR) MISC use as directed 9 each 0   cyclobenzaprine (FLEXERIL) 10 MG tablet Take 1 tablet (10 mg total) by mouth 2 (two) times daily as needed for muscle spasms. 30 tablet 1   dapagliflozin propanediol (FARXIGA) 10 MG TABS tablet Take 1 tablet (10 mg total) by mouth daily before breakfast. 30 tablet 3   diphenoxylate-atropine (LOMOTIL) 2.5-0.025 MG tablet Take 1 tablet by mouth 4 (four) times daily as needed for diarrhea or loose stools.  30 tablet 1   ergocalciferol (DRISDOL) 1.25 MG (50000 UT) capsule Take 1 capsule (50,000 Units total) by mouth once a week. 12 capsule 0   gabapentin (NEURONTIN) 300 MG capsule Take 1 capsule (300 mg total) by mouth at bedtime. 30 capsule 6   Glucagon (BAQSIMI ONE PACK) 3 MG/DOSE POWD Place 1 Device into the nose as needed (Low blood sugar with impaired consciousness). 2 each 3   glucose blood (TRUE METRIX BLOOD GLUCOSE TEST) test strip Use 3 times daily 100 each 6   Insulin Glargine  (BASAGLAR KWIKPEN) 100 UNIT/ML Inject 55 Units into the skin daily. 30 mL 3   Insulin Pen Needle (TECHLITE PEN NEEDLES) 31G X 5 MM MISC Use 1 pen needle each daily at bedtime. 100 each 5   lisinopril (ZESTRIL) 10 MG tablet Take 1 tablet (10 mg total) by mouth daily. 30 tablet 6   mupirocin ointment (BACTROBAN) 2 % Apply 1 Application topically 2 (two) times daily. 22 g 0   promethazine-dextromethorphan (PROMETHAZINE-DM) 6.25-15 MG/5ML syrup Take 5 mLs by mouth 3 (three) times daily as needed for cough. 200 mL 0   Selenium Sulfide 2.25 % SHAM Use 2 (two) times a week. 180 mL 0   Semaglutide, 2 MG/DOSE, 8 MG/3ML SOPN Inject 2 mg as directed once a week. 3 mL 1   sildenafil (VIAGRA) 100 MG tablet Take 1 tablet (100 mg total) by mouth daily as needed for erectile dysfunction. At least 24 hours between doses 10 tablet 0   traZODone (DESYREL) 50 MG tablet Take 1 tablet (50 mg total) by mouth at bedtime. 30 tablet 1   TRUEplus Lancets 28G MISC Use 3 (three) times daily before meals. 100 each 6   No current facility-administered medications for this visit.   Allergies as of 01/14/2024   (No Known Allergies)   Family History  Problem Relation Age of Onset   Diabetes Mother    Diabetes Father    Heart disease Father    Renal Disease Father    Cirrhosis Father        had a liver transplant   Other Brother        liver fibrosis   Heart disease Maternal Grandfather    Pancreatic cancer Maternal Aunt    Breast cancer Cousin        mat side   Colon cancer Other        mat great uncle   Esophageal cancer Neg Hx    Review of Systems:    Constitutional: No weight loss, fever, chills, weakness or fatigue HEENT: Eyes: No change in vision               Ears, Nose, Throat:  No change in hearing or congestion Skin: No rash or itching Cardiovascular: No chest pain, chest pressure or palpitations   Respiratory: No SOB or cough Gastrointestinal: See HPI and otherwise negative Genitourinary: No  dysuria or change in urinary frequency Neurological: No headache, dizziness or syncope Musculoskeletal: No new muscle or joint pain Hematologic: No bleeding or bruising Psychiatric: No history of depression or anxiety   Physical Exam:  Vital signs: BP 124/82   Pulse 87   Ht 6' (1.829 m)   Wt 220 lb (99.8 kg)   BMI 29.84 kg/m   Constitutional: Pleasant male appears to be in NAD, Well developed, Well nourished, alert and cooperative Throat: Oral cavity and pharynx without inflammation, swelling or lesion.  Respiratory: Respirations even and unlabored. Lungs clear to auscultation bilaterally.  No wheezes, crackles, or rhonchi.  Cardiovascular: Normal S1, S2. Regular rate and rhythm. No peripheral edema, cyanosis or pallor.  Gastrointestinal:  Soft, nondistended, nontender. No rebound or guarding. Hypoactive bowel sounds. No appreciable masses or hepatomegaly. Rectal:  Not performed.  Anoscopy: Skin:   Dry and intact without significant lesions or rashes. Psychiatric: Oriented to person, place and time. Demonstrates good judgement and reason without abnormal affect or behaviors.  RELEVANT LABS AND IMAGING: CBC    Latest Ref Rng & Units 09/07/2023    4:24 PM 04/12/2021    3:34 PM 07/02/2015   11:16 PM  CBC  WBC 3.4 - 10.8 x10E3/uL 5.9  4.1  5.5   Hemoglobin 13.0 - 17.7 g/dL 95.2  84.1  32.4   Hematocrit 37.5 - 51.0 % 41.5  38.3  42.1   Platelets 150 - 450 x10E3/uL 388  294  320     CMP     Latest Ref Rng & Units 12/17/2023    9:03 AM 09/07/2023    4:24 PM 09/09/2022   10:12 AM  CMP  Glucose 65 - 99 mg/dL 401  027  253   BUN 7 - 25 mg/dL 12  12  12    Creatinine 0.60 - 1.26 mg/dL 6.64  4.03  4.74   Sodium 135 - 146 mmol/L 142  138  136   Potassium 3.5 - 5.3 mmol/L 4.9  4.4  5.1   Chloride 98 - 110 mmol/L 106  97  97   CO2 20 - 32 mmol/L 27  23  23    Calcium 8.6 - 10.3 mg/dL 8.7  9.3  25.9   Total Protein 6.1 - 8.1 g/dL 6.7  6.7    Total Bilirubin 0.2 - 1.2 mg/dL 0.3  0.2     Alkaline Phos 44 - 121 IU/L  113    AST 10 - 40 U/L 14  15    ALT 9 - 46 U/L 18  26     12/17/23 labs show: Ha1c 10.4  Assessment: Encounter Diagnoses  Name Primary?   Flatulence Yes   Burping    Diarrhea, unspecified type    Fecal smearing    Throat burning       33 year old male patient that presents with several gastrointestinal complaints. His first issue of diarrhea has been chronic and ongoing for two years. I would like to initially get a stool test to rule out pancreatic insufficiency with his hx of DM. I also want to check lab work to rule out thyroid disease, celiac, or inflammatory disease.  He also has foul smelling flatulence and would like to order Sibo testing to rule out intestinal overgrowth. We discussed low fodmap diet and avoiding trigger foods. He can use prn antidiarrheals, but do not recommend daily if he does not have daily symptoms. I will add Citrucel 1 tsp po daily to soak up the loose stool and hopefully correct the incontinence issue.    For the burping we discussed eating habits and eating more slowly. Recommended trialing Pepcid for a few weeks, but since he is not currently having symptoms he would like to hold off. Will also give handout on GERD diet, no late meals.   Plan: - Reinforce GERD diet, no late meals - OTC Famotidine 20mg  if burping or throat burning returns - Start Citrucel 1 tsp po daily  -Check CRP, TTG IgA, IgA, TSH  - Check stool elastase  - Order sibo breath test - Start low fodmap  diet -OTC imodium and lomotil as needed only -Follow up 3 months   Thank you for the courtesy of this consult. Please call me with any questions or concerns.   Danny Yackley, FNP-C [facility] 01/14/2024, 12:32 PM  Cc: Hoy Register, MD

## 2024-01-14 ENCOUNTER — Other Ambulatory Visit (INDEPENDENT_AMBULATORY_CARE_PROVIDER_SITE_OTHER): Payer: MEDICAID

## 2024-01-14 ENCOUNTER — Ambulatory Visit (INDEPENDENT_AMBULATORY_CARE_PROVIDER_SITE_OTHER): Payer: MEDICAID | Admitting: Gastroenterology

## 2024-01-14 ENCOUNTER — Encounter: Payer: Self-pay | Admitting: Gastroenterology

## 2024-01-14 VITALS — BP 124/82 | HR 87 | Ht 72.0 in | Wt 220.0 lb

## 2024-01-14 DIAGNOSIS — R197 Diarrhea, unspecified: Secondary | ICD-10-CM | POA: Diagnosis not present

## 2024-01-14 DIAGNOSIS — R151 Fecal smearing: Secondary | ICD-10-CM

## 2024-01-14 DIAGNOSIS — R142 Eructation: Secondary | ICD-10-CM

## 2024-01-14 DIAGNOSIS — R143 Flatulence: Secondary | ICD-10-CM

## 2024-01-14 DIAGNOSIS — R070 Pain in throat: Secondary | ICD-10-CM

## 2024-01-14 LAB — TSH: TSH: 3.8 u[IU]/mL (ref 0.35–5.50)

## 2024-01-14 NOTE — Progress Notes (Signed)
 I agree with the assessment and plan as outlined by Ms. May.

## 2024-01-14 NOTE — Patient Instructions (Addendum)
You have been given a testing kit to check for small intestine bacterial overgrowth (SIBO) which is completed by a company named [facility]. Make sure to return your test in the mail using the return mailing label given to you along with the kit. The test order, your demographic and insurance information have all already been sent to the company. [facility] will collect an upfront charge of $99.74 for commercial insurance plans and $209.74 if you are paying cash. Make sure to discuss with [facility] PRIOR to having the test to see if they have gotten information from your insurance company as to how much your testing will cost out of pocket, if any. Please contact [facility] at phone number [phone] to get instructions regarding how to perform the test as our office is unable to give specific testing instructions.   Your provider has requested that you go to the basement level for lab work before leaving today. Press "B" on the elevator. The lab is located at the first door on the left as you exit the elevator.   Please purchase the following medications over the counter and take as directed: Immodium Citrucel - 1tsp daily     _______________________________________________________  If your blood pressure at your visit was 140/90 or greater, please contact your primary care physician to follow up on this.  _______________________________________________________  If you are age 73 or older, your body mass index should be between 23-30. Your Body mass index is 29.84 kg/m. If this is out of the aforementioned range listed, please consider follow up with your Primary Care Provider.  If you are age 49 or younger, your body mass index should be between 19-25. Your Body mass index is 29.84 kg/m. If this is out of the aformentioned range listed, please consider follow up with your Primary Care Provider.   ________________________________________________________  The  [facility] providers would like to encourage you to use [facility] to communicate with providers for non-urgent requests or questions.  Due to long hold times on the telephone, sending your provider a message by [facility] may be a faster and more efficient way to get a response.  Please allow 48 business hours for a response.  Please remember that this is for non-urgent requests.  _______________________________________________________ It was a pleasure to see you today!  Thank you for trusting me with your gastrointestinal care!

## 2024-01-15 LAB — TISSUE TRANSGLUTAMINASE ABS,IGG,IGA
(tTG) Ab, IgA: 18.6 U/mL — ABNORMAL HIGH
(tTG) Ab, IgG: <1 U/mL

## 2024-01-15 LAB — IGG: IgG (Immunoglobin G), Serum: 1157 mg/dL (ref 600–1640)

## 2024-01-17 ENCOUNTER — Other Ambulatory Visit: Payer: Self-pay

## 2024-01-17 DIAGNOSIS — K900 Celiac disease: Secondary | ICD-10-CM

## 2024-01-21 ENCOUNTER — Other Ambulatory Visit (HOSPITAL_COMMUNITY): Payer: Self-pay

## 2024-01-21 ENCOUNTER — Encounter: Payer: Self-pay | Admitting: Internal Medicine

## 2024-01-21 ENCOUNTER — Other Ambulatory Visit: Payer: Self-pay

## 2024-01-21 ENCOUNTER — Ambulatory Visit (AMBULATORY_SURGERY_CENTER): Payer: MEDICAID | Admitting: Internal Medicine

## 2024-01-21 VITALS — BP 119/77 | HR 82 | Temp 97.5°F | Resp 10 | Ht 72.0 in | Wt 220.0 lb

## 2024-01-21 DIAGNOSIS — K3189 Other diseases of stomach and duodenum: Secondary | ICD-10-CM

## 2024-01-21 DIAGNOSIS — K31A0 Gastric intestinal metaplasia, unspecified: Secondary | ICD-10-CM | POA: Diagnosis not present

## 2024-01-21 DIAGNOSIS — R143 Flatulence: Secondary | ICD-10-CM

## 2024-01-21 DIAGNOSIS — K259 Gastric ulcer, unspecified as acute or chronic, without hemorrhage or perforation: Secondary | ICD-10-CM

## 2024-01-21 DIAGNOSIS — R197 Diarrhea, unspecified: Secondary | ICD-10-CM

## 2024-01-21 DIAGNOSIS — R142 Eructation: Secondary | ICD-10-CM

## 2024-01-21 DIAGNOSIS — R768 Other specified abnormal immunological findings in serum: Secondary | ICD-10-CM

## 2024-01-21 MED ORDER — PANTOPRAZOLE SODIUM 40 MG PO TBEC
40.0000 mg | DELAYED_RELEASE_TABLET | Freq: Every day | ORAL | 3 refills | Status: AC
  Filled 2024-01-21 (×3): qty 30, 30d supply, fill #0
  Filled 2024-07-05: qty 30, 30d supply, fill #1

## 2024-01-21 MED ORDER — SODIUM CHLORIDE 0.9 % IV SOLN: 500.0000 mL | Freq: Once | INTRAVENOUS | Status: DC

## 2024-01-21 NOTE — Op Note (Addendum)
 [facility] Patient Name: Ernest Mccann Procedure Date: 01/21/2024 12:11 PM MRN: [id number] Endoscopist: Rosario Estefana Kidney , , [id number] Age: 33 Referring MD:  Date of Birth: July 14, 1991 Gender: Male Account #: [id number] Procedure:                Upper GI endoscopy Indications:              Heartburn, Diarrhea Medicines:                Monitored Anesthesia Care Procedure:                Pre-Anesthesia Assessment:                           - Prior to the procedure, a History and Physical                            was performed, and patient medications and                            allergies were reviewed. The patient's tolerance of                            previous anesthesia was also reviewed. The risks                            and benefits of the procedure and the sedation                            options and risks were discussed with the patient.                            All questions were answered, and informed consent                            was obtained. Prior Anticoagulants: The patient has                            taken no anticoagulant or antiplatelet agents. ASA                            Grade Assessment: II - A patient with mild systemic                            disease. After reviewing the risks and benefits,                            the patient was deemed in satisfactory condition to                            undergo the procedure.                           After obtaining informed consent, the endoscope was  passed under direct vision. Throughout the                            procedure, the patient's blood pressure, pulse, and                            oxygen saturations were monitored continuously. The                            Olympus Scope [id number] was introduced through the                            mouth, and advanced to the second part of duodenum.                            The upper GI endoscopy  was accomplished without                            difficulty. The patient tolerated the procedure                            well. Scope In: Scope Out: Findings:                 The examined esophagus was normal.                           Retained fluid was found in the gastric body.                           A few localized erosions with no bleeding and no                            stigmata of recent bleeding were found in the                            gastric antrum. Biopsies were taken with a cold                            forceps for histology.                           Diffuse mucosal changes characterized by scalloping                            were found in the duodenal bulb and in the second                            portion of the duodenum. Biopsies were taken with a                            cold forceps for histology. Complications:            No immediate complications. Estimated Blood Loss:     Estimated blood loss was minimal. Impression:               -  Normal esophagus.                           - Retained gastric fluid.                           - Erosive gastropathy with no bleeding and no                            stigmata of recent bleeding. Biopsied.                           - Mucosal changes in the duodenum. Biopsied. Recommendation:           - Discharge patient to home (with escort).                           - Await pathology results.                           - Please start a gluten free diet.                           - Start pantoprazole  40 mg daily.                           - Return to GI clinic in 2-3 months.                           - The findings and recommendations were discussed                            with the patient. Dr Estefana Federico Rosario Estefana Federico,  01/21/2024 12:40:16 PM

## 2024-01-21 NOTE — Progress Notes (Signed)
 Called to room to assist during endoscopic procedure.  Patient ID and intended procedure confirmed with present staff. Received instructions for my participation in the procedure from the performing physician.

## 2024-01-21 NOTE — Progress Notes (Signed)
 GASTROENTEROLOGY PROCEDURE H&P NOTE   Primary Care Physician: Delbert Clam, MD    Reason for Procedure:   Positive TTG IgA, diarrhea, GERD  Plan:    EGD  Patient is appropriate for endoscopic procedure(s) in the ambulatory ([facility]) setting.  The nature of the procedure, as well as the risks, benefits, and alternatives were carefully and thoroughly reviewed with the patient. Ample time for discussion and questions allowed. The patient understood, was satisfied, and agreed to proceed.     HPI: Ernest Mccann is a 33 y.o. male who presents for EGD for positive TTG IgA, diarrhea, and GERD.  Past Medical History:  Diagnosis Date   Anxiety    Depression    Diabetes mellitus without complication (HCC)    Diabetes mellitus without complication (HCC)    Type II   Hypertension    Neuropathy    Vitamin D  deficiency     Past Surgical History:  Procedure Laterality Date   KNEE ARTHROPLASTY Left    TONSILLECTOMY      Prior to Admission medications   Medication Sig Start Date End Date Taking? Authorizing Provider  albuterol  (VENTOLIN  HFA) 108 (90 Base) MCG/ACT inhaler Inhale 2 puffs into the lungs every 6 (six) hours as needed for wheezing or shortness of breath. 03/08/23   Christopher Savannah, PA-C  Blood Glucose Monitoring Suppl (ACCU-CHEK GUIDE) w/Device KIT Use to check blood glucose 3 times a day 01/03/24   Newlin, Enobong, MD  Blood Glucose Monitoring Suppl (TRUE METRIX METER) w/Device KIT Use 3 (three) times daily before meals. 09/09/22   Newlin, Enobong, MD  Continuous Glucose Sensor (DEXCOM G7 SENSOR) MISC use as directed 12/21/23   Motwani, Komal, MD  cyclobenzaprine  (FLEXERIL ) 10 MG tablet Take 1 tablet (10 mg total) by mouth 2 (two) times daily as needed for muscle spasms. 12/14/23   Newlin, Enobong, MD  dapagliflozin  propanediol (FARXIGA ) 10 MG TABS tablet Take 1 tablet (10 mg total) by mouth daily before breakfast. 09/09/23   Newlin, Enobong, MD  diphenoxylate -atropine   (LOMOTIL ) 2.5-0.025 MG tablet Take 1 tablet by mouth 4 (four) times daily as needed for diarrhea or loose stools. 12/29/23   Newlin, Enobong, MD  ergocalciferol  (DRISDOL ) 1.25 MG (50000 UT) capsule Take 1 capsule (50,000 Units total) by mouth once a week. 01/05/24   Newlin, Enobong, MD  gabapentin  (NEURONTIN ) 300 MG capsule Take 1 capsule (300 mg total) by mouth at bedtime. 09/07/23   Newlin, Enobong, MD  Glucagon  (BAQSIMI  ONE PACK) 3 MG/DOSE POWD Place 1 Device into the nose as needed (Low blood sugar with impaired consciousness). 12/21/23   Motwani, Komal, MD  glucose blood (TRUE METRIX BLOOD GLUCOSE TEST) test strip Use 3 times daily 01/03/24   Newlin, Enobong, MD  Insulin  Glargine (BASAGLAR  KWIKPEN) 100 UNIT/ML Inject 55 Units into the skin daily. 12/14/23   Newlin, Enobong, MD  Insulin  Pen Needle (TECHLITE PEN NEEDLES) 31G X 5 MM MISC Use 1 pen needle each daily at bedtime. 09/11/22   Newlin, Enobong, MD  lisinopril  (ZESTRIL ) 10 MG tablet Take 1 tablet (10 mg total) by mouth daily. 09/07/23   Newlin, Enobong, MD  mupirocin  ointment (BACTROBAN ) 2 % Apply 1 Application topically 2 (two) times daily. 06/24/22   Mayers, Cari S, PA-C  promethazine -dextromethorphan (PROMETHAZINE -DM) 6.25-15 MG/5ML syrup Take 5 mLs by mouth 3 (three) times daily as needed for cough. 03/08/23   Christopher Savannah, PA-C  Selenium  Sulfide 2.25 % SHAM Use 2 (two) times a week. 12/09/23   Delbert Clam, MD  Semaglutide , 2 MG/DOSE, 8 MG/3ML SOPN Inject 2 mg as directed once a week. 12/14/23   Newlin, Enobong, MD  sildenafil  (VIAGRA ) 100 MG tablet Take 1 tablet (100 mg total) by mouth daily as needed for erectile dysfunction. At least 24 hours between doses 12/09/23   Newlin, Enobong, MD  traZODone  (DESYREL ) 50 MG tablet Take 1 tablet (50 mg total) by mouth at bedtime. 03/17/22   Nwoko, Uchenna E, PA  TRUEplus Lancets 28G MISC Use 3 (three) times daily before meals. 01/03/24   Newlin, Enobong, MD    Current Outpatient Medications  Medication  Sig Dispense Refill   albuterol  (VENTOLIN  HFA) 108 (90 Base) MCG/ACT inhaler Inhale 2 puffs into the lungs every 6 (six) hours as needed for wheezing or shortness of breath. 20.1 g 3   Blood Glucose Monitoring Suppl (ACCU-CHEK GUIDE) w/Device KIT Use to check blood glucose 3 times a day 1 kit 0   Blood Glucose Monitoring Suppl (TRUE METRIX METER) w/Device KIT Use 3 (three) times daily before meals. 1 kit 0   Continuous Glucose Sensor (DEXCOM G7 SENSOR) MISC use as directed 9 each 0   cyclobenzaprine  (FLEXERIL ) 10 MG tablet Take 1 tablet (10 mg total) by mouth 2 (two) times daily as needed for muscle spasms. 30 tablet 1   dapagliflozin  propanediol (FARXIGA ) 10 MG TABS tablet Take 1 tablet (10 mg total) by mouth daily before breakfast. 30 tablet 3   diphenoxylate -atropine  (LOMOTIL ) 2.5-0.025 MG tablet Take 1 tablet by mouth 4 (four) times daily as needed for diarrhea or loose stools. 30 tablet 1   ergocalciferol  (DRISDOL ) 1.25 MG (50000 UT) capsule Take 1 capsule (50,000 Units total) by mouth once a week. 12 capsule 0   gabapentin  (NEURONTIN ) 300 MG capsule Take 1 capsule (300 mg total) by mouth at bedtime. 30 capsule 6   Glucagon  (BAQSIMI  ONE PACK) 3 MG/DOSE POWD Place 1 Device into the nose as needed (Low blood sugar with impaired consciousness). 2 each 3   glucose blood (TRUE METRIX BLOOD GLUCOSE TEST) test strip Use 3 times daily 100 each 6   Insulin  Glargine (BASAGLAR  KWIKPEN) 100 UNIT/ML Inject 55 Units into the skin daily. 30 mL 3   Insulin  Pen Needle (TECHLITE PEN NEEDLES) 31G X 5 MM MISC Use 1 pen needle each daily at bedtime. 100 each 5   lisinopril  (ZESTRIL ) 10 MG tablet Take 1 tablet (10 mg total) by mouth daily. 30 tablet 6   mupirocin  ointment (BACTROBAN ) 2 % Apply 1 Application topically 2 (two) times daily. 22 g 0   promethazine -dextromethorphan (PROMETHAZINE -DM) 6.25-15 MG/5ML syrup Take 5 mLs by mouth 3 (three) times daily as needed for cough. 200 mL 0   Selenium  Sulfide 2.25 % SHAM  Use 2 (two) times a week. 180 mL 0   Semaglutide , 2 MG/DOSE, 8 MG/3ML SOPN Inject 2 mg as directed once a week. 3 mL 1   sildenafil  (VIAGRA ) 100 MG tablet Take 1 tablet (100 mg total) by mouth daily as needed for erectile dysfunction. At least 24 hours between doses 10 tablet 0   traZODone  (DESYREL ) 50 MG tablet Take 1 tablet (50 mg total) by mouth at bedtime. 30 tablet 1   TRUEplus Lancets 28G MISC Use 3 (three) times daily before meals. 100 each 6   Current Facility-Administered Medications  Medication Dose Route Frequency Provider Last Rate Last Admin   0.9 %  sodium chloride  infusion  500 mL Intravenous Once Gianfranco Araki C, MD  Allergies as of 01/21/2024   (No Known Allergies)    Family History  Problem Relation Age of Onset   Diabetes Mother    Diabetes Father    Heart disease Father    Renal Disease Father    Cirrhosis Father        had a liver transplant   Other Brother        liver fibrosis   Heart disease Maternal Grandfather    Pancreatic cancer Maternal Aunt    Breast cancer Cousin        mat side   Colon cancer Other        mat great uncle   Esophageal cancer Neg Hx     Social History   Socioeconomic History   Marital status: Divorced    Spouse name: Not on file   Number of children: 1   Years of education: Not on file   Highest education level: Some college, no degree  Occupational History   Occupation: research officer, political party  Tobacco Use   Smoking status: Never   Smokeless tobacco: Never  Vaping Use   Vaping status: Never Used  Substance and Sexual Activity   Alcohol use: Yes    Comment: super rare drinks   Drug use: Not Currently    Comment: THC   Sexual activity: Yes  Other Topics Concern   Not on file  Social History Narrative   ** Merged History Encounter **       Social Drivers of Health   Financial Resource Strain: Medium Risk (12/13/2023)   Overall Financial Resource Strain (CARDIA)    Difficulty of Paying Living  Expenses: Somewhat hard  Food Insecurity: Food Insecurity Present (12/13/2023)   Hunger Vital Sign    Worried About Running Out of Food in the Last Year: Often true    Ran Out of Food in the Last Year: Sometimes true  Transportation Needs: No Transportation Needs (12/13/2023)   PRAPARE - Administrator, Civil Service (Medical): No    Lack of Transportation (Non-Medical): No  Physical Activity: Inactive (12/13/2023)   Exercise Vital Sign    Days of Exercise per Week: 0 days    Minutes of Exercise per Session: 30 min  Stress: No Stress Concern Present (12/13/2023)   [employer] of Occupational Health - Occupational Stress Questionnaire    Feeling of Stress : Only a little  Social Connections: Moderately Integrated (12/13/2023)   Social Connection and Isolation Panel [NHANES]    Frequency of Communication with Friends and Family: More than three times a week    Frequency of Social Gatherings with Friends and Family: Twice a week    Attends Religious Services: More than 4 times per year    Active Member of [employer] or Organizations: Yes    Attends Engineer, Structural: More than 4 times per year    Marital Status: Divorced  Intimate Partner Violence: Not At Risk (12/14/2023)   Humiliation, Afraid, Rape, and Kick questionnaire    Fear of Current or Ex-Partner: No    Emotionally Abused: No    Physically Abused: No    Sexually Abused: No    Physical Exam: Vital signs in last 24 hours: BP (!) 135/52 (BP Location: Left Arm, Patient Position: Sitting, Cuff Size: Normal)   Pulse 91   Temp (!) 97.5 F (36.4 C) (Temporal)   Ht 6' (1.829 m)   Wt 220 lb (99.8 kg)   SpO2 98%   BMI 29.84 kg/m  GEN: NAD EYE: Sclerae anicteric ENT: MMM CV: Non-tachycardic Pulm: No increased work of breathing GI: Soft, NT/ND NEURO:  Alert & Oriented   Estefana Kidney, MD [facility]  01/21/2024 11:34 AM

## 2024-01-21 NOTE — Progress Notes (Signed)
 A/o x 3, VSS, gd SR's, pleased with anesthesia, report to RN

## 2024-01-21 NOTE — Patient Instructions (Signed)
**  Start a Gluten Free Diet**    YOU HAD AN ENDOSCOPIC PROCEDURE TODAY AT THE [facility]:   Refer to the procedure report that was given to you for any specific questions about what was found during the examination.  If the procedure report does not answer your questions, please call your gastroenterologist to clarify.  If you requested that your care partner not be given the details of your procedure findings, then the procedure report has been included in a sealed envelope for you to review at your convenience later.  YOU SHOULD EXPECT: Some feelings of bloating in the abdomen. Passage of more gas than usual.  Walking can help get rid of the air that was put into your GI tract during the procedure and reduce the bloating. If you had a lower endoscopy (such as a colonoscopy or flexible sigmoidoscopy) you may notice spotting of blood in your stool or on the toilet paper. If you underwent a bowel prep for your procedure, you may not have a normal bowel movement for a few days.  Please Note:  You might notice some irritation and congestion in your nose or some drainage.  This is from the oxygen used during your procedure.  There is no need for concern and it should clear up in a day or so.  SYMPTOMS TO REPORT IMMEDIATELY:  Following upper endoscopy (EGD)  Vomiting of blood or coffee ground material  New chest pain or pain under the shoulder blades  Painful or persistently difficult swallowing  New shortness of breath  Fever of 100F or higher  Black, tarry-looking stools  For urgent or emergent issues, a gastroenterologist can be reached at any hour by calling [phone]. Do not use MyChart messaging for urgent concerns.    DIET:  We do recommend a small meal at first, but then you may proceed to your regular diet.  Drink plenty of fluids but you should avoid alcoholic beverages for 24 hours.  ACTIVITY:  You should plan to take it easy for the rest of today and you should NOT  DRIVE or use heavy machinery until tomorrow (because of the sedation medicines used during the test).    FOLLOW UP: Our staff will call the number listed on your records the next business day following your procedure.  We will call around 7:15- 8:00 am to check on you and address any questions or concerns that you may have regarding the information given to you following your procedure. If we do not reach you, we will leave a message.     If any biopsies were taken you will be contacted by phone or by letter within the next 1-3 weeks.  Please call us  at [phone] if you have not heard about the biopsies in 3 weeks.    SIGNATURES/CONFIDENTIALITY: You and/or your care partner have signed paperwork which will be entered into your electronic medical record.  These signatures attest to the fact that that the information above on your After Visit Summary has been reviewed and is understood.  Full responsibility of the confidentiality of this discharge information lies with you and/or your care-partner.

## 2024-01-24 ENCOUNTER — Telehealth: Payer: Self-pay | Admitting: *Deleted

## 2024-01-24 NOTE — Telephone Encounter (Signed)
  Follow up Call-     01/21/2024   11:38 AM  Call back number  Post procedure Call Back phone  # [phone]  Permission to leave phone message Yes   [facility]

## 2024-01-25 ENCOUNTER — Encounter: Payer: Self-pay | Admitting: Internal Medicine

## 2024-01-25 LAB — SURGICAL PATHOLOGY

## 2024-01-26 ENCOUNTER — Other Ambulatory Visit: Payer: Self-pay

## 2024-01-28 ENCOUNTER — Other Ambulatory Visit: Payer: Self-pay

## 2024-01-31 ENCOUNTER — Encounter: Payer: Self-pay | Admitting: Internal Medicine

## 2024-02-03 ENCOUNTER — Other Ambulatory Visit: Payer: Self-pay | Admitting: Family Medicine

## 2024-02-03 DIAGNOSIS — L219 Seborrheic dermatitis, unspecified: Secondary | ICD-10-CM

## 2024-02-03 DIAGNOSIS — N529 Male erectile dysfunction, unspecified: Secondary | ICD-10-CM

## 2024-02-04 ENCOUNTER — Other Ambulatory Visit (HOSPITAL_COMMUNITY): Payer: Self-pay

## 2024-02-04 ENCOUNTER — Other Ambulatory Visit: Payer: Self-pay

## 2024-02-04 MED ORDER — SILDENAFIL CITRATE 100 MG PO TABS
100.0000 mg | ORAL_TABLET | Freq: Every day | ORAL | 0 refills | Status: DC | PRN
  Filled 2024-02-04: qty 10, 10d supply, fill #0

## 2024-02-04 MED ORDER — SELENIUM SULFIDE 2.25 % EX SHAM
1.0000 | MEDICATED_SHAMPOO | CUTANEOUS | 0 refills | Status: DC
  Filled 2024-02-04: qty 180, 25d supply, fill #0

## 2024-02-15 ENCOUNTER — Other Ambulatory Visit (HOSPITAL_COMMUNITY): Payer: Self-pay

## 2024-02-15 ENCOUNTER — Telehealth: Payer: Self-pay

## 2024-02-15 NOTE — Telephone Encounter (Signed)
 Pharmacy Patient Advocate Encounter   Received notification from CoverMyMeds that prior authorization for Dexcom G7 sensor is required/requested.   Insurance verification completed.   The patient is insured through [facility] .   Per test claim: PA required; PA submitted to above mentioned insurance via CoverMyMeds Key/confirmation #/EOC [id number] Status is pending

## 2024-02-15 NOTE — Telephone Encounter (Signed)
 Pharmacy Patient Advocate Encounter  Received notification from [facility] that Prior Authorization for Dexcom G7 sensor has been APPROVED through 08/17/24   PA #/Case ID/Reference #: [id number]

## 2024-03-09 ENCOUNTER — Other Ambulatory Visit (HOSPITAL_COMMUNITY): Payer: Self-pay

## 2024-03-09 ENCOUNTER — Ambulatory Visit: Payer: MEDICAID | Attending: Family Medicine | Admitting: Family Medicine

## 2024-03-09 ENCOUNTER — Other Ambulatory Visit: Payer: Self-pay

## 2024-03-09 ENCOUNTER — Encounter: Payer: Self-pay | Admitting: Family Medicine

## 2024-03-09 VITALS — BP 175/122 | HR 90 | Ht 72.0 in | Wt 221.8 lb

## 2024-03-09 DIAGNOSIS — Z794 Long term (current) use of insulin: Secondary | ICD-10-CM

## 2024-03-09 DIAGNOSIS — E1159 Type 2 diabetes mellitus with other circulatory complications: Secondary | ICD-10-CM

## 2024-03-09 DIAGNOSIS — F419 Anxiety disorder, unspecified: Secondary | ICD-10-CM | POA: Diagnosis not present

## 2024-03-09 DIAGNOSIS — Z7985 Long-term (current) use of injectable non-insulin antidiabetic drugs: Secondary | ICD-10-CM

## 2024-03-09 DIAGNOSIS — E1142 Type 2 diabetes mellitus with diabetic polyneuropathy: Secondary | ICD-10-CM | POA: Diagnosis not present

## 2024-03-09 DIAGNOSIS — F410 Panic disorder [episodic paroxysmal anxiety] without agoraphobia: Secondary | ICD-10-CM | POA: Diagnosis not present

## 2024-03-09 DIAGNOSIS — Z7984 Long term (current) use of oral hypoglycemic drugs: Secondary | ICD-10-CM

## 2024-03-09 DIAGNOSIS — F32A Depression, unspecified: Secondary | ICD-10-CM

## 2024-03-09 DIAGNOSIS — Z029 Encounter for administrative examinations, unspecified: Secondary | ICD-10-CM

## 2024-03-09 DIAGNOSIS — I152 Hypertension secondary to endocrine disorders: Secondary | ICD-10-CM

## 2024-03-09 MED ORDER — FLUOXETINE HCL 20 MG PO CAPS
20.0000 mg | ORAL_CAPSULE | Freq: Every day | ORAL | 3 refills | Status: AC
  Filled 2024-03-09: qty 30, 30d supply, fill #0
  Filled 2024-03-09: qty 90, 90d supply, fill #0
  Filled 2024-10-10 – 2024-10-11 (×2): qty 90, 90d supply, fill #1
  Filled 2024-10-11: qty 90, 90d supply, fill #0
  Filled 2025-01-16: qty 90, 90d supply, fill #1

## 2024-03-09 MED ORDER — HYDROXYZINE HCL 25 MG PO TABS
25.0000 mg | ORAL_TABLET | Freq: Three times a day (TID) | ORAL | 1 refills | Status: AC | PRN
  Filled 2024-03-09 (×2): qty 60, 20d supply, fill #0

## 2024-03-09 NOTE — Progress Notes (Signed)
 Subjective:  Patient ID: Ernest Mccann, male    DOB: [birth year]  Age: 33 y.o. MRN: [id number]  CC: PAPERWORK (Referral to get eyes checked.)     Discussed the use of AI scribe software for clinical note transcription with the patient, who gave verbal consent to proceed.  History of Present Illness The patient, with a history of type II diabetes mellitus, celiac disease, anxiety and depression and high blood pressure, presents for a routine follow-up.   He reports that his diabetes is now being managed by an endocrinologist. He has been adhering to a gluten-free diet for his new diagnosis of celiac disease and taking Lomotil and over-the-counter Imodium for symptom management.  He endorses presence of diarrhea and vomiting as symptoms of his celiac disease and is requesting completion of workplace accommodation form for his job to allow him frequent bathroom breaks as he works from home.   He has not taken his blood pressure medication due to needing a refill, resulting in elevated blood pressure readings.  The patient also expresses concerns about his mental health, revealing that he has been experiencing suicidal ideations without a plan. He attributes these thoughts to life stressors and a history of psychosis and depression. He has not been taking any medication for depression and has not been attending his appointments at [facility]. He expresses willingness to restart Prozac, a medication he had taken in the past for depression.  Additionally, the patient is concerned about possible glaucoma due to vision changes.     Past Medical History:  Diagnosis Date   Anxiety    Asthma    has inhaler   Depression    Diabetes mellitus without complication (HCC)    Diabetes mellitus without complication (HCC)    Type II   GERD (gastroesophageal reflux disease)    Hypertension    Neuropathy    Vitamin D deficiency     Past Surgical History:  Procedure  Laterality Date   KNEE ARTHROPLASTY Left    TONSILLECTOMY      Family History  Problem Relation Age of Onset   Diabetes Mother    Diabetes Father    Heart disease Father    Renal Disease Father    Cirrhosis Father        had a liver transplant   Other Brother        liver fibrosis   Pancreatic cancer Maternal Aunt    Heart disease Maternal Grandfather    Breast cancer Cousin        mat side   Colon cancer Other        mat great uncle   Esophageal cancer Neg Hx    Colon polyps Neg Hx    Rectal cancer Neg Hx    Stomach cancer Neg Hx     Social History   Socioeconomic History   Marital status: Divorced    Spouse name: Not on file   Number of children: 1   Years of education: Not on file   Highest education level: Some college, no degree  Occupational History   Occupation: Research officer, political party  Tobacco Use   Smoking status: Never   Smokeless tobacco: Never  Vaping Use   Vaping status: Never Used  Substance and Sexual Activity   Alcohol use: Yes    Comment: super rare drinks   Drug use: Not Currently    Comment: THC   Sexual activity: Yes  Other Topics Concern   Not on file  Social History Narrative   ** Merged History Encounter **       Social Drivers of Health   Financial Resource Strain: Medium Risk (12/13/2023)   Overall Financial Resource Strain (CARDIA)    Difficulty of Paying Living Expenses: Somewhat hard  Food Insecurity: Food Insecurity Present (12/13/2023)   Hunger Vital Sign    Worried About Running Out of Food in the Last Year: Often true    Ran Out of Food in the Last Year: Sometimes true  Transportation Needs: No Transportation Needs (12/13/2023)   PRAPARE - Administrator, Civil Service (Medical): No    Lack of Transportation (Non-Medical): No  Physical Activity: Inactive (12/13/2023)   Exercise Vital Sign    Days of Exercise per Week: 0 days    Minutes of Exercise per Session: 30 min  Stress: No Stress Concern  Present (12/13/2023)   [employer] of Occupational Health - Occupational Stress Questionnaire    Feeling of Stress : Only a little  Social Connections: Moderately Integrated (12/13/2023)   Social Connection and Isolation Panel [NHANES]    Frequency of Communication with Friends and Family: More than three times a week    Frequency of Social Gatherings with Friends and Family: Twice a week    Attends Religious Services: More than 4 times per year    Active Member of [employer] or Organizations: Yes    Attends Engineer, structural: More than 4 times per year    Marital Status: Divorced    No Known Allergies  Outpatient Medications Prior to Visit  Medication Sig Dispense Refill   albuterol (VENTOLIN HFA) 108 (90 Base) MCG/ACT inhaler Inhale 2 puffs into the lungs every 6 (six) hours as needed for wheezing or shortness of breath. 20.1 g 3   Blood Glucose Monitoring Suppl (ACCU-CHEK GUIDE) w/Device KIT Use to check blood glucose 3 times a day 1 kit 0   Blood Glucose Monitoring Suppl (TRUE METRIX METER) w/Device KIT Use 3 (three) times daily before meals. 1 kit 0   Continuous Glucose Sensor (DEXCOM G7 SENSOR) MISC use as directed 9 each 0   cyclobenzaprine (FLEXERIL) 10 MG tablet Take 1 tablet (10 mg total) by mouth 2 (two) times daily as needed for muscle spasms. 30 tablet 1   dapagliflozin propanediol (FARXIGA) 10 MG TABS tablet Take 1 tablet (10 mg total) by mouth daily before breakfast. 30 tablet 3   diphenoxylate-atropine (LOMOTIL) 2.5-0.025 MG tablet Take 1 tablet by mouth 4 (four) times daily as needed for diarrhea or loose stools. 30 tablet 1   ergocalciferol (DRISDOL) 1.25 MG (50000 UT) capsule Take 1 capsule (50,000 Units total) by mouth once a week. 12 capsule 0   gabapentin (NEURONTIN) 300 MG capsule Take 1 capsule (300 mg total) by mouth at bedtime. 30 capsule 6   Glucagon (BAQSIMI ONE PACK) 3 MG/DOSE POWD Place 1 Device into the nose as needed (Low blood sugar with  impaired consciousness). 2 each 3   glucose blood (TRUE METRIX BLOOD GLUCOSE TEST) test strip Use 3 times daily 100 each 6   Insulin Glargine (BASAGLAR KWIKPEN) 100 UNIT/ML Inject 55 Units into the skin daily. 30 mL 3   Insulin Pen Needle (TECHLITE PEN NEEDLES) 31G X 5 MM MISC Use 1 pen needle each daily at bedtime. 100 each 5   lisinopril (ZESTRIL) 10 MG tablet Take 1 tablet (10 mg total) by mouth daily. 30 tablet 6   mupirocin ointment (BACTROBAN) 2 % Apply 1 Application topically  2 (two) times daily. 22 g 0   pantoprazole (PROTONIX) 40 MG tablet Take 1 tablet (40 mg total) by mouth daily. 30 tablet 3   promethazine-dextromethorphan (PROMETHAZINE-DM) 6.25-15 MG/5ML syrup Take 5 mLs by mouth 3 (three) times daily as needed for cough. 200 mL 0   Selenium Sulfide 2.25 % SHAM Use 2 (two) times a week. 180 mL 0   Semaglutide, 2 MG/DOSE, 8 MG/3ML SOPN Inject 2 mg as directed once a week. 3 mL 1   sildenafil (VIAGRA) 100 MG tablet Take 1 tablet (100 mg total) by mouth daily as needed for erectile dysfunction. At least 24 hours between doses 10 tablet 0   traZODone (DESYREL) 50 MG tablet Take 1 tablet (50 mg total) by mouth at bedtime. 30 tablet 1   TRUEplus Lancets 28G MISC Use 3 (three) times daily before meals. 100 each 6   No facility-administered medications prior to visit.     ROS Review of Systems  Constitutional:  Negative for activity change and appetite change.  HENT:  Negative for sinus pressure and sore throat.   Respiratory:  Negative for chest tightness, shortness of breath and wheezing.   Cardiovascular:  Negative for chest pain and palpitations.  Gastrointestinal:  Positive for diarrhea and vomiting. Negative for abdominal distention and constipation.  Genitourinary: Negative.   Musculoskeletal: Negative.   Psychiatric/Behavioral:  Positive for dysphoric mood. Negative for behavioral problems.     Objective:  BP (!) 175/122   Pulse 90   Ht 6' (1.829 m)   Wt 221 lb 12.8 oz  (100.6 kg)   SpO2 95%   BMI 30.08 kg/m      03/09/2024    8:43 AM 01/21/2024    1:06 PM 01/21/2024   12:56 PM  BP/Weight  Systolic BP 175 119 124  Diastolic BP 122 77 72  Wt. (Lbs) 221.8    BMI 30.08 kg/m2        Physical Exam Constitutional:      Appearance: He is well-developed.  Cardiovascular:     Rate and Rhythm: Normal rate.     Heart sounds: Normal heart sounds. No murmur heard. Pulmonary:     Effort: Pulmonary effort is normal.     Breath sounds: Normal breath sounds. No wheezing or rales.  Chest:     Chest wall: No tenderness.  Abdominal:     General: Bowel sounds are normal. There is no distension.     Palpations: Abdomen is soft. There is no mass.     Tenderness: There is no abdominal tenderness.  Musculoskeletal:        General: Normal range of motion.     Right lower leg: No edema.     Left lower leg: No edema.  Neurological:     Mental Status: He is alert and oriented to person, place, and time.  Psychiatric:     Comments: Dysphoric mood        Latest Ref Rng & Units 12/17/2023    9:03 AM 09/07/2023    4:24 PM 09/09/2022   10:12 AM  CMP  Glucose 65 - 99 mg/dL 098  119  147   BUN 7 - 25 mg/dL 12  12  12    Creatinine 0.60 - 1.26 mg/dL 8.29  5.62  1.30   Sodium 135 - 146 mmol/L 142  138  136   Potassium 3.5 - 5.3 mmol/L 4.9  4.4  5.1   Chloride 98 - 110 mmol/L 106  97  97  CO2 20 - 32 mmol/L 27  23  23    Calcium 8.6 - 10.3 mg/dL 8.7  9.3  13.2   Total Protein 6.1 - 8.1 g/dL 6.7  6.7    Total Bilirubin 0.2 - 1.2 mg/dL 0.3  0.2    Alkaline Phos 44 - 121 IU/L  113    AST 10 - 40 U/L 14  15    ALT 9 - 46 U/L 18  26      Lipid Panel     Component Value Date/Time   CHOL 181 12/17/2023 0903   CHOL 197 03/02/2022 1001   TRIG 157 (H) 12/17/2023 0903   HDL 37 (L) 12/17/2023 0903   HDL 35 (L) 03/02/2022 1001   CHOLHDL 4.9 12/17/2023 0903   VLDL 41 (H) 08/15/2014 1211   LDLCALC 117 (H) 12/17/2023 0903    CBC    Component Value Date/Time   WBC  5.9 09/07/2023 1624   WBC 4.1 04/12/2021 1534   RBC 4.85 09/07/2023 1624   RBC 4.86 04/12/2021 1534   HGB 13.4 09/07/2023 1624   HCT 41.5 09/07/2023 1624   PLT 388 09/07/2023 1624   MCV 86 09/07/2023 1624   MCH 27.6 09/07/2023 1624   MCH 27.6 04/12/2021 1534   MCHC 32.3 09/07/2023 1624   MCHC 35.0 04/12/2021 1534   RDW 12.6 09/07/2023 1624   LYMPHSABS 2.6 09/07/2023 1624   MONOABS 0.4 07/02/2015 2316   EOSABS 0.2 09/07/2023 1624   BASOSABS 0.0 09/07/2023 1624    Lab Results  Component Value Date   HGBA1C 10.4 (H) 12/17/2023       03/09/2024    8:45 AM 12/14/2023   11:05 AM 09/07/2023    3:28 PM 01/08/2023    8:30 AM 10/09/2022    9:06 AM  Depression screen PHQ 2/9  Decreased Interest 3 0 0    Down, Depressed, Hopeless 3 0 0    PHQ - 2 Score 6 0 0    Altered sleeping 3 0     Tired, decreased energy 3 0     Change in appetite 2 0     Feeling bad or failure about yourself  3 0     Trouble concentrating 3 1     Moving slowly or fidgety/restless 3 0     Suicidal thoughts 3 0     PHQ-9 Score 26 1     Difficult doing work/chores          Information is confidential and restricted. Go to Review Flowsheets to unlock data.       Assessment & Plan Depression  Reports suicidal ideations without plans. Agreed to restart Prozac and referral to behavioral health. -PHQ-9 of 26 - Prescribe Prozac and send prescription to pharmacy. - Refer to [facility] for further management. - Provide crisis line and urgent care information for immediate support.  Anxiety with Panic Attacks Experiences anxiety and panic attacks. Hydroxyzine prescribed for acute symptoms with caution regarding drowsiness. - Prescribe hydroxyzine for anxiety and panic attacks, advising use when not working to assess drowsiness. - Send prescription for hydroxyzine to pharmacy.  Celiac Disease Recently diagnosed with celiac disease. Manages symptoms with Lomotil and Imodium. Requires  work accommodation for frequent bathroom use. - Complete work accommodation form for frequent bathroom use due to celiac disease. -Adhere to a gluten-free diet - Discuss symptoms with gastroenterology for further management.  Hypertension Blood pressure elevated today. Did not take medication this morning due to needing  a refill. Previous readings in February were normal. - Instruct to contact pharmacy for medication refill as there are five refills remaining. - Schedule follow-up in one month to reassess blood pressure.  Diabetic Eye Disease Concern about potential glaucoma and uncontrolled blood sugar affecting vision. Referral to ophthalmologist needed. - Refer to ophthalmologist for eye examination and evaluation for glaucoma and diabetic eye disease.  Type 2 diabetes mellitus Uncontrolled with A1c of 10.4 Continue current medication and management per endocrine    Meds ordered this encounter  Medications   FLUoxetine (PROZAC) 20 MG tablet    Sig: Take 1 tablet (20 mg total) by mouth daily.    Dispense:  90 tablet    Refill:  3   hydrOXYzine (ATARAX) 25 MG tablet    Sig: Take 1 tablet (25 mg total) by mouth 3 (three) times daily as needed for anxiety.    Dispense:  60 tablet    Refill:  1    Follow-up: Return in about 1 month (around 04/09/2024) for Blood Pressure follow-up with PCP.       Hoy Register, MD, FAAFP. [facility] [place], [place] [phone]   03/09/2024, 9:13 AM

## 2024-03-09 NOTE — Patient Instructions (Signed)
 VISIT SUMMARY:  During your visit today, we discussed several health concerns including your mental health, celiac disease, high blood pressure, and potential eye issues. We also addressed your need for a work accommodation form due to frequent bathroom breaks. We have made several plans to help manage these issues and ensure you receive the appropriate care.  YOUR PLAN:  -DEPRESSION WITH SUICIDAL IDEATION: Depression is a mental health condition characterized by persistent feelings of sadness and loss of interest. You reported having suicidal thoughts without a plan. We have prescribed Prozac to help manage your depression and referred you to [facility] for further support. Please use the crisis line or urgent care if you need immediate help.  -ANXIETY WITH PANIC ATTACKS: Anxiety is a feeling of worry or fear that can be intense and overwhelming. Panic attacks are sudden episodes of intense fear. We have prescribed hydroxyzine to help manage your anxiety and panic attacks. Please take it when you are not working initially to see how it affects you, as it may cause drowsiness.  -CELIAC DISEASE: Celiac disease is a condition where eating gluten causes damage to the small intestine. You are managing your symptoms with Lomotil and Imodium. We have completed a work accommodation form for you to have frequent bathroom breaks. We also recommend discussing your symptoms with a gastroenterologist for further management.  -HYPERTENSION: Hypertension, or high blood pressure, is a condition where the force of the blood against your artery walls is too high. Your blood pressure was elevated today because you missed your medication. Please contact your pharmacy for a refill as there are five refills remaining. We will reassess your blood pressure in one month.  -DIABETIC EYE DISEASE: Diabetic eye disease includes a group of eye problems that people with diabetes may face as a complication of  the disease. You are concerned about potential glaucoma and vision changes. We have referred you to an ophthalmologist for a thorough eye examination and evaluation for glaucoma and diabetic eye disease.  INSTRUCTIONS:  Please follow up with the pharmacy to refill your blood pressure medication and take it as prescribed. Schedule an appointment with the ophthalmologist for your eye examination. We will see you again in one month to reassess your blood pressure. Additionally, please contact [facility] to continue your mental health care and use the crisis line if you need immediate support.

## 2024-03-13 ENCOUNTER — Ambulatory Visit: Payer: MEDICAID | Admitting: Family Medicine

## 2024-04-05 ENCOUNTER — Other Ambulatory Visit: Payer: Self-pay | Admitting: Family Medicine

## 2024-04-05 ENCOUNTER — Ambulatory Visit: Payer: MEDICAID | Admitting: "Endocrinology

## 2024-04-05 ENCOUNTER — Other Ambulatory Visit: Payer: Self-pay

## 2024-04-05 ENCOUNTER — Other Ambulatory Visit (HOSPITAL_COMMUNITY): Payer: Self-pay

## 2024-04-05 DIAGNOSIS — N529 Male erectile dysfunction, unspecified: Secondary | ICD-10-CM

## 2024-04-05 MED ORDER — DAPAGLIFLOZIN PROPANEDIOL 10 MG PO TABS
10.0000 mg | ORAL_TABLET | Freq: Every day | ORAL | 3 refills | Status: DC
  Filled 2024-04-05: qty 30, 30d supply, fill #0

## 2024-04-05 MED ORDER — ERGOCALCIFEROL 1.25 MG (50000 UT) PO CAPS
50000.0000 [IU] | ORAL_CAPSULE | ORAL | 0 refills | Status: DC
  Filled 2024-04-05: qty 12, 84d supply, fill #0

## 2024-04-05 MED ORDER — SILDENAFIL CITRATE 100 MG PO TABS
100.0000 mg | ORAL_TABLET | Freq: Every day | ORAL | 0 refills | Status: DC | PRN
  Filled 2024-04-05: qty 10, 10d supply, fill #0

## 2024-04-06 ENCOUNTER — Other Ambulatory Visit (HOSPITAL_COMMUNITY): Payer: Self-pay

## 2024-04-10 ENCOUNTER — Other Ambulatory Visit (HOSPITAL_COMMUNITY): Payer: Self-pay

## 2024-04-12 ENCOUNTER — Other Ambulatory Visit (HOSPITAL_COMMUNITY): Payer: Self-pay

## 2024-04-13 ENCOUNTER — Other Ambulatory Visit (HOSPITAL_COMMUNITY): Payer: Self-pay

## 2024-04-13 ENCOUNTER — Other Ambulatory Visit: Payer: Self-pay

## 2024-04-27 ENCOUNTER — Ambulatory Visit (HOSPITAL_COMMUNITY): Payer: MEDICAID | Admitting: Licensed Clinical Social Worker

## 2024-04-28 ENCOUNTER — Other Ambulatory Visit: Payer: Self-pay

## 2024-04-28 ENCOUNTER — Other Ambulatory Visit: Payer: Self-pay | Admitting: Family Medicine

## 2024-04-28 ENCOUNTER — Ambulatory Visit (HOSPITAL_COMMUNITY): Payer: MEDICAID | Admitting: Licensed Clinical Social Worker

## 2024-04-28 MED ORDER — DIPHENOXYLATE-ATROPINE 2.5-0.025 MG PO TABS: 1.0000 | ORAL_TABLET | Freq: Four times a day (QID) | ORAL | 1 refills | Status: DC | PRN

## 2024-04-28 MED ORDER — DIPHENOXYLATE-ATROPINE 2.5-0.025 MG PO TABS: 1.0000 | ORAL_TABLET | Freq: Four times a day (QID) | ORAL | 1 refills | Status: AC | PRN

## 2024-04-28 NOTE — Addendum Note (Signed)
 Addended by: Theotis Flake F on: 04/28/2024 03:11 PM   Modules accepted: Orders

## 2024-05-02 ENCOUNTER — Ambulatory Visit: Payer: MEDICAID | Admitting: Family Medicine
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
## 2024-05-17 ENCOUNTER — Ambulatory Visit (HOSPITAL_COMMUNITY): Payer: MEDICAID | Admitting: Psychiatry

## 2024-05-17 ENCOUNTER — Ambulatory Visit: Payer: MEDICAID | Admitting: "Endocrinology

## 2024-05-26 ENCOUNTER — Other Ambulatory Visit: Payer: Self-pay | Admitting: Family Medicine

## 2024-05-26 ENCOUNTER — Other Ambulatory Visit: Payer: Self-pay

## 2024-05-26 DIAGNOSIS — N529 Male erectile dysfunction, unspecified: Secondary | ICD-10-CM

## 2024-05-26 MED ORDER — SILDENAFIL CITRATE 100 MG PO TABS
100.0000 mg | ORAL_TABLET | Freq: Every day | ORAL | 0 refills | Status: DC | PRN
  Filled 2024-05-26: qty 10, 10d supply, fill #0

## 2024-07-05 ENCOUNTER — Other Ambulatory Visit: Payer: Self-pay

## 2024-07-05 ENCOUNTER — Other Ambulatory Visit: Payer: Self-pay | Admitting: Family Medicine

## 2024-07-05 DIAGNOSIS — N529 Male erectile dysfunction, unspecified: Secondary | ICD-10-CM

## 2024-07-05 DIAGNOSIS — Z794 Long term (current) use of insulin: Secondary | ICD-10-CM

## 2024-07-06 ENCOUNTER — Ambulatory Visit (INDEPENDENT_AMBULATORY_CARE_PROVIDER_SITE_OTHER): Admitting: Licensed Clinical Social Worker

## 2024-07-06 DIAGNOSIS — F411 Generalized anxiety disorder: Secondary | ICD-10-CM | POA: Diagnosis not present

## 2024-07-06 DIAGNOSIS — F333 Major depressive disorder, recurrent, severe with psychotic symptoms: Secondary | ICD-10-CM | POA: Diagnosis not present

## 2024-07-06 NOTE — Progress Notes (Signed)
 THERAPIST PROGRESS NOTE  Virtual Visit via Video Note  I connected with Raed A Janusz on 07/06/24 at  9:00 AM EDT by a video enabled telemedicine application and verified that I am speaking with the correct person using two identifiers.  Location: Patient: [facility]  Provider: Providers Home office    I discussed the limitations of evaluation and management by telemedicine and the availability of in person appointments. The patient expressed understanding and agreed to proceed.     I discussed the assessment and treatment plan with the patient. The patient was provided an opportunity to ask questions and all were answered. The patient agreed with the plan and demonstrated an understanding of the instructions.   The patient was advised to call back or seek an in-person evaluation if the symptoms worsen or if the condition fails to improve as anticipated.  I provided 45 minutes of non-face-to-face time during this encounter.   Juliene GORMAN Patee, LCSW   Participation Level: Active  Behavioral Response: CasualAlertAnxious and Depressed  Type of Therapy: Individual Therapy  Treatment Goals addressed:  Active     Anxiety Disorder CCP Problem  1 GAD     identify 3 triggers for anxiety  (Progressing)     Start:  03/20/22    Expected End:  11/10/24         LTG: Patient will score less than 5 on the Generalized Anxiety Disorder 7 Scale (GAD-7) (Progressing)     Start:  03/20/22    Expected End:  11/10/24         STG: Patient will practice problem solving skills 3 times per week for the next 4 weeks (Progressing)     Start:  03/20/22    Expected End:  11/10/24         Discuss risks and benefits of medication treatment options for this problem and prescribe as indicated (Completed)     Start:  03/20/22    End:  05/01/22      Encourage patient to take psychotropic medication as prescribed (Completed)     Start:  03/20/22    End:  05/01/22      Review  results of GAD-7 with the patient to track progress (Completed)     Start:  03/20/22    End:  05/01/22      Work with patient to track symptoms, triggers and/or skill use through a mood chart, diary card, or journal (Completed)     Start:  03/20/22    End:  01/08/23      Perform psychoeducation regarding anxiety disorders (Completed)     Start:  03/20/22    End:  05/01/22        Depression CCP Problem  1 MDD       walk 2 x weekly  (Progressing)     Start:  03/20/22    Expected End:  11/10/24         LTG: Aldrin WILL SCORE LESS THAN 10 ON THE PATIENT HEALTH QUESTIONNAIRE (PHQ-9) (Progressing)     Start:  03/20/22    Expected End:  11/10/24         STG: Bertil WILL ATTEND AT LEAST 80% OF SCHEDULED GROUP PSYCHOTHERAPY SESSIONS (Progressing)     Start:  03/20/22    Expected End:  11/10/24         STG: Patrich WILL COMPLETE AT LEAST 80% OF ASSIGNED HOMEWORK (Progressing)     Start:  03/20/22    Expected End:  11/10/24  Identify 3 triggers for depression  (Progressing)     Start:  03/20/22    Expected End:  11/10/24            ENCOURAGE Quang TO PARTICIPATE IN RECOVERY PEER SUPPORT ACTIVITIES WEEKLY     Start:  03/20/22         PROVIDE Sundance WITH EDUCATIONAL INFORMATION AND READING MATERIAL ON DISSOCIATION, ITS CAUSES, AND SYMPTOMS     Start:  03/20/22         WORK WITH Mikhai TO TRACK SYMPTOMS, TRIGGERS AND/OR SKILL USE THROUGH A MOOD CHART, DIARY CARD, OR JOURNAL (Completed)     Start:  03/20/22    End:  01/08/23      Administer the PHQ-9 or MADRS weekly for 4 weeks (Completed)     Start:  03/20/22    End:  05/01/22      WORK WITH Fernand TO IDENTIFY THE MAJOR COMPONENTS OF A RECENT EPISODE OF DEPRESSION: PHYSICAL SYMPTOMS, MAJOR THOUGHTS AND IMAGES, AND MAJOR BEHAVIORS THEY EXPERIENCED (Completed)     Start:  03/20/22    End:  05/01/22         ProgressTowards Goals: Progressing  Interventions: CBT, Motivational  Interviewing, and Supportive Suicidal/Homicidal: Nowithout intent/plan  Therapist Response:   Reznor was alert and oriented x 5.  He was pleasant, cooperative, maintained good eye contact.  He engaged well in therapy session was dressed casually.  He presented with anxious mood\affect.  Patient comes in today to reestablish care due to needing a reevaluation mental health which was court ordered.  He reports that he tried to give him his old assessment, but it was not up to date, and they would like something more recent.  Barnabas endorses symptoms for ADHD and LCSW provided him resources for ADHD testing.  He has been struggling with his current fianc.  Quandarius states that there are communication issues, and they are currently separated.  Patient is currently living on his own and she is living in her own place.  They are attempting to engage in couples therapy and starting to work on themselves.  Kevork states that she has narcissistic tendencies, and he has anger outbursts.  Patient states that they were not talking for a little while and then reengaged and she went through his phone and saw that he was texting other girls.  Jayleon states that he thought that they were broken up at the time and was looking for attention.  Patient reports anger that he got caught but also frustration that she went through his phone.  Intervention/plan: LCSW utilized psychoanalytic therapy for patient to express thoughts, feelings and emotions and nonjudgmental environment.  LCSW utilized supportive therapy for praise and encouragement.  LCSW educated patient on communication skills.  LCSW educated patient on the benefits of self-care.  LCSW educated patient on the stages of change.  LCSW utilized motivational interviewing for open-ended questions, reflective listening, and positive affirmations.  LCSW utilized strength-based therapy to review future goals both long-term and short-term.   Plan: Return again  in 4 weeks.  Diagnosis: Severe episode of recurrent major depressive disorder, with psychotic features (HCC)  GAD (generalized anxiety disorder)  Collaboration of Care: Other None today   Patient/Guardian was advised Release of Information must be obtained prior to any record release in order to collaborate their care with an outside provider. Patient/Guardian was advised if they have not already done so to contact the registration department to sign all necessary forms in order for us   to release information regarding their care.   Consent: Patient/Guardian gives verbal consent for treatment and assignment of benefits for services provided during this visit. Patient/Guardian expressed understanding and agreed to proceed.   Juliene GORMAN Patee, LCSW 07/06/2024

## 2024-07-31 ENCOUNTER — Ambulatory Visit (HOSPITAL_COMMUNITY): Admitting: Psychiatry

## 2024-08-04 ENCOUNTER — Telehealth (HOSPITAL_COMMUNITY): Payer: Self-pay | Admitting: Licensed Clinical Social Worker

## 2024-08-04 ENCOUNTER — Ambulatory Visit (HOSPITAL_COMMUNITY): Payer: Self-pay | Admitting: Licensed Clinical Social Worker

## 2024-08-04 ENCOUNTER — Encounter (HOSPITAL_COMMUNITY): Payer: Self-pay

## 2024-08-04 NOTE — Telephone Encounter (Signed)
 LCSw sent two links to phone number listed in epic with n response. Links were sent at 0801 and 0805. LCSW f/u with PC at 0810 and LCSW left HIPAA compliant VM. LCSW waited until 0816 before disconnecting.

## 2024-08-11 ENCOUNTER — Ambulatory Visit (HOSPITAL_COMMUNITY): Admitting: Psychiatry

## 2024-08-17 ENCOUNTER — Encounter (HOSPITAL_COMMUNITY): Payer: Self-pay

## 2024-08-17 ENCOUNTER — Ambulatory Visit (HOSPITAL_COMMUNITY): Admitting: Psychiatry

## 2024-09-08 ENCOUNTER — Ambulatory Visit: Payer: Self-pay
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
## 2024-09-08 NOTE — Telephone Encounter (Signed)
 Patient was called and offered an appointment for 09/12/24 and patient declined and states that he will go to urgent care today.
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
## 2024-10-10 ENCOUNTER — Encounter: Payer: Self-pay | Admitting: Physician Assistant

## 2024-10-10 ENCOUNTER — Inpatient Hospital Stay: Admitting: Family Medicine

## 2024-10-10 ENCOUNTER — Telehealth: Payer: Self-pay | Admitting: Physician Assistant

## 2024-10-10 ENCOUNTER — Other Ambulatory Visit: Payer: Self-pay

## 2024-10-10 ENCOUNTER — Other Ambulatory Visit: Payer: Self-pay | Admitting: Family Medicine

## 2024-10-10 VITALS — BP 146/91 | HR 95 | Ht 72.0 in | Wt 222.0 lb

## 2024-10-10 DIAGNOSIS — Z794 Long term (current) use of insulin: Secondary | ICD-10-CM

## 2024-10-10 DIAGNOSIS — E1165 Type 2 diabetes mellitus with hyperglycemia: Secondary | ICD-10-CM

## 2024-10-10 DIAGNOSIS — E1159 Type 2 diabetes mellitus with other circulatory complications: Secondary | ICD-10-CM

## 2024-10-10 DIAGNOSIS — E1142 Type 2 diabetes mellitus with diabetic polyneuropathy: Secondary | ICD-10-CM

## 2024-10-10 DIAGNOSIS — R739 Hyperglycemia, unspecified: Secondary | ICD-10-CM

## 2024-10-10 DIAGNOSIS — Z7984 Long term (current) use of oral hypoglycemic drugs: Secondary | ICD-10-CM

## 2024-10-10 DIAGNOSIS — L219 Seborrheic dermatitis, unspecified: Secondary | ICD-10-CM

## 2024-10-10 DIAGNOSIS — I152 Hypertension secondary to endocrine disorders: Secondary | ICD-10-CM

## 2024-10-10 DIAGNOSIS — Z7985 Long-term (current) use of injectable non-insulin antidiabetic drugs: Secondary | ICD-10-CM

## 2024-10-10 DIAGNOSIS — I10 Essential (primary) hypertension: Secondary | ICD-10-CM

## 2024-10-10 DIAGNOSIS — Z5900 Homelessness unspecified: Secondary | ICD-10-CM

## 2024-10-10 DIAGNOSIS — L03031 Cellulitis of right toe: Secondary | ICD-10-CM

## 2024-10-10 DIAGNOSIS — N529 Male erectile dysfunction, unspecified: Secondary | ICD-10-CM

## 2024-10-10 DIAGNOSIS — F333 Major depressive disorder, recurrent, severe with psychotic symptoms: Secondary | ICD-10-CM

## 2024-10-10 LAB — POCT GLYCOSYLATED HEMOGLOBIN (HGB A1C)
HbA1c POC (<> result, manual entry): 13.2 % (ref 4.0–5.6)
HbA1c, POC (controlled diabetic range): 13.2 % — AB (ref 0.0–7.0)
HbA1c, POC (prediabetic range): 13.2 % — AB (ref 5.7–6.4)
Hemoglobin A1C: 13.2 % — AB (ref 4.0–5.6)

## 2024-10-10 LAB — POCT GLUCOSE FINGERSTICK: Glucose: 488 — AB (ref 70–99)

## 2024-10-10 MED ORDER — INSULIN ASPART 100 UNIT/ML IJ SOLN
10.0000 [IU] | Freq: Once | INTRAMUSCULAR | Status: AC
  Administered 2024-10-10: 10 [IU] via SUBCUTANEOUS

## 2024-10-10 MED ORDER — BASAGLAR KWIKPEN 100 UNIT/ML ~~LOC~~ SOPN
55.0000 [IU] | PEN_INJECTOR | Freq: Every day | SUBCUTANEOUS | 1 refills | Status: AC
  Filled 2024-10-10: qty 15, 27d supply, fill #0
  Filled 2025-01-16: qty 15, 27d supply, fill #1

## 2024-10-10 MED ORDER — LISINOPRIL 10 MG PO TABS
10.0000 mg | ORAL_TABLET | Freq: Every day | ORAL | 1 refills | Status: AC
  Filled 2024-10-10: qty 30, 30d supply, fill #0
  Filled 2025-01-16: qty 30, 30d supply, fill #1

## 2024-10-10 MED ORDER — TECHLITE PEN NEEDLES 31G X 5 MM MISC
1.0000 | Freq: Every day | 5 refills | Status: AC
  Filled 2024-10-10 – 2025-01-16 (×3): qty 100, 90d supply, fill #0

## 2024-10-10 MED ORDER — TRUE METRIX BLOOD GLUCOSE TEST VI STRP
1.0000 | ORAL_STRIP | Freq: Two times a day (BID) | 12 refills | Status: AC
  Filled 2024-10-10 – 2024-10-11 (×3): qty 100, 50d supply, fill #0
  Filled 2025-01-16: qty 100, 50d supply, fill #1

## 2024-10-10 MED ORDER — TRULICITY 0.75 MG/0.5ML ~~LOC~~ SOAJ
0.7500 mg | SUBCUTANEOUS | 1 refills | Status: AC
  Filled 2024-10-10: qty 2, 28d supply, fill #0
  Filled 2025-01-16: qty 2, 28d supply, fill #1

## 2024-10-10 MED ORDER — SELENIUM SULFIDE 2.25 % EX SHAM
1.0000 | MEDICATED_SHAMPOO | CUTANEOUS | 0 refills | Status: AC
  Filled 2024-10-10 – 2025-01-16 (×2): qty 180, 25d supply, fill #0

## 2024-10-10 MED ORDER — DAPAGLIFLOZIN PROPANEDIOL 10 MG PO TABS
10.0000 mg | ORAL_TABLET | Freq: Every day | ORAL | 1 refills | Status: AC
  Filled 2024-10-10 – 2025-01-16 (×4): qty 30, 30d supply, fill #0

## 2024-10-10 MED ORDER — TRUEPLUS LANCETS 28G MISC
1.0000 | Freq: Two times a day (BID) | 11 refills | Status: AC
  Filled 2024-10-10 – 2024-10-11 (×2): qty 100, 50d supply, fill #0
  Filled 2025-01-16: qty 100, fill #0

## 2024-10-10 MED ORDER — DOXYCYCLINE HYCLATE 100 MG PO CAPS
100.0000 mg | ORAL_CAPSULE | Freq: Two times a day (BID) | ORAL | 0 refills | Status: AC
  Filled 2024-10-10 – 2025-01-16 (×3): qty 20, 10d supply, fill #0

## 2024-10-10 MED ORDER — TRUE METRIX METER W/DEVICE KIT
1.0000 [IU] | PACK | Freq: Once | 0 refills | Status: AC
  Filled 2024-10-10: qty 1, 30d supply, fill #0
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
## 2024-10-10 NOTE — Patient Instructions (Signed)
 You are going to restart your Jardiance and nighttime insulin .  You will also start taking Trulicity  once a week.  I do encourage you to check your blood sugar levels at home, keep a written log and have available for all office visits.  You are in a follow-[facility] for further evaluation.  You are going to restart your lisinopril  for your blood pressure.  If you are able to, please check your blood pressure at home, keep a written log and have available for all office visits.  To help with your toe infection, you are going to take doxycycline  twice a day for 10 days.  We will call you with today's lab results.  Please let us  know if there is anything else we can do for you.  Kirk CANDIE Sage, PA-C Physician Assistant [facility] [email or web address]  Nail Bed Injury  The nail bed is the soft tissue under a fingernail or toenail that lets the nail grow. The nail bed can be injured in many ways. You may: Have bruising or bleeding under the nail. Have cuts in the nail or nail bed. Lose all or part of the nail. After your nail is hurt or torn off, it can take many months to grow again. The nail may not grow back normally. What are the causes? This condition is usually caused when the nail on a fingertip or toe gets crushed, pinched, cut, or torn. These injuries might happen when a finger or toe gets: Caught in a door. Hit by a hammer. Damaged in accidents while you are using power tools or machinery. What are the signs or symptoms? Symptoms vary depending on the type of injury. Symptoms may include: Pain in the injured area. Bleeding. Swelling. A change in color of the area. Collection of blood under the nail (hematoma). Damage to the nail, such as: A nail that is an unusual shape. A split nail. A loose nail that is not stuck to the nail bed. Loss of all or part of the  nail. In some cases, a nail bed injury happens along with another injury, such as a break (fracture)of the bone at the tip of the finger or toe. How is this treated? Treatment may depend on the type of injury. Some injuries may not need any treatment other than keeping the area clean. Treatment may include: Draining blood from under the nail. This can be done by making a small hole in the nail. Removing all or part of your nail. This might be done to stitch (suture) any cut in the nail bed. Stitching a torn-off nail back in place. Putting bandages (dressings) or splints on the area. Taking medicines, such as: Antibiotics to help prevent infection. Pain medicine. Having a tetanus shot. This may be needed if you have not had a tetanus shot in the last 10 years. For some injuries, your doctor may tell you to see a hand or foot specialist. Follow these instructions at home: Managing pain, stiffness, and swelling Raise (elevate) the injured area above the level of your heart while you are sitting or lying down. Keep your injury protected with dressings or splints as told by your doctor. For an injured toenail: Try not to walk on the affected foot. Wear an open-toed shoe when you walk. Try not to let your leg hang down (dangle) when you are sitting or lying down. Wound care Follow instructions from your doctor about how to  take care of your wound. Make sure you: Wash your hands with soap and water for at least 20 seconds before and after you change your bandage. If you cannot use soap and water, use hand sanitizer. Change your bandage. Leave sutures or skin glue in place for at least 2 weeks. Leave tape strips alone unless you are told to take them off. You may trim the edges of the tape strips if they curl up. Check the injured area every day for signs of infection. Check for: More redness, swelling, or pain. More fluid or blood. Warmth. Pus or a bad smell. Keep the injured area  clean. Keep dressings clean and dry. General instructions Take over-the-counter and prescription medicines only as told by your doctor. If you were prescribed antibiotics, take them as told by your doctor. Do not stop taking them even if you start to feel better. Contact a doctor if: Medicine does not help your pain. You have signs of infection around your injured nail. You have a fever and your symptoms get worse. You lose feeling (have numbness) in your finger or toe. Your finger or toe turns blue. This information is not intended to replace advice given to you by your health care provider. Make sure you discuss any questions you have with your health care provider. Document Revised: 08/19/2022 Document Reviewed: 08/19/2022 Elsevier Patient Education  2024 [employer].

## 2024-10-11 ENCOUNTER — Other Ambulatory Visit: Payer: Self-pay

## 2024-10-11 ENCOUNTER — Other Ambulatory Visit (HOSPITAL_COMMUNITY): Payer: Self-pay

## 2024-10-11 ENCOUNTER — Ambulatory Visit: Payer: Self-pay | Admitting: Physician Assistant

## 2024-10-11 ENCOUNTER — Encounter: Payer: Self-pay | Admitting: Pharmacist

## 2024-10-11 DIAGNOSIS — E559 Vitamin D deficiency, unspecified: Secondary | ICD-10-CM

## 2024-10-11 LAB — COMP. METABOLIC PANEL (12)
AST: 13 IU/L (ref 0–40)
Albumin: 4.6 g/dL (ref 4.1–5.1)
Alkaline Phosphatase: 150 IU/L — ABNORMAL HIGH (ref 47–123)
BUN/Creatinine Ratio: 22 — ABNORMAL HIGH (ref 9–20)
BUN: 19 mg/dL (ref 6–20)
Bilirubin Total: 0.3 mg/dL (ref 0.0–1.2)
Calcium: 10.2 mg/dL (ref 8.7–10.2)
Chloride: 93 mmol/L — ABNORMAL LOW (ref 96–106)
Creatinine, Ser: 0.88 mg/dL (ref 0.76–1.27)
Globulin, Total: 2.7 g/dL (ref 1.5–4.5)
Glucose: 515 mg/dL (ref 70–99)
Potassium: 4.8 mmol/L (ref 3.5–5.2)
Sodium: 134 mmol/L (ref 134–144)
Total Protein: 7.3 g/dL (ref 6.0–8.5)
eGFR: 117 mL/min/1.73 (ref >59)

## 2024-10-11 LAB — CBC WITH DIFFERENTIAL/PLATELET
Basophils Absolute: 0 x10E3/uL (ref 0.0–0.2)
Basos: 1 %
EOS (ABSOLUTE): 0.3 x10E3/uL (ref 0.0–0.4)
Eos: 6 %
Hematocrit: 47.9 % (ref 37.5–51.0)
Hemoglobin: 15.2 g/dL (ref 13.0–17.7)
Immature Grans (Abs): 0 x10E3/uL (ref 0.0–0.1)
Immature Granulocytes: 0 %
Lymphocytes Absolute: 2 x10E3/uL (ref 0.7–3.1)
Lymphs: 37 %
MCH: 27.6 pg (ref 26.6–33.0)
MCHC: 31.7 g/dL (ref 31.5–35.7)
MCV: 87 fL (ref 79–97)
Monocytes Absolute: 0.4 x10E3/uL (ref 0.1–0.9)
Monocytes: 7 %
Neutrophils Absolute: 2.6 x10E3/uL (ref 1.4–7.0)
Neutrophils: 49 %
Platelets: 375 x10E3/uL (ref 150–450)
RBC: 5.51 x10E6/uL (ref 4.14–5.80)
RDW: 13.2 % (ref 11.6–15.4)
WBC: 5.3 x10E3/uL (ref 3.4–10.8)

## 2024-10-11 LAB — VITAMIN D 25 HYDROXY (VIT D DEFICIENCY, FRACTURES): Vit D, 25-Hydroxy: 12.8 ng/mL — ABNORMAL LOW (ref 30.0–100.0)

## 2024-10-11 MED ORDER — ERGOCALCIFEROL 1.25 MG (50000 UT) PO CAPS
50000.0000 [IU] | ORAL_CAPSULE | ORAL | 2 refills | Status: AC
  Filled 2024-10-11 – 2025-01-16 (×3): qty 4, 28d supply, fill #0

## 2024-10-11 NOTE — Progress Notes (Signed)
 Pt is aware of test results and providers recommendations

## 2024-10-12 ENCOUNTER — Other Ambulatory Visit: Payer: Self-pay

## 2024-10-12 ENCOUNTER — Other Ambulatory Visit (HOSPITAL_COMMUNITY): Payer: Self-pay

## 2024-10-12 MED ORDER — SILDENAFIL CITRATE 100 MG PO TABS
100.0000 mg | ORAL_TABLET | Freq: Every day | ORAL | 1 refills | Status: AC | PRN
  Filled 2024-10-12: qty 10, 10d supply, fill #0
  Filled 2025-01-16: qty 10, 10d supply, fill #1

## 2024-10-16 ENCOUNTER — Other Ambulatory Visit: Payer: Self-pay

## 2024-10-19 ENCOUNTER — Other Ambulatory Visit: Payer: Self-pay

## 2024-10-26 ENCOUNTER — Telehealth: Payer: Self-pay | Admitting: Family Medicine

## 2024-10-26 NOTE — Telephone Encounter (Signed)
 LVM on sister machine to confirm appt for 11/14

## 2024-10-27 ENCOUNTER — Ambulatory Visit: Admitting: Pharmacist

## 2025-01-16 ENCOUNTER — Other Ambulatory Visit: Payer: Self-pay
